# Patient Record
Sex: Female | Born: 1935 | Race: White | Hispanic: No | Smoking: Former smoker
Health system: Southern US, Community
[De-identification: ages and names within clinical notes are randomized; demographics above are authoritative.]

## PROBLEM LIST (undated history)

## (undated) DIAGNOSIS — M069 Rheumatoid arthritis, unspecified: Secondary | ICD-10-CM

## (undated) DIAGNOSIS — I639 Cerebral infarction, unspecified: Secondary | ICD-10-CM

## (undated) DIAGNOSIS — I4891 Unspecified atrial fibrillation: Secondary | ICD-10-CM

## (undated) DIAGNOSIS — IMO0002 Reserved for concepts with insufficient information to code with codable children: Secondary | ICD-10-CM

## (undated) DIAGNOSIS — J449 Chronic obstructive pulmonary disease, unspecified: Secondary | ICD-10-CM

## (undated) DIAGNOSIS — J40 Bronchitis, not specified as acute or chronic: Secondary | ICD-10-CM

## (undated) DIAGNOSIS — I509 Heart failure, unspecified: Secondary | ICD-10-CM

## (undated) DIAGNOSIS — I10 Essential (primary) hypertension: Secondary | ICD-10-CM

## (undated) HISTORY — PX: HERNIA REPAIR: SHX51

## (undated) HISTORY — DX: Essential (primary) hypertension: I10

## (undated) HISTORY — DX: Rheumatoid arthritis, unspecified: M06.9

## (undated) HISTORY — PX: CHOLECYSTECTOMY: SHX55

## (undated) HISTORY — PX: KNEE SURGERY: SHX244

## (undated) HISTORY — DX: Reserved for concepts with insufficient information to code with codable children: IMO0002

---

## 2002-02-28 ENCOUNTER — Encounter: Admission: RE | Admit: 2002-02-28 | Discharge: 2002-02-28 | Payer: Self-pay | Admitting: General Surgery

## 2002-02-28 ENCOUNTER — Encounter: Payer: Self-pay | Admitting: General Surgery

## 2002-09-12 ENCOUNTER — Encounter: Payer: Self-pay | Admitting: General Surgery

## 2002-09-12 ENCOUNTER — Encounter: Admission: RE | Admit: 2002-09-12 | Discharge: 2002-09-12 | Payer: Self-pay | Admitting: General Surgery

## 2003-02-19 ENCOUNTER — Encounter: Payer: Self-pay | Admitting: General Surgery

## 2003-02-19 ENCOUNTER — Encounter: Admission: RE | Admit: 2003-02-19 | Discharge: 2003-02-19 | Payer: Self-pay | Admitting: General Surgery

## 2004-01-09 ENCOUNTER — Ambulatory Visit (HOSPITAL_COMMUNITY): Admission: RE | Admit: 2004-01-09 | Discharge: 2004-01-09 | Payer: Self-pay | Admitting: Internal Medicine

## 2004-05-20 ENCOUNTER — Encounter: Admission: RE | Admit: 2004-05-20 | Discharge: 2004-05-20 | Payer: Self-pay | Admitting: Internal Medicine

## 2005-06-01 ENCOUNTER — Encounter: Admission: RE | Admit: 2005-06-01 | Discharge: 2005-06-01 | Payer: Self-pay | Admitting: Internal Medicine

## 2006-03-19 ENCOUNTER — Encounter: Admission: RE | Admit: 2006-03-19 | Discharge: 2006-03-19 | Payer: Self-pay | Admitting: Internal Medicine

## 2006-06-02 ENCOUNTER — Encounter: Admission: RE | Admit: 2006-06-02 | Discharge: 2006-06-02 | Payer: Self-pay | Admitting: Internal Medicine

## 2006-08-26 ENCOUNTER — Inpatient Hospital Stay (HOSPITAL_COMMUNITY): Admission: EM | Admit: 2006-08-26 | Discharge: 2006-08-31 | Payer: Self-pay | Admitting: Emergency Medicine

## 2006-08-26 ENCOUNTER — Encounter (INDEPENDENT_AMBULATORY_CARE_PROVIDER_SITE_OTHER): Payer: Self-pay | Admitting: Specialist

## 2006-08-30 ENCOUNTER — Ambulatory Visit: Payer: Self-pay | Admitting: Internal Medicine

## 2007-06-06 ENCOUNTER — Encounter: Admission: RE | Admit: 2007-06-06 | Discharge: 2007-06-06 | Payer: Self-pay | Admitting: Internal Medicine

## 2009-10-14 ENCOUNTER — Encounter: Admission: RE | Admit: 2009-10-14 | Discharge: 2009-11-04 | Payer: Self-pay | Admitting: Internal Medicine

## 2010-12-19 NOTE — Op Note (Signed)
Debbie Kramer, Debbie Kramer              ACCOUNT NO.:  [id number]   MEDICAL RECORD NO.:  [id number]          PATIENT TYPE:  INP   LOCATION:  5705                         FACILITY:  [facility]   PHYSICIAN:  Wilmon Arms. Corliss Skains, M.D. DATE OF BIRTH:  [birth year]   DATE OF PROCEDURE:  08/26/2006  DATE OF DISCHARGE:                               OPERATIVE REPORT   PREOPERATIVE DIAGNOSIS:  Acute cholecystitis.   POSTOPERATIVE DIAGNOSES:  1. Acute cholecystitis.  2. Choledocholithiasis.   PROCEDURE PERFORMED:  Laparoscopic cholecystectomy with intraoperative  cholangiogram.   SURGEON:  Wilmon Arms. Corliss Skains, M.D.   SUMMARYMarcial Pacas E. Earlene Plater, M.D.   ANESTHESIA:  General endotracheal.   INDICATIONS:  The patient is a 77 year old female who presents with  acute onset of right upper quadrant pain, nausea and vomiting beginning  at about by 3 p.m. yesterday after eating [employer] doughnuts.  She  was evaluated in the emergency department.  An ultrasound showed  cholelithiasis as well as some wall thickening concerning for  cholecystitis.  Her white count was elevated at 14.  Her liver function  tests were normal.  She was admitted to the hospital, placed on  intravenous antibiotics and consented for a laparoscopic  cholecystectomy.   DESCRIPTION OF PROCEDURE:  The patient was brought to the operating room  and placed in the supine position on the operating room table.  After an  adequate level of general anesthesia was obtained, the patient's abdomen  was prepped with Betadine and draped in a sterile fashion.  A time-out  was taken to assure the proper patient and proper procedure.  The area  just above her umbilicus was infiltrated with 0.25% Marcaine.  A  transverse incision was made here.  Dissection was carried down to the  fascia, which was opened vertically.  The peritoneal cavity was entered  bluntly.  There were omental adhesions below the wound from her previous  abdominal hysterectomy.   However, we were able to clear spot into the  right upper quadrant.  The Hasson cannula was inserted and secured with  a stay suture of 0 Vicryl placed in pursestring fashion around the  fascial opening.  Pneumoperitoneum was obtained by insufflating CO2 and  maintaining a maximum pressure of 15 mmHg.  The laparoscope was inserted  into the right upper quadrant.  An inflamed gallbladder was identified  with adherent omentum.  The liver appeared grossly normal.  A 10 mm port  was placed in a subxiphoid position and two 5-mm ports placed in the  right upper quadrant.  The patient was positioned in reverse  Trendelenburg position tilted and slightly to her left.  The gallbladder  was decompressed with the Nezhat aspiration device.  The gallbladder was  grasped with a clamp and elevated off of the edge of the liver.  We  bluntly dissected the adhesions away from the surface of the  gallbladder.  The duodenum was also bluntly dissected away from the  gallbladder.  There was a large stone impacted in the neck of the  gallbladder.  We were able to dissect around the cystic duct  just below  this stone.  Once we had circumferentially dissected around the cystic  duct, we ligated with a clip.  A small opening was created on the cystic  duct just below the clip.  A Cook cholangiogram catheter was inserted  through a stab incision and threaded into the cystic duct.  It was  secured with a clip.  The cholangiogram was obtained.  This showed good  flow into the common bile duct but there was a filling defect which was  identified, which lodged at the ampulla.  Contrast flowed proximally to  the biliary tree but it appeared that there was a filling defect at the  takeoff of the left branch of the hepatic duct.  Contrast flowed into  the right to the side.  Contrast was never seen entering the duodenum.  The catheter was then removed.  The cystic duct was ligated with clips  and divided.  The cystic  artery was also ligated with clips and divided.  Cautery was then used to remove the gallbladder from the liver bed.  This was very difficult due to the edema and inflamed gallbladder.  The  gallbladder was placed in an EndoCatch sac.  We irrigated the right  upper quadrant.  The gallbladder fossa was thoroughly cauterized for  hemostasis.  The irrigant was suctioned out.  The gallbladder was then  removed through the umbilical port site.  We had to enlarge the fascia  slightly to allow removal of the very large gallbladder filled with  stones.  The fascia was closed with a stay suture.  Monocryl 4-0 was  used to close all the skin incisions after releasing pneumoperitoneum  and removing the port sites.  Steri-Strips and clean dressings were  applied.  The patient was extubated and brought to recovery in stable  condition.  We will consult gastroenterology for a postoperative ERCP to  clear the common bile duct.      Wilmon Arms. Tsuei, M.D.  Electronically Signed     MKT/MEDQ  D:  08/26/2006  T:  08/26/2006  Job:  [id number]

## 2010-12-19 NOTE — Discharge Summary (Signed)
NAMETRICA, USERY NO.:  [id number]   MEDICAL RECORD NO.:  [id number]          PATIENT TYPE:  INP   LOCATION:  5705                         FACILITY:  [facility]   PHYSICIAN:  Leonie Man, M.D.   DATE OF BIRTH:  05/29/1926   DATE OF ADMISSION:  08/25/2006  DATE OF DISCHARGE:  08/31/2006                               DISCHARGE SUMMARY   DISCHARGING PHYSICIAN:  Dr. Lurene Shadow.   CHIEF COMPLAINT/REASON FOR ADMISSION:  Ms. Sandiford is a 77 year old  female patient, developed abdominal pain in the right upper abdomen, not  relieved with Tums.  She began having nausea and vomiting.  She  presented to the ER at 8:00 p.m. on the 22nd.  Evaluation there by the  ER physician was consistent with probable acute cholecystitis.  White  count was elevated at 14,000 with a left shift.  Her lipase was normal.  LFTs were pending at time of admission.  An ultrasound was done that  showed the gallbladder wall thickening and cholelithiasis and possible  cholecystitis, without any ductal dilatation.  On exam, she was  afebrile.  Her abdomen was soft without any herniation.  She had  tenderness and guarding in the right upper quadrant.  She was admitted  by Dr. Michaell Cowing with the following diagnoses.  1. Probable acute cholecystitis with cholelithiasis.  2. History of hypertension.  3. History of tobacco abuse.  4. Prior multiple abdominal surgeries.   HOSPITAL COURSE:  The patient was admitted by Dr. Michaell Cowing on the 23rd late  in the evening and was admitted to the surgical floor on August 26, 2006.  She was taken to the OR by Dr. Manus Rudd, with a preoperative  diagnosis of acute cholecystitis, postoperative diagnosis acute  cholecystitis and choledocholithiasis.  She underwent a laparoscopic  cholecystectomy with an intraoperative cholangiogram showing stones in  the common bile duct.  Because of this, gastroenterology consult was  obtained in the preoperative period, as well as the  postoperative  period.  The patient had been started on Unisom IV for cholecystitis.  She was also given Dilaudid and Toradol for pain.   On August 26, 2006 in the afternoon, GI evaluated the patient, but the  patient was appropriate for ERCP procedure, and on August 27, 2006, the  patient did undergo ERCP.  They were unsuccessful on the first attempt  on August 27, 2006, so patient remained in the hospital.  The limited  films from the initial ERCP showed that there was still possibly some  sort of density or object/mass within the common bile duct; therefore an  MRCP was ordered.  This showed a stone and a filling defect in the  common bile duct.  Therefore, the ERCP was repeated on August 30, 2006.  This time, they was successful with the biliary sphincterotomy and  sweeping of the bile duct stone.  The patient tolerated the procedure  well.  Diet was advanced, and by August 31, 2006 the patient was deemed  appropriate for discharge home by gastroenterology services.  Her BMET  was normal.  Her AST was mildly elevated at  84 with an ALT of 112.  White count was 7,700.  Follow up with gastroenterology as needed.   DISCHARGE DIAGNOSES:  1. Acute cholecystitis with cholelithiasis.  2. Status post laparoscopic cholecystectomy with abnormal      intraoperative cholangiogram.  3. Choledocholithiasis status post ERCP with sphincterotomy and stone      retrieval.  4. Hypertension, stable.  5. Leukocytosis, resolved.   DISCHARGE MEDICATIONS:  1. The patient will resume the home medications.  2. Vicodin as needed for pain.   DISCHARGE INSTRUCTIONS:  The patient has been given the [facility] home care instructions for laparoscopic cholecystectomy,  and she will be following these instructions as written.  She is to  follow up with Dr. Corliss Skains in 2-3 weeks in the office.  She needs to call  for appointment.      Allison L. Rennis Harding, N.P.      Leonie Man, M.D.   Electronically Signed    ALE/MEDQ  D:  10/08/2006  T:  10/08/2006  Job:  [id number]   cc:   Rachael Fee, MD  Gaspar Garbe, M.D.

## 2010-12-19 NOTE — H&P (Signed)
Debbie Kramer, Debbie Kramer NO.:  [id number]   MEDICAL RECORD NO.:  [id number]          PATIENT TYPE:  INP   LOCATION:  5705                         FACILITY:  [facility]   PHYSICIAN:  Ardeth Sportsman, MD     DATE OF BIRTH:  25-Sep-1925   DATE OF ADMISSION:  08/25/2006  DATE OF DISCHARGE:                              HISTORY & PHYSICAL   PRIMARY CARE PHYSICIAN:  Guerry Bruin, M.D.   REQUESTING PHYSICIAN:  Dione Booze, M.D.   SURGEON:  Karie Soda, M.D., for Wilmon Arms. Tsuei, M.D.   CHIEF COMPLAINT:  Abdominal pain, probable cholecystitis.   HISTORY OF PRESENT ILLNESS:  Debbie Kramer is a pleasant 77 year old  female, who is otherwise pretty healthy, who has never had really any  significant abdominal pain before in the past.  She noted yesterday  after having somewhat of a heavy meal in the afternoon around 3:00  (doughnut as well as a clam chowder) that she started to have some  abdominal pain.  She notes that is primarily in the upper abdomen, right  greater than left and somewhat focused epigastric as well.  The pain  worsened.  She took four Tums with no help.  She tried some Pepto-  Bismol, threw that up, felt nauseated, and did some dry heaving.  No  hematemesis, no hematochezia or melena.  No diarrhea.  Normally, she has  a bowel movement about every day.  No sick contacts or travel history.  No other significant change in her diet.  She has never had anything  like this before.  No history really of any significant reflux or  heartburn or hiatal hernia that she can recall.   Because the pain did not improve, she came to the ER around 8:00 last  night, was evaluated, and the concern was that she has cholecystitis.  Her abdominal pain, although improved with pain medication, has not  totally gone away.   PAST MEDICAL HISTORY:  1. Hypertension .  2. Osteoarthritis.  3. Osteoporosis.  4. Question of diverticulitis in the past.  She has never had a  colonoscopy.  5. Tobacco abuse.  6. It sounds like she had had worms when she was a child in the      gastroenterological system, but she has not had any problems like      that in over 70 years.   PAST SURGICAL HISTORY:  1. She had an abdominal hysterectomy about 35 years ago.  2. She had a right total knee replacement about 12 years ago.  3. A left total knee replacement about 10 or 11 years ago.   SOCIAL HISTORY:  She has probably about a 40-pack year history of  tobacco and currently smokes about half a pack per day.  No other  alcohol or drug use.  She is married.  I think she is the primary  caregiver for her husband, who apparently has some increased health  needs.  Her daughter is here today with her.   FAMILY HISTORY:  She cannot recall any significant gallbladder, foregut,  or gastrointestinal problems in her family.  Otherwise, no definite  history of any diabetes, but she thinks her parents had heart troubles.   ALLERGIES:  None.   MEDICATIONS:  She takes benazepril, calcium, Celebrex, Enbrel, Fosamax,  multivitamins, and Tylenol.  She denies taking any aspirin, Plavix,  Coumadin, or other blood thinners.  Denies taking any herbal  supplements.   REVIEW OF SYSTEMS:  CONSTITUTIONAL:  No fevers, chills or sweats.  Otherwise, negative.  OPHTHALMOLOGIC:  She wears glasses, but that has  not changed at all recently.  Her vision has been relatively stable.  Otherwise, negative.  HEENT is essentially negative.  CARDIAC:  She can  walk at least a couple of blocks.  No exertional chest pain or angina.  She does not try to use stairs very much.  She is usually pretty  physically active, out doing daily stuff and taking care of her husband.  Otherwise, cardiac is negative.  RESPIRATORY:  No colds, coughs or flus.  No hemoptysis or productive sputum.  Otherwise, negative.  GI:  As noted  above.  Otherwise, negative.  GU is negative for any significant urinary  symptoms.  GYN:   Status post hysterectomy.  No vaginal bleeding or  discharge.  HEME/LYMPH:  Negative.  PSYCH:  Negative.  NEUROLOGICAL:  Negative.  SKIN:  Negative.  HEPATIC/ RENAL/ENDOCRINE:  All negative.   VITAL SIGNS:  Her temperature is 98.6.  Blood pressure of initially  207/89, now 138/72.  Initial pain was around 7/10 and now it is down to  about 2-3/10.  It is not 0/10 as the ER notes.  She is 91% on room air.  Respirations around 18.   PHYSICAL EXAM:  GENERAL:  She is a well-developed, well-nourished  female.  She does not look toxic and does smile, but she is not totally  comfortable.  PSYCH:  She is pleasant and interactive with no evidence of any  dementia, psychosis or paranoia.  EYES:  Pupils equal round, reactive to light.  Extraocular movements are  intact.  Sclerae not particularly jaundiced or icteric or injected.  HEENT:  She is normocephalic.  Her mucous membranes are dry, but her  nasopharynx and oropharynx are clear.  She has no facial asymmetry.  NECK:  Supple without any masses.  Trachea is midline.  No obvious  thyroid masses.  HEART:  Regular rate and rhythm.  No murmurs, clicks, rubs.  She has no  JVD.  She has no pedal edema.  LUNGS:  Clear to auscultation bilaterally.  No wheezes, rales, rhonchi.  No significant pain on rib or sternal compression.  ABDOMEN:  Soft, not particularly distended.  She has no umbilical  hernia.  She does have pretty good tenderness and guarding in her right  upper quadrant, especially unilaterally.  She does not have the classic  Murphy sign, but she is pretty close to it.  GENITAL:  Normal external female genitalia.  No inguinal hernias.  Rectal is deferred per patient request.  MUSCULOSKELETAL:  She has incisions over both knees consistent with open  total knee replacements, but otherwise she has full range of motion of  her shoulders, elbows, wrists as well as hips, knees, and ankles.  No significant pain along her cervical, thoraco,  lumbosacral spine.  SKIN:  No obvious petechiae.  No purpura .  No major sores or lesions.  LYMPH:  No head, neck, axillary, or groin lymphadenopathy.   STUDIES:  Her white count is 14, which is elevated and she does have a  left-sided  shift.  Hemoglobin of 14.7.  Chemistries are within normal  range.  Potassium is 3.7, creatinine of 0.7.  I do not have a total  bilirubin on her, but her albumin is 3.9 and lipase is normal.   X-ray:  She has a 3-way of the abdomen, which shows her lungs are pretty  clear and unremarkable and her abdominal films are unremarkable as well.  She has an ultrasound, which preliminary report shows gallbladder wall  thickening and stones concerning for cholecystitis.  I do not see any  obvious pericholecystic fluid, and the common bile duct does not appear  to be dilated.   ASSESSMENT AND PLAN:  This is a 77 year old female with probable acute  cholecystitis.   I had discussions with the patient as well as the patient's daughter.  The anatomy and physiology of hepatobiliary and pancreatic function was  explained.  Path of physiology of cholecystolithiasis with its risks of  cholecystitis, gallstones, pancreatitis, etc., were explained.  Given  her history and exam and the fact that pain has persisted over 12 hours  since her attack, she does have elevated white, and her ultrasound is  concerning as well, I believe she has cholecystitis.  Options were  discussed and given that I think she would tolerate a surgery well and  just does not have any immediate contraindications, a recommendation is  made for laparoscopic cholecystectomy with possible intraoperative  cholangiogram.   Risks such as stroke, heart attack, deep venous thrombosis, pulmonary  embolism, and death are discussed.  Risks such as bleeding, need for  transfusion, hematoma, ecchymosis, wound infection, abscess, injury to  other organs, incisional hernia, bile duct injury resulting in need of   internal/external drainage and/or operative reconstruction, and other  risks were discussed.  Overall, I think the benefits of having her  gallbladder removed in a timely fashion outweigh the risks.  They were  hoping to may be delay this, but after talking with them and noting that  pain persists and she has multiple signs and exam is concerning for  cholecystitis, she and the daughter agreed to get it done.   PLAN:  We will, therefore:  1. Admit.  2. Intravenous Unasyn.  3. EKG for completion of workup  4. Laparoscopic cholecystectomy as noted above.      Ardeth Sportsman, MD  Electronically Signed     SCG/MEDQ  D:  08/26/2006  T:  08/26/2006  Job:  [id number]

## 2011-03-04 ENCOUNTER — Encounter: Payer: Self-pay | Admitting: Podiatry

## 2011-03-04 DIAGNOSIS — IMO0002 Reserved for concepts with insufficient information to code with codable children: Secondary | ICD-10-CM

## 2011-03-04 DIAGNOSIS — M069 Rheumatoid arthritis, unspecified: Secondary | ICD-10-CM | POA: Insufficient documentation

## 2011-03-04 DIAGNOSIS — M1990 Unspecified osteoarthritis, unspecified site: Secondary | ICD-10-CM | POA: Insufficient documentation

## 2011-09-17 DIAGNOSIS — M069 Rheumatoid arthritis, unspecified: Secondary | ICD-10-CM | POA: Diagnosis not present

## 2011-09-17 DIAGNOSIS — M159 Polyosteoarthritis, unspecified: Secondary | ICD-10-CM | POA: Diagnosis not present

## 2011-09-17 DIAGNOSIS — M810 Age-related osteoporosis without current pathological fracture: Secondary | ICD-10-CM | POA: Diagnosis not present

## 2011-09-17 DIAGNOSIS — Z79899 Other long term (current) drug therapy: Secondary | ICD-10-CM | POA: Diagnosis not present

## 2011-09-17 DIAGNOSIS — M545 Low back pain: Secondary | ICD-10-CM | POA: Diagnosis not present

## 2011-10-02 DIAGNOSIS — E785 Hyperlipidemia, unspecified: Secondary | ICD-10-CM | POA: Diagnosis not present

## 2011-10-02 DIAGNOSIS — J069 Acute upper respiratory infection, unspecified: Secondary | ICD-10-CM | POA: Diagnosis not present

## 2011-10-02 DIAGNOSIS — I10 Essential (primary) hypertension: Secondary | ICD-10-CM | POA: Diagnosis not present

## 2011-10-02 DIAGNOSIS — M810 Age-related osteoporosis without current pathological fracture: Secondary | ICD-10-CM | POA: Diagnosis not present

## 2011-12-14 DIAGNOSIS — L570 Actinic keratosis: Secondary | ICD-10-CM | POA: Diagnosis not present

## 2012-03-16 DIAGNOSIS — M25529 Pain in unspecified elbow: Secondary | ICD-10-CM | POA: Diagnosis not present

## 2012-03-16 DIAGNOSIS — M159 Polyosteoarthritis, unspecified: Secondary | ICD-10-CM | POA: Diagnosis not present

## 2012-03-16 DIAGNOSIS — M810 Age-related osteoporosis without current pathological fracture: Secondary | ICD-10-CM | POA: Diagnosis not present

## 2012-03-16 DIAGNOSIS — M545 Low back pain: Secondary | ICD-10-CM | POA: Diagnosis not present

## 2012-03-16 DIAGNOSIS — M069 Rheumatoid arthritis, unspecified: Secondary | ICD-10-CM | POA: Diagnosis not present

## 2012-04-06 DIAGNOSIS — E785 Hyperlipidemia, unspecified: Secondary | ICD-10-CM | POA: Diagnosis not present

## 2012-04-06 DIAGNOSIS — I10 Essential (primary) hypertension: Secondary | ICD-10-CM | POA: Diagnosis not present

## 2012-04-06 DIAGNOSIS — M810 Age-related osteoporosis without current pathological fracture: Secondary | ICD-10-CM | POA: Diagnosis not present

## 2012-04-13 DIAGNOSIS — Z0000 Encounter for general adult medical examination without abnormal findings: Secondary | ICD-10-CM | POA: Diagnosis not present

## 2012-04-13 DIAGNOSIS — I10 Essential (primary) hypertension: Secondary | ICD-10-CM | POA: Diagnosis not present

## 2012-04-13 DIAGNOSIS — E785 Hyperlipidemia, unspecified: Secondary | ICD-10-CM | POA: Diagnosis not present

## 2012-04-13 DIAGNOSIS — E875 Hyperkalemia: Secondary | ICD-10-CM | POA: Diagnosis not present

## 2012-04-13 DIAGNOSIS — Z23 Encounter for immunization: Secondary | ICD-10-CM | POA: Diagnosis not present

## 2012-04-14 DIAGNOSIS — Z1212 Encounter for screening for malignant neoplasm of rectum: Secondary | ICD-10-CM | POA: Diagnosis not present

## 2012-08-02 DIAGNOSIS — Z79899 Other long term (current) drug therapy: Secondary | ICD-10-CM | POA: Diagnosis not present

## 2012-09-20 DIAGNOSIS — M069 Rheumatoid arthritis, unspecified: Secondary | ICD-10-CM | POA: Diagnosis not present

## 2012-09-20 DIAGNOSIS — M810 Age-related osteoporosis without current pathological fracture: Secondary | ICD-10-CM | POA: Diagnosis not present

## 2012-09-20 DIAGNOSIS — M545 Low back pain: Secondary | ICD-10-CM | POA: Diagnosis not present

## 2012-09-20 DIAGNOSIS — M159 Polyosteoarthritis, unspecified: Secondary | ICD-10-CM | POA: Diagnosis not present

## 2012-09-30 DIAGNOSIS — M069 Rheumatoid arthritis, unspecified: Secondary | ICD-10-CM | POA: Diagnosis not present

## 2012-10-12 DIAGNOSIS — R809 Proteinuria, unspecified: Secondary | ICD-10-CM | POA: Diagnosis not present

## 2012-10-12 DIAGNOSIS — M810 Age-related osteoporosis without current pathological fracture: Secondary | ICD-10-CM | POA: Diagnosis not present

## 2012-10-12 DIAGNOSIS — Z79899 Other long term (current) drug therapy: Secondary | ICD-10-CM | POA: Diagnosis not present

## 2012-10-12 DIAGNOSIS — I452 Bifascicular block: Secondary | ICD-10-CM | POA: Diagnosis not present

## 2012-10-12 DIAGNOSIS — N182 Chronic kidney disease, stage 2 (mild): Secondary | ICD-10-CM | POA: Diagnosis not present

## 2012-10-12 DIAGNOSIS — E785 Hyperlipidemia, unspecified: Secondary | ICD-10-CM | POA: Diagnosis not present

## 2012-10-12 DIAGNOSIS — Z1331 Encounter for screening for depression: Secondary | ICD-10-CM | POA: Diagnosis not present

## 2012-10-12 DIAGNOSIS — M069 Rheumatoid arthritis, unspecified: Secondary | ICD-10-CM | POA: Diagnosis not present

## 2012-10-17 DIAGNOSIS — M069 Rheumatoid arthritis, unspecified: Secondary | ICD-10-CM | POA: Diagnosis not present

## 2012-11-15 DIAGNOSIS — M069 Rheumatoid arthritis, unspecified: Secondary | ICD-10-CM | POA: Diagnosis not present

## 2013-01-10 DIAGNOSIS — M069 Rheumatoid arthritis, unspecified: Secondary | ICD-10-CM | POA: Diagnosis not present

## 2013-03-06 DIAGNOSIS — M069 Rheumatoid arthritis, unspecified: Secondary | ICD-10-CM | POA: Diagnosis not present

## 2013-03-22 DIAGNOSIS — M545 Low back pain: Secondary | ICD-10-CM | POA: Diagnosis not present

## 2013-03-22 DIAGNOSIS — M810 Age-related osteoporosis without current pathological fracture: Secondary | ICD-10-CM | POA: Diagnosis not present

## 2013-03-22 DIAGNOSIS — M159 Polyosteoarthritis, unspecified: Secondary | ICD-10-CM | POA: Diagnosis not present

## 2013-03-22 DIAGNOSIS — M069 Rheumatoid arthritis, unspecified: Secondary | ICD-10-CM | POA: Diagnosis not present

## 2013-04-07 DIAGNOSIS — I10 Essential (primary) hypertension: Secondary | ICD-10-CM | POA: Diagnosis not present

## 2013-04-07 DIAGNOSIS — E785 Hyperlipidemia, unspecified: Secondary | ICD-10-CM | POA: Diagnosis not present

## 2013-04-14 DIAGNOSIS — I10 Essential (primary) hypertension: Secondary | ICD-10-CM | POA: Diagnosis not present

## 2013-04-14 DIAGNOSIS — M069 Rheumatoid arthritis, unspecified: Secondary | ICD-10-CM | POA: Diagnosis not present

## 2013-04-14 DIAGNOSIS — R7309 Other abnormal glucose: Secondary | ICD-10-CM | POA: Diagnosis not present

## 2013-04-14 DIAGNOSIS — E785 Hyperlipidemia, unspecified: Secondary | ICD-10-CM | POA: Diagnosis not present

## 2013-04-14 DIAGNOSIS — I452 Bifascicular block: Secondary | ICD-10-CM | POA: Diagnosis not present

## 2013-04-14 DIAGNOSIS — Z23 Encounter for immunization: Secondary | ICD-10-CM | POA: Diagnosis not present

## 2013-04-14 DIAGNOSIS — Z0000 Encounter for general adult medical examination without abnormal findings: Secondary | ICD-10-CM | POA: Diagnosis not present

## 2013-04-14 DIAGNOSIS — M810 Age-related osteoporosis without current pathological fracture: Secondary | ICD-10-CM | POA: Diagnosis not present

## 2013-04-14 DIAGNOSIS — R809 Proteinuria, unspecified: Secondary | ICD-10-CM | POA: Diagnosis not present

## 2013-04-17 DIAGNOSIS — Z1212 Encounter for screening for malignant neoplasm of rectum: Secondary | ICD-10-CM | POA: Diagnosis not present

## 2013-05-02 DIAGNOSIS — M069 Rheumatoid arthritis, unspecified: Secondary | ICD-10-CM | POA: Diagnosis not present

## 2013-06-27 DIAGNOSIS — M069 Rheumatoid arthritis, unspecified: Secondary | ICD-10-CM | POA: Diagnosis not present

## 2013-07-20 DIAGNOSIS — M810 Age-related osteoporosis without current pathological fracture: Secondary | ICD-10-CM | POA: Diagnosis not present

## 2013-07-20 DIAGNOSIS — M545 Low back pain: Secondary | ICD-10-CM | POA: Diagnosis not present

## 2013-07-20 DIAGNOSIS — M069 Rheumatoid arthritis, unspecified: Secondary | ICD-10-CM | POA: Diagnosis not present

## 2013-07-20 DIAGNOSIS — M159 Polyosteoarthritis, unspecified: Secondary | ICD-10-CM | POA: Diagnosis not present

## 2013-08-22 DIAGNOSIS — M069 Rheumatoid arthritis, unspecified: Secondary | ICD-10-CM | POA: Diagnosis not present

## 2013-10-16 DIAGNOSIS — Z79899 Other long term (current) drug therapy: Secondary | ICD-10-CM | POA: Diagnosis not present

## 2013-10-16 DIAGNOSIS — E785 Hyperlipidemia, unspecified: Secondary | ICD-10-CM | POA: Diagnosis not present

## 2013-10-16 DIAGNOSIS — M810 Age-related osteoporosis without current pathological fracture: Secondary | ICD-10-CM | POA: Diagnosis not present

## 2013-10-16 DIAGNOSIS — Z1331 Encounter for screening for depression: Secondary | ICD-10-CM | POA: Diagnosis not present

## 2013-10-16 DIAGNOSIS — N182 Chronic kidney disease, stage 2 (mild): Secondary | ICD-10-CM | POA: Diagnosis not present

## 2013-10-16 DIAGNOSIS — M069 Rheumatoid arthritis, unspecified: Secondary | ICD-10-CM | POA: Diagnosis not present

## 2013-10-16 DIAGNOSIS — R809 Proteinuria, unspecified: Secondary | ICD-10-CM | POA: Diagnosis not present

## 2013-10-16 DIAGNOSIS — I10 Essential (primary) hypertension: Secondary | ICD-10-CM | POA: Diagnosis not present

## 2013-10-17 DIAGNOSIS — M069 Rheumatoid arthritis, unspecified: Secondary | ICD-10-CM | POA: Diagnosis not present

## 2013-11-08 DIAGNOSIS — M545 Low back pain, unspecified: Secondary | ICD-10-CM | POA: Diagnosis not present

## 2013-11-08 DIAGNOSIS — M069 Rheumatoid arthritis, unspecified: Secondary | ICD-10-CM | POA: Diagnosis not present

## 2013-11-08 DIAGNOSIS — M159 Polyosteoarthritis, unspecified: Secondary | ICD-10-CM | POA: Diagnosis not present

## 2013-11-08 DIAGNOSIS — M810 Age-related osteoporosis without current pathological fracture: Secondary | ICD-10-CM | POA: Diagnosis not present

## 2013-12-12 DIAGNOSIS — M069 Rheumatoid arthritis, unspecified: Secondary | ICD-10-CM | POA: Diagnosis not present

## 2014-02-06 DIAGNOSIS — M069 Rheumatoid arthritis, unspecified: Secondary | ICD-10-CM | POA: Diagnosis not present

## 2014-02-07 DIAGNOSIS — M545 Low back pain, unspecified: Secondary | ICD-10-CM | POA: Diagnosis not present

## 2014-02-07 DIAGNOSIS — M810 Age-related osteoporosis without current pathological fracture: Secondary | ICD-10-CM | POA: Diagnosis not present

## 2014-02-07 DIAGNOSIS — M069 Rheumatoid arthritis, unspecified: Secondary | ICD-10-CM | POA: Diagnosis not present

## 2014-02-07 DIAGNOSIS — M159 Polyosteoarthritis, unspecified: Secondary | ICD-10-CM | POA: Diagnosis not present

## 2014-03-22 DIAGNOSIS — H35369 Drusen (degenerative) of macula, unspecified eye: Secondary | ICD-10-CM | POA: Diagnosis not present

## 2014-03-22 DIAGNOSIS — H2510 Age-related nuclear cataract, unspecified eye: Secondary | ICD-10-CM | POA: Diagnosis not present

## 2014-03-22 DIAGNOSIS — H43819 Vitreous degeneration, unspecified eye: Secondary | ICD-10-CM | POA: Diagnosis not present

## 2014-04-03 DIAGNOSIS — M069 Rheumatoid arthritis, unspecified: Secondary | ICD-10-CM | POA: Diagnosis not present

## 2014-04-16 DIAGNOSIS — E785 Hyperlipidemia, unspecified: Secondary | ICD-10-CM | POA: Diagnosis not present

## 2014-04-16 DIAGNOSIS — R809 Proteinuria, unspecified: Secondary | ICD-10-CM | POA: Diagnosis not present

## 2014-04-16 DIAGNOSIS — I10 Essential (primary) hypertension: Secondary | ICD-10-CM | POA: Diagnosis not present

## 2014-04-16 DIAGNOSIS — M810 Age-related osteoporosis without current pathological fracture: Secondary | ICD-10-CM | POA: Diagnosis not present

## 2014-04-23 DIAGNOSIS — E785 Hyperlipidemia, unspecified: Secondary | ICD-10-CM | POA: Diagnosis not present

## 2014-04-23 DIAGNOSIS — M810 Age-related osteoporosis without current pathological fracture: Secondary | ICD-10-CM | POA: Diagnosis not present

## 2014-04-23 DIAGNOSIS — R809 Proteinuria, unspecified: Secondary | ICD-10-CM | POA: Diagnosis not present

## 2014-04-23 DIAGNOSIS — Z1331 Encounter for screening for depression: Secondary | ICD-10-CM | POA: Diagnosis not present

## 2014-04-23 DIAGNOSIS — I452 Bifascicular block: Secondary | ICD-10-CM | POA: Diagnosis not present

## 2014-04-23 DIAGNOSIS — N182 Chronic kidney disease, stage 2 (mild): Secondary | ICD-10-CM | POA: Diagnosis not present

## 2014-04-23 DIAGNOSIS — R7301 Impaired fasting glucose: Secondary | ICD-10-CM | POA: Diagnosis not present

## 2014-04-23 DIAGNOSIS — Z23 Encounter for immunization: Secondary | ICD-10-CM | POA: Diagnosis not present

## 2014-04-23 DIAGNOSIS — Z0000 Encounter for general adult medical examination without abnormal findings: Secondary | ICD-10-CM | POA: Diagnosis not present

## 2014-04-23 DIAGNOSIS — Z79899 Other long term (current) drug therapy: Secondary | ICD-10-CM | POA: Diagnosis not present

## 2014-04-25 DIAGNOSIS — Z1212 Encounter for screening for malignant neoplasm of rectum: Secondary | ICD-10-CM | POA: Diagnosis not present

## 2014-05-28 DIAGNOSIS — M0589 Other rheumatoid arthritis with rheumatoid factor of multiple sites: Secondary | ICD-10-CM | POA: Diagnosis not present

## 2014-06-12 DIAGNOSIS — M150 Primary generalized (osteo)arthritis: Secondary | ICD-10-CM | POA: Diagnosis not present

## 2014-06-12 DIAGNOSIS — M810 Age-related osteoporosis without current pathological fracture: Secondary | ICD-10-CM | POA: Diagnosis not present

## 2014-06-12 DIAGNOSIS — M545 Low back pain: Secondary | ICD-10-CM | POA: Diagnosis not present

## 2014-06-12 DIAGNOSIS — M0589 Other rheumatoid arthritis with rheumatoid factor of multiple sites: Secondary | ICD-10-CM | POA: Diagnosis not present

## 2014-07-23 DIAGNOSIS — M0589 Other rheumatoid arthritis with rheumatoid factor of multiple sites: Secondary | ICD-10-CM | POA: Diagnosis not present

## 2014-09-20 DIAGNOSIS — M0589 Other rheumatoid arthritis with rheumatoid factor of multiple sites: Secondary | ICD-10-CM | POA: Diagnosis not present

## 2014-10-10 DIAGNOSIS — M810 Age-related osteoporosis without current pathological fracture: Secondary | ICD-10-CM | POA: Diagnosis not present

## 2014-10-10 DIAGNOSIS — M545 Low back pain: Secondary | ICD-10-CM | POA: Diagnosis not present

## 2014-10-10 DIAGNOSIS — M0589 Other rheumatoid arthritis with rheumatoid factor of multiple sites: Secondary | ICD-10-CM | POA: Diagnosis not present

## 2014-10-10 DIAGNOSIS — M150 Primary generalized (osteo)arthritis: Secondary | ICD-10-CM | POA: Diagnosis not present

## 2014-10-22 DIAGNOSIS — M069 Rheumatoid arthritis, unspecified: Secondary | ICD-10-CM | POA: Diagnosis not present

## 2014-10-22 DIAGNOSIS — E785 Hyperlipidemia, unspecified: Secondary | ICD-10-CM | POA: Diagnosis not present

## 2014-10-22 DIAGNOSIS — R7301 Impaired fasting glucose: Secondary | ICD-10-CM | POA: Diagnosis not present

## 2014-10-22 DIAGNOSIS — I444 Left anterior fascicular block: Secondary | ICD-10-CM | POA: Diagnosis not present

## 2014-10-22 DIAGNOSIS — N182 Chronic kidney disease, stage 2 (mild): Secondary | ICD-10-CM | POA: Diagnosis not present

## 2014-10-22 DIAGNOSIS — I10 Essential (primary) hypertension: Secondary | ICD-10-CM | POA: Diagnosis not present

## 2014-10-22 DIAGNOSIS — R809 Proteinuria, unspecified: Secondary | ICD-10-CM | POA: Diagnosis not present

## 2014-10-22 DIAGNOSIS — M810 Age-related osteoporosis without current pathological fracture: Secondary | ICD-10-CM | POA: Diagnosis not present

## 2014-10-23 ENCOUNTER — Emergency Department (HOSPITAL_COMMUNITY): Payer: Medicare Other

## 2014-10-23 ENCOUNTER — Encounter (HOSPITAL_COMMUNITY): Payer: Self-pay | Admitting: Neurology

## 2014-10-23 ENCOUNTER — Inpatient Hospital Stay (HOSPITAL_COMMUNITY): Payer: Medicare Other

## 2014-10-23 ENCOUNTER — Inpatient Hospital Stay (HOSPITAL_COMMUNITY)
Admission: EM | Admit: 2014-10-23 | Discharge: 2014-10-25 | DRG: 066 | Disposition: A | Discharge status: Home / Self Care | Payer: Medicare Other | Attending: Internal Medicine | Admitting: Internal Medicine

## 2014-10-23 DIAGNOSIS — Z9049 Acquired absence of other specified parts of digestive tract: Secondary | ICD-10-CM | POA: Diagnosis present

## 2014-10-23 DIAGNOSIS — Z720 Tobacco use: Secondary | ICD-10-CM | POA: Diagnosis not present

## 2014-10-23 DIAGNOSIS — R410 Disorientation, unspecified: Secondary | ICD-10-CM | POA: Diagnosis not present

## 2014-10-23 DIAGNOSIS — I639 Cerebral infarction, unspecified: Secondary | ICD-10-CM

## 2014-10-23 DIAGNOSIS — I10 Essential (primary) hypertension: Secondary | ICD-10-CM | POA: Diagnosis not present

## 2014-10-23 DIAGNOSIS — I63431 Cerebral infarction due to embolism of right posterior cerebral artery: Secondary | ICD-10-CM | POA: Diagnosis not present

## 2014-10-23 DIAGNOSIS — F17210 Nicotine dependence, cigarettes, uncomplicated: Secondary | ICD-10-CM | POA: Diagnosis present

## 2014-10-23 DIAGNOSIS — R7989 Other specified abnormal findings of blood chemistry: Secondary | ICD-10-CM | POA: Diagnosis present

## 2014-10-23 DIAGNOSIS — M069 Rheumatoid arthritis, unspecified: Secondary | ICD-10-CM | POA: Diagnosis not present

## 2014-10-23 DIAGNOSIS — R4182 Altered mental status, unspecified: Secondary | ICD-10-CM | POA: Diagnosis not present

## 2014-10-23 DIAGNOSIS — E785 Hyperlipidemia, unspecified: Secondary | ICD-10-CM | POA: Diagnosis present

## 2014-10-23 DIAGNOSIS — I638 Other cerebral infarction: Secondary | ICD-10-CM | POA: Diagnosis not present

## 2014-10-23 DIAGNOSIS — I6330 Cerebral infarction due to thrombosis of unspecified cerebral artery: Secondary | ICD-10-CM | POA: Diagnosis not present

## 2014-10-23 DIAGNOSIS — Z66 Do not resuscitate: Secondary | ICD-10-CM | POA: Diagnosis present

## 2014-10-23 DIAGNOSIS — R9431 Abnormal electrocardiogram [ECG] [EKG]: Secondary | ICD-10-CM | POA: Diagnosis not present

## 2014-10-23 LAB — URINALYSIS, ROUTINE W REFLEX MICROSCOPIC
BILIRUBIN URINE: NEGATIVE
Glucose, UA: NEGATIVE mg/dL
Hgb urine dipstick: NEGATIVE
Ketones, ur: 15 mg/dL — AB
Leukocytes, UA: NEGATIVE
Nitrite: NEGATIVE
PROTEIN: NEGATIVE mg/dL
Specific Gravity, Urine: 1.007 (ref 1.005–1.030)
UROBILINOGEN UA: 0.2 mg/dL (ref 0.0–1.0)
pH: 7 (ref 5.0–8.0)

## 2014-10-23 LAB — PROTIME-INR
INR: 1.01 (ref 0.00–1.49)
Prothrombin Time: 13.5 seconds (ref 11.6–15.2)

## 2014-10-23 LAB — CBC
HCT: 46.8 % — ABNORMAL HIGH (ref 36.0–46.0)
HEMATOCRIT: 45.4 % (ref 36.0–46.0)
HEMOGLOBIN: 15.7 g/dL — AB (ref 12.0–15.0)
Hemoglobin: 16.2 g/dL — ABNORMAL HIGH (ref 12.0–15.0)
MCH: 33.1 pg (ref 26.0–34.0)
MCH: 32.2 pg (ref 26.0–34.0)
MCHC: 34.6 g/dL (ref 30.0–36.0)
MCHC: 34.6 g/dL (ref 30.0–36.0)
MCV: 95.5 fL (ref 78.0–100.0)
MCV: 93 fL (ref 78.0–100.0)
Platelets: 234 10*3/uL (ref 150–400)
Platelets: 212 10*3/uL (ref 150–400)
RBC: 4.9 MIL/uL (ref 3.87–5.11)
RBC: 4.88 MIL/uL (ref 3.87–5.11)
RDW: 14.7 % (ref 11.5–15.5)
RDW: 14.5 % (ref 11.5–15.5)
WBC: 7.1 10*3/uL (ref 4.0–10.5)
WBC: 6.7 10*3/uL (ref 4.0–10.5)

## 2014-10-23 LAB — COMPREHENSIVE METABOLIC PANEL
ALT: 21 U/L (ref 0–35)
AST: 30 U/L (ref 0–37)
Albumin: 3.9 g/dL (ref 3.5–5.2)
Alkaline Phosphatase: 61 U/L (ref 39–117)
Anion gap: 10 (ref 5–15)
BUN: 11 mg/dL (ref 6–23)
CO2: 28 mmol/L (ref 19–32)
Calcium: 9.5 mg/dL (ref 8.4–10.5)
Chloride: 97 mmol/L (ref 96–112)
Creatinine, Ser: 0.75 mg/dL (ref 0.50–1.10)
GFR calc Af Amer: 86 mL/min — ABNORMAL LOW (ref >90)
GFR calc non Af Amer: 74 mL/min — ABNORMAL LOW (ref >90)
Glucose, Bld: 88 mg/dL (ref 70–99)
Potassium: 4.3 mmol/L (ref 3.5–5.1)
Sodium: 135 mmol/L (ref 135–145)
Total Bilirubin: 1 mg/dL (ref 0.3–1.2)
Total Protein: 7.1 g/dL (ref 6.0–8.3)

## 2014-10-23 LAB — APTT: aPTT: 33 seconds (ref 24–37)

## 2014-10-23 LAB — I-STAT TROPONIN, ED: Troponin i, poc: 0.13 ng/mL (ref 0.00–0.08)

## 2014-10-23 LAB — DIFFERENTIAL
Basophils Absolute: 0 10*3/uL (ref 0.0–0.1)
Basophils Relative: 0 % (ref 0–1)
Eosinophils Absolute: 0.2 10*3/uL (ref 0.0–0.7)
Eosinophils Relative: 2 % (ref 0–5)
Lymphocytes Relative: 31 % (ref 12–46)
Lymphs Abs: 2.2 10*3/uL (ref 0.7–4.0)
Monocytes Absolute: 0.7 10*3/uL (ref 0.1–1.0)
Monocytes Relative: 10 % (ref 3–12)
Neutro Abs: 4 10*3/uL (ref 1.7–7.7)
Neutrophils Relative %: 57 % (ref 43–77)

## 2014-10-23 LAB — GLUCOSE, CAPILLARY: GLUCOSE-CAPILLARY: 93 mg/dL (ref 70–99)

## 2014-10-23 LAB — CREATININE, SERUM
Creatinine, Ser: 0.65 mg/dL (ref 0.50–1.10)
GFR calc Af Amer: 90 mL/min — ABNORMAL LOW (ref >90)
GFR, EST NON AFRICAN AMERICAN: 78 mL/min — AB (ref >90)

## 2014-10-23 LAB — TROPONIN I: Troponin I: 0.18 ng/mL — ABNORMAL HIGH (ref <0.031)

## 2014-10-23 MED ORDER — HYDRALAZINE HCL 20 MG/ML IJ SOLN: 10.0000 mg | Freq: Four times a day (QID) | INTRAMUSCULAR | Status: DC | PRN

## 2014-10-23 MED ORDER — BENAZEPRIL HCL 20 MG PO TABS
20.0000 mg | ORAL_TABLET | Freq: Every day | ORAL | Status: DC
  Administered 2014-10-24 – 2014-10-25 (×2): 20 mg via ORAL
  Filled 2014-10-23 (×2): qty 1

## 2014-10-23 MED ORDER — ACETAMINOPHEN 650 MG RE SUPP
650.0000 mg | RECTAL | Status: DC | PRN
Start: 2014-10-23 — End: 2014-10-25
  Filled 2014-10-23: qty 1

## 2014-10-23 MED ORDER — STROKE: EARLY STAGES OF RECOVERY BOOK
Freq: Once | Status: AC
  Administered 2014-10-23: 20:00:00
  Filled 2014-10-23: qty 1

## 2014-10-23 MED ORDER — ASPIRIN 325 MG PO TABS
325.0000 mg | ORAL_TABLET | Freq: Every day | ORAL | Status: DC
  Administered 2014-10-24 – 2014-10-25 (×2): 325 mg via ORAL
  Filled 2014-10-23 (×2): qty 1

## 2014-10-23 MED ORDER — ENOXAPARIN SODIUM 40 MG/0.4ML ~~LOC~~ SOLN
40.0000 mg | SUBCUTANEOUS | Status: DC
  Administered 2014-10-24 (×2): 40 mg via SUBCUTANEOUS
  Filled 2014-10-23 (×3): qty 0.4

## 2014-10-23 MED ORDER — ASPIRIN 300 MG RE SUPP
300.0000 mg | Freq: Every day | RECTAL | Status: DC
  Administered 2014-10-24: 300 mg via RECTAL
  Filled 2014-10-23: qty 1

## 2014-10-23 MED ORDER — SENNOSIDES-DOCUSATE SODIUM 8.6-50 MG PO TABS
1.0000 | ORAL_TABLET | Freq: Every evening | ORAL | Status: DC | PRN
  Filled 2014-10-23: qty 1

## 2014-10-23 MED ORDER — SODIUM CHLORIDE 0.9 % IV SOLN
INTRAVENOUS | Status: AC
  Administered 2014-10-23: 22:00:00 via INTRAVENOUS

## 2014-10-23 MED ORDER — ACETAMINOPHEN 325 MG PO TABS: 650.0000 mg | ORAL_TABLET | ORAL | Status: DC | PRN

## 2014-10-23 NOTE — ED Provider Notes (Signed)
CSN: [id number]     Arrival date & time 10/23/14  1328 History   First MD Initiated Contact with Patient 10/23/14 1436     Chief Complaint  Patient presents with  . Altered Mental Status     (Consider location/radiation/quality/duration/timing/severity/associated sxs/prior Treatment) Patient is a 81 y.o. female presenting with altered mental status.  Altered Mental Status Presenting symptoms: confusion   Severity:  Moderate Most recent episode:  Yesterday Episode history:  Continuous Duration:  2 days Timing:  Constant Progression:  Unchanged Chronicity:  New Context comment:  Pt went to her PCP for a regular checkup.  Shortly after leaving, she became disoriented.   Associated symptoms: no abdominal pain, no fever, no nausea and no vomiting     Past Medical History  Diagnosis Date  . Rheumatoid arthritis(714.0)   . Hypertension   . Degenerative disc disease    Past Surgical History  Procedure Laterality Date  . Hernia repair    . Knee surgery    . Cholecystectomy     No family history on file. History  Substance Use Topics  . Smoking status: Current Every Day Smoker -- 0.50 packs/day for 50 years    Types: Cigarettes  . Smokeless tobacco: Not on file  . Alcohol Use: No   OB History    No data available     Review of Systems  Constitutional: Negative for fever.  Gastrointestinal: Negative for nausea, vomiting and abdominal pain.  Psychiatric/Behavioral: Positive for confusion.  All other systems reviewed and are negative.     Allergies  Review of patient's allergies indicates no known allergies.  Home Medications   Prior to Admission medications   Medication Sig Start Date End Date Taking? Authorizing Provider  alendronate (FOSAMAX) 70 MG tablet Take 70 mg by mouth once a week. Take with a full glass of water on an empty stomach.   Yes Historical Provider, MD  benazepril (LOTENSIN) 20 MG tablet Take 20 mg by mouth daily.     Yes Historical Provider, MD   Calcium Citrate-Vitamin D (CITRACAL PETITES/VITAMIN D PO) Take by mouth.     Yes Historical Provider, MD  folic acid (FOLVITE) 1 MG tablet Take 1 mg by mouth daily.   Yes Historical Provider, MD  InFLIXimab (REMICADE IV) Inject 200 mg into the vein every 8 (eight) weeks.    Yes Historical Provider, MD  meloxicam (MOBIC) 15 MG tablet Take 15 mg by mouth daily.   Yes Historical Provider, MD  methotrexate 2.5 MG tablet Take 2.5 mg by mouth once a week.   Yes Historical Provider, MD  Multiple Vitamin (MULTIVITAMIN PO) Take 1 capsule by mouth daily.    Yes Historical Provider, MD  ALENDRONATE SODIUM PO Take by mouth.      Historical Provider, MD   BP 190/81 mmHg  Pulse 88  Temp(Src) 98.1 F (36.7 C) (Oral)  Resp 28  SpO2 95% Physical Exam  Constitutional: She is oriented to person, place, and time. She appears well-developed and well-nourished. No distress.  HENT:  Head: Normocephalic and atraumatic.  Mouth/Throat: Oropharynx is clear and moist.  Eyes: Conjunctivae are normal. Pupils are equal, round, and reactive to light. No scleral icterus.  Neck: Neck supple.  Cardiovascular: Normal rate, regular rhythm, normal heart sounds and intact distal pulses.   No murmur heard. Pulmonary/Chest: Effort normal and breath sounds normal. No stridor. No respiratory distress. She has no rales.  Abdominal: Soft. Bowel sounds are normal. She exhibits no distension. There is no  tenderness.  Musculoskeletal: Normal range of motion.  Neurological: She is alert and oriented to person, place, and time. No cranial nerve deficit or sensory deficit. Coordination normal.  4+/5 strength in LUE, 5/5 strength in RUE 5/5 strength in BLE  Skin: Skin is warm and dry. No rash noted.  Psychiatric: She has a normal mood and affect. Her behavior is normal.  Nursing note and vitals reviewed.   ED Course  Procedures (including critical care time) Labs Review Labs Reviewed  CBC - Abnormal; Notable for the following:     Hemoglobin 16.2 (*)    HCT 46.8 (*)    All other components within normal limits  COMPREHENSIVE METABOLIC PANEL - Abnormal; Notable for the following:    GFR calc non Af Amer 74 (*)    GFR calc Af Amer 86 (*)    All other components within normal limits  URINALYSIS, ROUTINE W REFLEX MICROSCOPIC - Abnormal; Notable for the following:    Ketones, ur 15 (*)    All other components within normal limits  I-STAT TROPOININ, ED - Abnormal; Notable for the following:    Troponin i, poc 0.13 (*)    All other components within normal limits  PROTIME-INR  APTT  DIFFERENTIAL    Imaging Review Ct Head (brain) Wo Contrast  10/23/2014   CLINICAL DATA:  81 year old female with acute altered mental status, confusion. Initial encounter.  EXAM: CT HEAD WITHOUT CONTRAST  TECHNIQUE: Contiguous axial images were obtained from the base of the skull through the vertex without intravenous contrast.  COMPARISON:  None.  FINDINGS: Visualized paranasal sinuses and mastoids are clear. Visualized orbit soft tissues are within normal limits. Visualized scalp soft tissues are within normal limits. No acute osseous abnormality identified.  Confluent hypodensity involving gray and white-matter in the hip posterior right hemisphere, posterior right temporal lobe. No associated hemorrhage or mass effect. Calcified atherosclerosis at the skull base. No suspicious intracranial vascular hyperdensity. Possible small dural calcification on series 2, image 14 in this region, does not resemble a hyperdense vein.  Superimposed bilateral Patchy and confluent white matter hypodensity. Small linear hypodensity in the inferior cerebellum on the left (series 2, image 6). No midline shift, mass effect, or evidence of intracranial mass lesion. No acute intracranial hemorrhage identified.  IMPRESSION: 1. Acute to subacute posterior right MCA territory infarct involving the posterior right temporal lobe. No mass effect or hemorrhage. 2. Age  indeterminate bilateral small vessel disease including in the left cerebellum.   Electronically Signed   By: Genevie Ann M.D.   On: 10/23/2014 15:22  All radiology studies independently viewed by me.      EKG Interpretation   Date/Time:  Tuesday October 23 2014 14:17:53 EDT Ventricular Rate:  91 PR Interval:  164 QRS Duration: 102 QT Interval:  377 QTC Calculation: 464 R Axis:   -84 Text Interpretation:  Sinus rhythm Left anterior fascicular block Anterior  infarct, old No significant change was found Confirmed by [facility]  MD,  TREY ([id number]) on 10/23/2014 2:35:48 PM      MDM   Final diagnoses:  Cerebral infarction due to unspecified mechanism    81 yo female with mild confusion since yesterday.  Found to have CVA on head CT and elevated troponin.  Have discussed with Dr. Sloan Leiter who will admit and Dr. Nicole Kindred who will consult.  No code stroke called and no tPA given due to time since onset of symptoms.     Serita Grit, MD 10/23/14 [phone]

## 2014-10-23 NOTE — ED Notes (Signed)
Assisted patient to restroom.

## 2014-10-23 NOTE — ED Notes (Signed)
Pt reports yesterday at 12 started feeling disoriented while leaving the restaurant couldn't remember how  To get home. Today she felt she wasn't able to do her puzzle books. No weakness noted, speech is clear, equal grips. Pt is a x 4.

## 2014-10-23 NOTE — Consult Note (Signed)
Admission H&P    Chief Complaint: New onset confusion with abnormal CT scan.  HPI: Debbie Kramer is an 81 y.o. female with a history of hypertension, rheumatoid arthritis and degenerative disc disease brought to the emergency room because of confusion which was first noticed yesterday afternoon by family members. Exact time of onset is unclear. Patient has had difficulty with crossword puzzles as well as with performing simple tasks. She's also seemed more forgetful than usual. She had some difficulty with driving yesterday afternoon and last night. CT scan of the head was obtained which showed findings consistent with an acute to subacute right posterior MCA artery ischemic stroke. Patient has no previous history of stroke nor TIA. NIH stroke score was 3.   LSN: 10/22/2014, unclear time tPA Given: No: Unclear time of onset of deficits; unequivocal recent stroke on CT mRankin:  Past Medical History  Diagnosis Date  . Rheumatoid arthritis(714.0)   . Hypertension   . Degenerative disc disease     Past Surgical History  Procedure Laterality Date  . Hernia repair    . Knee surgery    . Cholecystectomy      Family history: Positive for stroke and elevated cholesterol.  Social History: Patient continues to smoke and has done so for about 50 years. She does not drink alcohol and has no history of illicit drug use area  Allergies: No Known Allergies  Medications: Patient's preadmission medications were reviewed by me.  ROS: History obtained from the patient and patient's daughter.  General ROS: negative for - chills, fatigue, fever, night sweats, weight gain or weight loss Psychological ROS: negative for - behavioral disorder, hallucinations, memory difficulties, mood swings or suicidal ideation Ophthalmic ROS: negative for - blurry vision, double vision, eye pain or loss of vision ENT ROS: negative for - epistaxis, nasal discharge, oral lesions, sore throat, tinnitus or  vertigo Allergy and Immunology ROS: negative for - hives or itchy/watery eyes Hematological and Lymphatic ROS: negative for - bleeding problems, bruising or swollen lymph nodes Endocrine ROS: negative for - galactorrhea, hair pattern changes, polydipsia/polyuria or temperature intolerance Respiratory ROS: negative for - cough, hemoptysis, shortness of breath or wheezing Cardiovascular ROS: negative for - chest pain, dyspnea on exertion, edema or irregular heartbeat Gastrointestinal ROS: negative for - abdominal pain, diarrhea, hematemesis, nausea/vomiting or stool incontinence Genito-Urinary ROS: negative for - dysuria, hematuria, incontinence or urinary frequency/urgency Musculoskeletal ROS: negative for - joint swelling or muscular weakness Neurological ROS: as noted in HPI Dermatological ROS: negative for rash and skin lesion changes  Physical Examination: Blood pressure 170/79, pulse 81, temperature 98.1 F (36.7 C), temperature source Oral, resp. rate 16, SpO2 95 %.  HEENT-  Normocephalic, no lesions, without obvious abnormality.  Normal external eye and conjunctiva.  Normal TM's bilaterally.  Normal auditory canals and external ears. Normal external nose, mucus membranes and septum.  Normal pharynx. Neck supple with no masses, nodes, nodules or enlargement. Cardiovascular - regular rate and rhythm, S1, S2 normal, no murmur, click, rub or gallop Lungs - chest clear, no wheezing, rales, normal symmetric air entry Abdomen - soft, non-tender; bowel sounds normal; no masses,  no organomegaly Extremities - no joint deformities, effusion, or inflammation, no edema and no skin discoloration  Neurologic Examination: Mental Status: Alert, oriented to correct age but disoriented to current month.  Speech fluent without evidence of aphasia. Able to follow commands without difficulty. Cranial Nerves: II-dense left homonymous hemianopsia. III/IV/VI-Pupils were equal and reacted. Extraocular  movements were full and conjugate.  V/VII-no facial numbness and no facial weakness. VIII-normal. X-normal speech and symmetrical palatal movement. XI: trapezius strength/neck flexion strength normal bilaterally XII-midline tongue extension with normal strength. Motor: 5/5 bilaterally with normal tone and bulk Sensory: Normal throughout. Deep Tendon Reflexes: 2+ and symmetric. Plantars: Flexor bilaterally Cerebellar: Normal finger-to-nose testing. Carotid auscultation: Normal  Results for orders placed or performed during the hospital encounter of 10/23/14 (from the past 48 hour(s))  Protime-INR     Status: None   Collection Time: 10/23/14  1:39 PM  Result Value Ref Range   Prothrombin Time 13.5 11.6 - 15.2 seconds   INR 1.01 0.00 - 1.49  APTT     Status: None   Collection Time: 10/23/14  1:39 PM  Result Value Ref Range   aPTT 33 24 - 37 seconds  CBC     Status: Abnormal   Collection Time: 10/23/14  1:39 PM  Result Value Ref Range   WBC 7.1 4.0 - 10.5 K/uL   RBC 4.90 3.87 - 5.11 MIL/uL   Hemoglobin 16.2 (H) 12.0 - 15.0 g/dL   HCT 46.8 (H) 36.0 - 46.0 %   MCV 95.5 78.0 - 100.0 fL   MCH 33.1 26.0 - 34.0 pg   MCHC 34.6 30.0 - 36.0 g/dL   RDW 14.7 11.5 - 15.5 %   Platelets 234 150 - 400 K/uL  Differential     Status: None   Collection Time: 10/23/14  1:39 PM  Result Value Ref Range   Neutrophils Relative % 57 43 - 77 %   Neutro Abs 4.0 1.7 - 7.7 K/uL   Lymphocytes Relative 31 12 - 46 %   Lymphs Abs 2.2 0.7 - 4.0 K/uL   Monocytes Relative 10 3 - 12 %   Monocytes Absolute 0.7 0.1 - 1.0 K/uL   Eosinophils Relative 2 0 - 5 %   Eosinophils Absolute 0.2 0.0 - 0.7 K/uL   Basophils Relative 0 0 - 1 %   Basophils Absolute 0.0 0.0 - 0.1 K/uL  Comprehensive metabolic panel     Status: Abnormal   Collection Time: 10/23/14  1:39 PM  Result Value Ref Range   Sodium 135 135 - 145 mmol/L   Potassium 4.3 3.5 - 5.1 mmol/L   Chloride 97 96 - 112 mmol/L   CO2 28 19 - 32 mmol/L    Glucose, Bld 88 70 - 99 mg/dL   BUN 11 6 - 23 mg/dL   Creatinine, Ser 0.75 0.50 - 1.10 mg/dL   Calcium 9.5 8.4 - 10.5 mg/dL   Total Protein 7.1 6.0 - 8.3 g/dL   Albumin 3.9 3.5 - 5.2 g/dL   AST 30 0 - 37 U/L   ALT 21 0 - 35 U/L   Alkaline Phosphatase 61 39 - 117 U/L   Total Bilirubin 1.0 0.3 - 1.2 mg/dL   GFR calc non Af Amer 74 (L) >90 mL/min   GFR calc Af Amer 86 (L) >90 mL/min    Comment: (NOTE) The eGFR has been calculated using the CKD EPI equation. This calculation has not been validated in all clinical situations. eGFR's persistently <90 mL/min signify possible Chronic Kidney Disease.    Anion gap 10 5 - 15  I-stat troponin, ED (not at [facility])     Status: Abnormal   Collection Time: 10/23/14  1:48 PM  Result Value Ref Range   Troponin i, poc 0.13 (HH) 0.00 - 0.08 ng/mL   Comment NOTIFIED PHYSICIAN    Comment 3  Comment: Due to the release kinetics of cTnI, a negative result within the first hours of the onset of symptoms does not rule out myocardial infarction with certainty. If myocardial infarction is still suspected, repeat the test at appropriate intervals.   Urinalysis, Routine w reflex microscopic     Status: Abnormal   Collection Time: 10/23/14  2:51 PM  Result Value Ref Range   Color, Urine YELLOW YELLOW   APPearance CLEAR CLEAR   Specific Gravity, Urine 1.007 1.005 - 1.030   pH 7.0 5.0 - 8.0   Glucose, UA NEGATIVE NEGATIVE mg/dL   Hgb urine dipstick NEGATIVE NEGATIVE   Bilirubin Urine NEGATIVE NEGATIVE   Ketones, ur 15 (A) NEGATIVE mg/dL   Protein, ur NEGATIVE NEGATIVE mg/dL   Urobilinogen, UA 0.2 0.0 - 1.0 mg/dL   Nitrite NEGATIVE NEGATIVE   Leukocytes, UA NEGATIVE NEGATIVE    Comment: MICROSCOPIC NOT DONE ON URINES WITH NEGATIVE PROTEIN, BLOOD, LEUKOCYTES, NITRITE, OR GLUCOSE <1000 mg/dL.   Ct Head (brain) Wo Contrast  10/23/2014   CLINICAL DATA:  81 year old female with acute altered mental status, confusion. Initial encounter.  EXAM: CT  HEAD WITHOUT CONTRAST  TECHNIQUE: Contiguous axial images were obtained from the base of the skull through the vertex without intravenous contrast.  COMPARISON:  None.  FINDINGS: Visualized paranasal sinuses and mastoids are clear. Visualized orbit soft tissues are within normal limits. Visualized scalp soft tissues are within normal limits. No acute osseous abnormality identified.  Confluent hypodensity involving gray and white-matter in the hip posterior right hemisphere, posterior right temporal lobe. No associated hemorrhage or mass effect. Calcified atherosclerosis at the skull base. No suspicious intracranial vascular hyperdensity. Possible small dural calcification on series 2, image 14 in this region, does not resemble a hyperdense vein.  Superimposed bilateral Patchy and confluent white matter hypodensity. Small linear hypodensity in the inferior cerebellum on the left (series 2, image 6). No midline shift, mass effect, or evidence of intracranial mass lesion. No acute intracranial hemorrhage identified.  IMPRESSION: 1. Acute to subacute posterior right MCA territory infarct involving the posterior right temporal lobe. No mass effect or hemorrhage. 2. Age indeterminate bilateral small vessel disease including in the left cerebellum.   Electronically Signed   By: Genevie Ann M.D.   On: 10/23/2014 15:22    Assessment: 81 y.o. female with multiple risk factors for stroke presenting with an acute right posterior MCA territory ischemic cerebral infarction.   Stroke Risk Factors - family history, hypertension and smoking  Plan: 1. HgbA1c, fasting lipid panel 2. MRI, MRA  of the brain without contrast 3. PT consult, OT consult, Speech consult 4. Echocardiogram 5. Carotid dopplers 6. Prophylactic therapy-Antiplatelet med: Aspirin  7. Risk factor modification 8. Telemetry monitoring  C.R. Nicole Kindred, MD [facility] [phone]  10/23/2014, 4:41 PM

## 2014-10-23 NOTE — ED Notes (Signed)
Report attempted, this nurse notified that pt was moved to a dirty room and it will take a while, bed placement notified.

## 2014-10-23 NOTE — ED Notes (Signed)
Nurse Lexine Baton was notified of the patients 0.13 I-stat troponin.

## 2014-10-23 NOTE — ED Notes (Signed)
Patient states that she "had a twinge in her right shoulder." Family states that she has been more confused since yesterday. She went to see her doctor yesterday and the doctor said for her to come to the ER today.

## 2014-10-23 NOTE — ED Notes (Signed)
Patient transported to MRI 

## 2014-10-23 NOTE — H&P (Signed)
PATIENT DETAILS Name: Debbie Kramer Age: 81 y.o. Sex: female Date of Birth: May 15, 1926 Admit Date: 10/23/2014 PIR:JJOACZY,SAYTKZS W, MD   CHIEF COMPLAINT:  Confusion  HPI: Debbie Kramer is a 81 y.o. female with a Past Medical History of hypertension, rheumatoid arthritis, ongoing tobacco abuse who presents today with the above noted complaint. Per patient, since yesterday she has had disorientation and confusion.she normally plays crossword puzzle, and yesterday she was having difficulty putting words in place in her mind-although she did not have any speech difficulty. She also noted by family to have difficulty driving and seemed more forgetful than usual. She subsequently was brought to the ED today, where a CT scan of the head showed findings consistent with a right posterior MCA ischemic infarct. The hospitalist service was asked to admit this patient for further evaluation and treatment No history of fever, chest pain, shortness of breath, nausea, vomiting, diarrhea or dysuria. There is no history of abdominal pain   ALLERGIES:  No Known Allergies  PAST MEDICAL HISTORY: Past Medical History  Diagnosis Date  . Rheumatoid arthritis(714.0)   . Hypertension   . Degenerative disc disease     PAST SURGICAL HISTORY: Past Surgical History  Procedure Laterality Date  . Hernia repair    . Knee surgery    . Cholecystectomy      MEDICATIONS AT HOME: Prior to Admission medications   Medication Sig Start Date End Date Taking? Authorizing Provider  alendronate (FOSAMAX) 70 MG tablet Take 70 mg by mouth once a week. Take with a full glass of water on an empty stomach.   Yes Historical Provider, MD  benazepril (LOTENSIN) 20 MG tablet Take 20 mg by mouth daily.     Yes Historical Provider, MD  Calcium Citrate-Vitamin D (CITRACAL PETITES/VITAMIN D PO) Take by mouth.     Yes Historical Provider, MD  folic acid (FOLVITE) 1 MG tablet Take 1 mg by mouth daily.   Yes Historical  Provider, MD  InFLIXimab (REMICADE IV) Inject 200 mg into the vein every 8 (eight) weeks.    Yes Historical Provider, MD  meloxicam (MOBIC) 15 MG tablet Take 15 mg by mouth daily.   Yes Historical Provider, MD  methotrexate 2.5 MG tablet Take 2.5 mg by mouth once a week.   Yes Historical Provider, MD  Multiple Vitamin (MULTIVITAMIN PO) Take 1 capsule by mouth daily.    Yes Historical Provider, MD  ALENDRONATE SODIUM PO Take by mouth.      Historical Provider, MD    FAMILY HISTORY: Father had emphysema and CVA  SOCIAL HISTORY:  reports that she has been smoking Cigarettes.  She has a 25 pack-year smoking history. She does not have any smokeless tobacco history on file. She reports that she does not drink alcohol. Her drug history is not on file.  REVIEW OF SYSTEMS:  Constitutional:   No  weight loss, night sweats,  Fevers, chills, fatigue.  HEENT:    No headaches, Difficulty swallowing,Tooth/dental problems,Sore throat  Cardio-vascular: No chest pain,  Orthopnea, PND, swelling in lower extremities, anasarca  GI:  No heartburn, indigestion, abdominal pain, nausea, vomiting, diarrhe  Resp: No shortness of breath with exertion or at rest.  No excess mucus, no productive cough, No non-productive cough,  No coughing up of blood.No change in color of mucus.No wheezing.No chest wall deformity  Skin:  no rash or lesions.  GU:  no dysuria, change in color of urine, no urgency or frequency.  No flank pain.  Musculoskeletal: No joint pain or swelling.  No decreased range of motion.  No back pain.  Psych: No change in mood or affect. No depression or anxiety.  No memory loss.   PHYSICAL EXAM: Blood pressure 170/79, pulse 81, temperature 99.6 F (37.6 C), temperature source Oral, resp. rate 16, SpO2 95 %.  General appearance :Awake, alert, not in any distress. Speech Clear. Not toxic Looking HEENT: Atraumatic and Normocephalic, pupils equally reactive to light and  accomodation Neck: supple, no JVD. No cervical lymphadenopathy.  Chest:Good air entry bilaterally, no added sounds  CVS: S1 S2 regular, no murmurs.  Abdomen: Bowel sounds present, Non tender and not distended with no gaurding, rigidity or rebound. Extremities: B/L Lower Ext shows no edema, both legs are warm to touch Neurology: Awake alert, and oriented X 3, CN II-XII intact, Non focal Skin:No Rash Wounds:N/A  LABS ON ADMISSION:   Recent Labs  10/23/14 1339  NA 135  K 4.3  CL 97  CO2 28  GLUCOSE 88  BUN 11  CREATININE 0.75  CALCIUM 9.5    Recent Labs  10/23/14 1339  AST 30  ALT 21  ALKPHOS 61  BILITOT 1.0  PROT 7.1  ALBUMIN 3.9   No results for input(s): LIPASE, AMYLASE in the last 72 hours.  Recent Labs  10/23/14 1339  WBC 7.1  NEUTROABS 4.0  HGB 16.2*  HCT 46.8*  MCV 95.5  PLT 234   No results for input(s): CKTOTAL, CKMB, CKMBINDEX, TROPONINI in the last 72 hours. No results for input(s): DDIMER in the last 72 hours. Invalid input(s): POCBNP   RADIOLOGIC STUDIES ON ADMISSION: Ct Head (brain) Wo Contrast  10/23/2014   CLINICAL DATA:  81 year old female with acute altered mental status, confusion. Initial encounter.  EXAM: CT HEAD WITHOUT CONTRAST  TECHNIQUE: Contiguous axial images were obtained from the base of the skull through the vertex without intravenous contrast.  COMPARISON:  None.  FINDINGS: Visualized paranasal sinuses and mastoids are clear. Visualized orbit soft tissues are within normal limits. Visualized scalp soft tissues are within normal limits. No acute osseous abnormality identified.  Confluent hypodensity involving gray and white-matter in the hip posterior right hemisphere, posterior right temporal lobe. No associated hemorrhage or mass effect. Calcified atherosclerosis at the skull base. No suspicious intracranial vascular hyperdensity. Possible small dural calcification on series 2, image 14 in this region, does not resemble a hyperdense  vein.  Superimposed bilateral Patchy and confluent white matter hypodensity. Small linear hypodensity in the inferior cerebellum on the left (series 2, image 6). No midline shift, mass effect, or evidence of intracranial mass lesion. No acute intracranial hemorrhage identified.  IMPRESSION: 1. Acute to subacute posterior right MCA territory infarct involving the posterior right temporal lobe. No mass effect or hemorrhage. 2. Age indeterminate bilateral small vessel disease including in the left cerebellum.   Electronically Signed   By: Genevie Ann M.D.   On: 10/23/2014 15:22     EKG: Independently reviewed:normal sinus rhythm   ASSESSMENT AND PLAN: Present on Admission:  . Acute CVA (cerebrovascular accident): Admit to telemetry, continue aspirin. Obtain MRI brain, echocardiogram and carotid Doppler. Follow further recommendations from neurology   . Essential hypertension: Allow permissive hypertension, resume antihypertensives from tomorrow.   . Rheumatoid arthritis: Hold methotrexate and internal while inpatient. Resume of discharge   . Minimal troponin elevation: Suspect false positive elevation, no chest pain or shortness of breath. EKG no significant changes. Will cycle troponins, continue aspirin and follow.  Echocardiogram has been ordered as part of stroke workup. If significant elevation in cardiac enzymes, will need to consult cardiology   . Tobacco abuse: Counseled extensively   Further plan will depend as patient's clinical course evolves and further radiologic and laboratory data become available. Patient will be monitored closely.  Above noted plan was discussed with patient/daughters, they were in agreement.   DVT Prophylaxis: Prophylactic Lovenox   Code Status: Full Code  Disposition Plan: suspect could be discharged home in 1 day   Total time spent for admission equals 45 minutes.  [facility] Pager [phone]  If 7PM-7AM, please contact  night-coverage [email or web address] [email or web address] 10/23/2014, 5:19 PM

## 2014-10-24 LAB — LIPID PANEL
CHOLESTEROL: 153 mg/dL (ref 0–200)
HDL: 68 mg/dL (ref >39)
LDL CALC: 77 mg/dL (ref 0–99)
TRIGLYCERIDES: 41 mg/dL (ref <150)
Total CHOL/HDL Ratio: 2.3 RATIO
VLDL: 8 mg/dL (ref 0–40)

## 2014-10-24 LAB — BASIC METABOLIC PANEL
Anion gap: 9 (ref 5–15)
BUN: 12 mg/dL (ref 6–23)
CALCIUM: 8.9 mg/dL (ref 8.4–10.5)
CO2: 25 mmol/L (ref 19–32)
CREATININE: 0.78 mg/dL (ref 0.50–1.10)
Chloride: 102 mmol/L (ref 96–112)
GFR calc Af Amer: 85 mL/min — ABNORMAL LOW (ref >90)
GFR calc non Af Amer: 73 mL/min — ABNORMAL LOW (ref >90)
Glucose, Bld: 73 mg/dL (ref 70–99)
Potassium: 4.2 mmol/L (ref 3.5–5.1)
SODIUM: 136 mmol/L (ref 135–145)

## 2014-10-24 LAB — GLUCOSE, CAPILLARY
GLUCOSE-CAPILLARY: 76 mg/dL (ref 70–99)
GLUCOSE-CAPILLARY: 91 mg/dL (ref 70–99)
Glucose-Capillary: 123 mg/dL — ABNORMAL HIGH (ref 70–99)
Glucose-Capillary: 91 mg/dL (ref 70–99)

## 2014-10-24 LAB — CBC
HEMATOCRIT: 46.9 % — AB (ref 36.0–46.0)
Hemoglobin: 15.8 g/dL — ABNORMAL HIGH (ref 12.0–15.0)
MCH: 32 pg (ref 26.0–34.0)
MCHC: 33.7 g/dL (ref 30.0–36.0)
MCV: 95.1 fL (ref 78.0–100.0)
Platelets: 226 10*3/uL (ref 150–400)
RBC: 4.93 MIL/uL (ref 3.87–5.11)
RDW: 14.6 % (ref 11.5–15.5)
WBC: 6.7 10*3/uL (ref 4.0–10.5)

## 2014-10-24 LAB — TROPONIN I
Troponin I: 0.14 ng/mL — ABNORMAL HIGH (ref <0.031)
Troponin I: 0.16 ng/mL — ABNORMAL HIGH (ref <0.031)

## 2014-10-24 LAB — MAGNESIUM: Magnesium: 2 mg/dL (ref 1.5–2.5)

## 2014-10-24 MED ORDER — SIMVASTATIN 5 MG PO TABS
10.0000 mg | ORAL_TABLET | Freq: Every day | ORAL | Status: DC
  Administered 2014-10-24 – 2014-10-25 (×2): 10 mg via ORAL
  Filled 2014-10-24 (×2): qty 2

## 2014-10-24 MED ORDER — NICOTINE 21 MG/24HR TD PT24
21.0000 mg | MEDICATED_PATCH | Freq: Every day | TRANSDERMAL | Status: DC
  Administered 2014-10-24 – 2014-10-25 (×2): 21 mg via TRANSDERMAL
  Filled 2014-10-24 (×2): qty 1

## 2014-10-24 NOTE — Progress Notes (Signed)
Pt. Arrived from ED with RN with VSS and reporting no pain. Will continue to monitor. Ruben Gottron, [place]  10/23/2014 2145
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
## 2014-10-24 NOTE — Evaluation (Signed)
Clinical/Bedside Swallow Evaluation Patient Details  Name: Debbie Kramer MRN: [id number] Date of Birth: [birth year]  Today's Date: 10/24/2014 Time: SLP Start Time (ACUTE ONLY): 6144 SLP Stop Time (ACUTE ONLY): 0950 SLP Time Calculation (min) (ACUTE ONLY): 23 min  Past Medical History:  Past Medical History  Diagnosis Date  . Rheumatoid arthritis(714.0)   . Hypertension   . Degenerative disc disease    Past Surgical History:  Past Surgical History  Procedure Laterality Date  . Hernia repair    . Knee surgery    . Cholecystectomy     HPI:  81 y.o. female with a Past Medical History of hypertension, rheumatoid arthritis, ongoing tobacco abuse who presented yesterday disorientation, confusion, problems putting words in place in her mind per MD note.  A CT scan of the head showed findings consistent with a right posterior MCA ischemic infarct.  Swallow evaluation ordered as pt presented with lung sound changes with stroke swallow screen.     Assessment / Plan / Recommendation Clinical Impression  Pt presents with functional oropharyngeal swallow without overt indications of aspiration/laryngeal penetration.  She did present with delayed coughing x3 during snack after belching and reports this to be normal - ? backflow into pharynx with poor awareness.  Baseline congested cough noted and pt/daughter state this is baseline as well.  Suspect component of low grade chronic dysphagia given pt/daughter report.    Educated pt/daughter to inability to rule out silent aspiration at bedside and options for MBS or po diet.  Both agree to start po diet with general aspiration precautions and monitor for indication for instrumental evaluation.    Reviewed aspiration precautions/indications of airway compromise with intake that and need to monitor vitals to determine if necessitate further testing.  Recommend pt consume regular/thin diet     Aspiration Risk  Mild    Diet Recommendation Regular;Thin  liquid   Liquid Administration via: Cup;Straw Medication Administration: Whole meds with liquid Supervision: Patient able to self feed Compensations: Slow rate;Small sips/bites Postural Changes and/or Swallow Maneuvers: Seated upright 90 degrees;Upright 30-60 min after meal    Other  Recommendations Oral Care Recommendations: Oral care BID   Follow Up Recommendations  None    Frequency and Duration min 1 x/week  1 week   Pertinent Vitals/Pain Low grade temperature, decreased     Swallow Study Prior Functional Status  Type of Home: House Available Help at Discharge: Available PRN/intermittently;Family    General Date of Onset: 10/24/14 HPI: 81 y.o. female with a Past Medical History of hypertension, rheumatoid arthritis, ongoing tobacco abuse who presented yesterday disorientation, confusion, problems putting words in place in her mind per MD note.  A CT scan of the head showed findings consistent with a right posterior MCA ischemic infarct.  Swallow evaluation ordered as pt presented with lung sound changes with stroke swallow screen.   Type of Study: Bedside swallow evaluation Diet Prior to this Study: NPO Temperature Spikes Noted: No Respiratory Status: Room air History of Recent Intubation: No Behavior/Cognition: Alert;Cooperative;Pleasant mood Oral Cavity - Dentition: Adequate natural dentition Self-Feeding Abilities: Able to feed self Patient Positioning: Upright in bed Baseline Vocal Quality: Clear Volitional Cough: Strong Volitional Swallow: Able to elicit    Oral/Motor/Sensory Function Overall Oral Motor/Sensory Function:  (? mild right upper labial asymmetry, family *dtr Mortimer Fries reports is baseline)   [employer] chips: Not tested   Thin Liquid Thin Liquid: Within functional limits Presentation: Cup;Straw;Self Fed    Nectar Thick Nectar Thick Liquid: Not tested  Honey Thick Honey Thick Liquid: Not tested   Puree Puree: Within functional limits Presentation:  Self Fed;Spoon   Solid   GO    Solid: Within functional limits Presentation: [place], [place] SLP [phone]

## 2014-10-24 NOTE — Evaluation (Signed)
Physical Therapy Evaluation Patient Details Name: Debbie Kramer MRN: [id number] DOB: Jan 26, 1926 Today's Date: 10/24/2014   History of Present Illness  TECORA Kramer is a 81 y.o. female with a Past Medical History of hypertension, rheumatoid arthritis, ongoing tobacco abuse who presents today withconfusion. Per patient, since yesterday she has had disorientation and confusion. She also noted by family to have difficulty driving and seemed more forgetful than usual. She subsequently was brought to the ED today, where a CT scan of the head showed findings consistent with a right posterior MCA ischemic infarct.   Clinical Impression  Pt admitted with above diagnosis. Pt currently with functional limitations due to the deficits listed below (see PT Problem List). Pt with left visual field cut that is affecting safety with mobility. Will follow acutely to work with her on this, do not anticipate her needing PT after d/c. Pt will benefit from skilled PT to increase their independence and safety with mobility to allow discharge to the venue listed below.       Follow Up Recommendations No PT follow up    Equipment Recommendations  None recommended by PT    Recommendations for Other Services       Precautions / Restrictions Precautions Precautions: Fall Precaution Comments: no falls at home recently Restrictions Weight Bearing Restrictions: No      Mobility  Bed Mobility Overal bed mobility: Modified Independent                Transfers Overall transfer level: Modified independent Equipment used: Straight cane                Ambulation/Gait Ambulation/Gait assistance: Min guard Ambulation Distance (Feet): 100 Feet Assistive device: Straight cane Gait Pattern/deviations: Step-through pattern;Wide base of support Gait velocity: WFL Gait velocity interpretation: at or above normal speed for age/gender General Gait Details: pt ambulates with bilateral feet toed out,  reports she has likely ambulated this way since bilateral TKA. Repeated vc's for pt to look to left to scan left environment  Stairs            Wheelchair Mobility    Modified Rankin (Stroke Patients Only) Modified Rankin (Stroke Patients Only) Pre-Morbid Rankin Score: No symptoms Modified Rankin: Moderate disability     Balance Overall balance assessment: Needs assistance Sitting-balance support: Feet supported;No upper extremity supported Sitting balance-Leahy Scale: Good     Standing balance support: No upper extremity supported;During functional activity Standing balance-Leahy Scale: Fair Standing balance comment: decreased stability with challenge to left                             Pertinent Vitals/Pain Pain Assessment: No/denies pain    Home Living Family/patient expects to be discharged to:: Private residence Living Arrangements: Children Available Help at Discharge: Available PRN/intermittently;Family Type of Home: House Home Access: Stairs to enter Entrance Stairs-Rails: Right Entrance Stairs-Number of Steps: 2 Home Layout: One level Home Equipment: Cane - single point Additional Comments: pt still drives but pulled out in front of a car coming from the left 2 days ago. knows that she cannot drive right now. Daughter lives with her but is in and out and sometimes gone on weekends.    Prior Function Level of Independence: Independent with assistive device(s)         Comments: uses cane, walks property regularly     Hand Dominance        Extremity/Trunk Assessment   Upper  Extremity Assessment: Defer to OT evaluation           Lower Extremity Assessment: Overall WFL for tasks assessed      Cervical / Trunk Assessment: Kyphotic  Communication   Communication: No difficulties  Cognition Arousal/Alertness: Awake/alert Behavior During Therapy: WFL for tasks assessed/performed Overall Cognitive Status: Impaired/Different from  baseline Area of Impairment: Safety/judgement         Safety/Judgement: Decreased awareness of deficits;Decreased awareness of safety     General Comments: pt with decreased awareness of left visual field cut and needing frequent reminders to attend to left environment    General Comments General comments (skin integrity, edema, etc.): O2 sats 95% on RA. Pt's daughter reports that pt coughs terribly at night (smokers cough) but that she did not last night when she was on O2 here at the hospital. Daughter wondering if this can be investigated further while pt hospitalized. RN made aware.    Exercises        Assessment/Plan    PT Assessment Patient needs continued PT services  PT Diagnosis Abnormality of gait;Difficulty walking;Altered mental status   PT Problem List Decreased balance;Decreased mobility;Decreased cognition;Decreased safety awareness;Decreased knowledge of precautions  PT Treatment Interventions Gait training;Stair training;Balance training;Therapeutic exercise;Therapeutic activities;Functional mobility training   PT Goals (Current goals can be found in the Care Plan section) Acute Rehab PT Goals Patient Stated Goal: return home PT Goal Formulation: With patient Time For Goal Achievement: 10/31/14 Potential to Achieve Goals: Good Additional Goals Additional Goal #1: pt will scan left visual field while ambulating, >5 times in 50'.    Frequency Min 4X/week   Barriers to discharge        Co-evaluation               End of Session Equipment Utilized During Treatment: Gait belt Activity Tolerance: Patient tolerated treatment well Patient left: in chair;with chair alarm set;with call bell/phone within reach;with family/visitor present Nurse Communication: Mobility status         Time: 0902-0923 PT Time Calculation (min) (ACUTE ONLY): 21 min   Charges:   PT Evaluation $Initial PT Evaluation Tier I: 1 Procedure     PT G Codes:       Leighton Roach, PT  Acute Rehab Services  [phone]  Leighton Roach 10/24/2014, 9:52 AM

## 2014-10-24 NOTE — Progress Notes (Signed)
STROKE TEAM PROGRESS NOTE   HISTORY Debbie Kramer is an 81 y.o. female with a history of hypertension, rheumatoid arthritis and degenerative disc disease brought to the emergency room because of confusion which was first noticed yesterday afternoon by family members. Exact time of onset is unclear (LKW 10/21/2014). Patient has had difficulty with crossword puzzles as well as with performing simple tasks. She's also seemed more forgetful than usual. She had some difficulty with driving yesterday afternoon and last night. CT scan of the head was obtained which showed findings consistent with an acute to subacute right posterior MCA artery ischemic stroke. Patient has no previous history of stroke nor TIA. NIH stroke score was 3. Patient was not administered TPA secondary to Unclear time of onset of deficits; unequivocal recent stroke on CT. She was admitted for further evaluation and treatment.   SUBJECTIVE (INTERVAL HISTORY) Her family is at the bedside.     OBJECTIVE Temp:  [97.8 F (36.6 C)-99.6 F (37.6 C)] 97.8 F (36.6 C) (03/23 1441) Pulse Rate:  [73-94] 77 (03/23 1441) Cardiac Rhythm:  [-] Normal sinus rhythm (03/23 1000) Resp:  [14-20] 18 (03/23 1441) BP: (115-170)/(44-107) 121/44 mmHg (03/23 1441) SpO2:  [93 %-98 %] 98 % (03/23 1441) Weight:  [63.6 kg (140 lb 3.4 oz)] 63.6 kg (140 lb 3.4 oz) (03/22 2147)   Recent Labs Lab 10/23/14 2238 10/24/14 0626 10/24/14 1117  GLUCAP 93 76 123*    Recent Labs Lab 10/23/14 1339 10/23/14 1941 10/24/14 0920  NA 135  --  136  K 4.3  --  4.2  CL 97  --  102  CO2 28  --  25  GLUCOSE 88  --  73  BUN 11  --  12  CREATININE 0.75 0.65 0.78  CALCIUM 9.5  --  8.9  MG  --   --  2.0    Recent Labs Lab 10/23/14 1339  AST 30  ALT 21  ALKPHOS 61  BILITOT 1.0  PROT 7.1  ALBUMIN 3.9    Recent Labs Lab 10/23/14 1339 10/23/14 1941 10/24/14 0920  WBC 7.1 6.7 6.7  NEUTROABS 4.0  --   --   HGB 16.2* 15.7* 15.8*  HCT 46.8* 45.4  46.9*  MCV 95.5 93.0 95.1  PLT 234 212 226    Recent Labs Lab 10/23/14 1941 10/24/14 0123 10/24/14 0920  TROPONINI 0.18* 0.14* 0.16*    Recent Labs  10/23/14 1339  LABPROT 13.5  INR 1.01    Recent Labs  10/23/14 1451  COLORURINE YELLOW  LABSPEC 1.007  PHURINE 7.0  GLUCOSEU NEGATIVE  HGBUR NEGATIVE  BILIRUBINUR NEGATIVE  KETONESUR 15*  PROTEINUR NEGATIVE  UROBILINOGEN 0.2  NITRITE NEGATIVE  LEUKOCYTESUR NEGATIVE       Component Value Date/Time   CHOL 153 10/24/2014 0123   TRIG 41 10/24/2014 0123   HDL 68 10/24/2014 0123   CHOLHDL 2.3 10/24/2014 0123   VLDL 8 10/24/2014 0123   LDLCALC 77 10/24/2014 0123   No results found for: HGBA1C No results found for: LABOPIA, COCAINSCRNUR, LABBENZ, AMPHETMU, THCU, LABBARB  No results for input(s): ETH in the last 168 hours.  Ct Head (brain) Wo Contrast  10/23/2014   CLINICAL DATA:  81 year old female with acute altered mental status, confusion. Initial encounter.  EXAM: CT HEAD WITHOUT CONTRAST  TECHNIQUE: Contiguous axial images were obtained from the base of the skull through the vertex without intravenous contrast.  COMPARISON:  None.  FINDINGS: Visualized paranasal sinuses and mastoids are clear. Visualized  orbit soft tissues are within normal limits. Visualized scalp soft tissues are within normal limits. No acute osseous abnormality identified.  Confluent hypodensity involving gray and white-matter in the hip posterior right hemisphere, posterior right temporal lobe. No associated hemorrhage or mass effect. Calcified atherosclerosis at the skull base. No suspicious intracranial vascular hyperdensity. Possible small dural calcification on series 2, image 14 in this region, does not resemble a hyperdense vein.  Superimposed bilateral Patchy and confluent white matter hypodensity. Small linear hypodensity in the inferior cerebellum on the left (series 2, image 6). No midline shift, mass effect, or evidence of intracranial mass  lesion. No acute intracranial hemorrhage identified.  IMPRESSION: 1. Acute to subacute posterior right MCA territory infarct involving the posterior right temporal lobe. No mass effect or hemorrhage. 2. Age indeterminate bilateral small vessel disease including in the left cerebellum.   Electronically Signed   By: Genevie Ann M.D.   On: 10/23/2014 15:22   Mr Brain Wo Contrast  10/23/2014   CLINICAL DATA:  Stroke.  EXAM: MRI HEAD WITHOUT CONTRAST  MRA HEAD WITHOUT CONTRAST  TECHNIQUE: Multiplanar, multiecho pulse sequences of the brain and surrounding structures were obtained without intravenous contrast. Angiographic images of the head were obtained using MRA technique without contrast.  COMPARISON:  Head CT from earlier the same day  FINDINGS: MRI HEAD FINDINGS  Calvarium and upper cervical spine: No marrow signal abnormality.  Orbits: No significant findings.  Sinuses: Clear. Mastoid and middle ears are clear.  Brain: There is a moderate area of restricted diffusion in the posterior right temporal lobe and parietal lobe consistent with acute infarct. This is in the posterior right MCA territory and is near but less than 1/3 of the vascular territory. There are multiple additional foci of diffusion hyperintensity throughout the bilateral cerebral cortex, bilateral centrum semiovale, left corona radiata, and mid right cerebellum. One of the cortical infarcts is notably on the precentral gyrus. While the small areas are difficult to correlate on ADC, especially given motion, they are convincing for acute infarct. No hemorrhagic conversion is suspected. Mildly prominent serpiginous signal on SWI imaging is likely intravascular enhancement/ engorgement related to the neighboring infarct. The slowly flowing or thrombosed right M4 vessel near the infarct is visible on FLAIR imaging. There is a background of chronic small-vessel disease with ischemic gliosis present throughout the bilateral cerebral hemispheres. Small  remote bilateral cerebellar infarcts, the largest 1 cm in the left tonsil.  MRA HEAD FINDINGS  Mild right vertebral artery dominance. There is narrowing of the left vertebral artery at the dural penetration which is mild. The left PICA originates near the dural penetration. The PICAs are symmetric and normal in appearance. Symmetric superior cerebellar and anterior inferior cerebellar arteries. Bilateral posterior communicating arteries, larger on the left. The PCAs are symmetric and unremarkable.  Balanced carotids. There are likely infundibula of the posterior communicating artery origins. No definite aneurysm is identified. No major vessel/ treatable stenosis. No notable distal vascular irregularity. When accounting for fading signal at the M4 vessel level, a definite branch occlusion is not identified to correlate with the acute infarct and FLAIR abnormality.  IMPRESSION: 1. Acute infarcts in multiple vascular territories suggesting a cardioembolic process. A moderate sized infarct is present in the posterior right MCA territory; the remaining acute infarcts are punctate in size. 2. Chronic small vessel disease and remote small vessel infarcts. 3. No treatable intracranial stenosis or significant atherosclerotic change.   Electronically Signed   By: Neva Seat.D.  On: 10/23/2014 21:34   Mr Jodene Nam Head/brain Wo Cm  10/23/2014   CLINICAL DATA:  Stroke.  EXAM: MRI HEAD WITHOUT CONTRAST  MRA HEAD WITHOUT CONTRAST  TECHNIQUE: Multiplanar, multiecho pulse sequences of the brain and surrounding structures were obtained without intravenous contrast. Angiographic images of the head were obtained using MRA technique without contrast.  COMPARISON:  Head CT from earlier the same day  FINDINGS: MRI HEAD FINDINGS  Calvarium and upper cervical spine: No marrow signal abnormality.  Orbits: No significant findings.  Sinuses: Clear. Mastoid and middle ears are clear.  Brain: There is a moderate area of restricted  diffusion in the posterior right temporal lobe and parietal lobe consistent with acute infarct. This is in the posterior right MCA territory and is near but less than 1/3 of the vascular territory. There are multiple additional foci of diffusion hyperintensity throughout the bilateral cerebral cortex, bilateral centrum semiovale, left corona radiata, and mid right cerebellum. One of the cortical infarcts is notably on the precentral gyrus. While the small areas are difficult to correlate on ADC, especially given motion, they are convincing for acute infarct. No hemorrhagic conversion is suspected. Mildly prominent serpiginous signal on SWI imaging is likely intravascular enhancement/ engorgement related to the neighboring infarct. The slowly flowing or thrombosed right M4 vessel near the infarct is visible on FLAIR imaging. There is a background of chronic small-vessel disease with ischemic gliosis present throughout the bilateral cerebral hemispheres. Small remote bilateral cerebellar infarcts, the largest 1 cm in the left tonsil.  MRA HEAD FINDINGS  Mild right vertebral artery dominance. There is narrowing of the left vertebral artery at the dural penetration which is mild. The left PICA originates near the dural penetration. The PICAs are symmetric and normal in appearance. Symmetric superior cerebellar and anterior inferior cerebellar arteries. Bilateral posterior communicating arteries, larger on the left. The PCAs are symmetric and unremarkable.  Balanced carotids. There are likely infundibula of the posterior communicating artery origins. No definite aneurysm is identified. No major vessel/ treatable stenosis. No notable distal vascular irregularity. When accounting for fading signal at the M4 vessel level, a definite branch occlusion is not identified to correlate with the acute infarct and FLAIR abnormality.  IMPRESSION: 1. Acute infarcts in multiple vascular territories suggesting a cardioembolic process.  A moderate sized infarct is present in the posterior right MCA territory; the remaining acute infarcts are punctate in size. 2. Chronic small vessel disease and remote small vessel infarcts. 3. No treatable intracranial stenosis or significant atherosclerotic change.   Electronically Signed   By: Monte Fantasia M.D.   On: 10/23/2014 21:34     PHYSICAL EXAM Pleasant elderly caucasian lady not in distress. . Afebrile. Head is nontraumatic. Neck is supple without bruit.    Cardiac exam no murmur or gallop. Lungs are clear to auscultation. Distal pulses are well felt. Neurological Exam ;  Awake  Alert oriented x 3. Normal speech and language.eye movements full without nystagmus.fundi were not visualized. Vision acuity and fields appear normal. Hearing is normal. Palatal movements are normal. Face symmetric. Tongue midline. Normal strength, tone, reflexes and coordination. Normal sensation but subjective paresthesias left hand medial aspect only.. Gait deferred. ASSESSMENT/PLAN Ms. ALEINA BURGIO is a 81 y.o. female with history of hypertension, rheumatoid arthritis and degenerative disc disease presenting with new onset confusion. She did not receive IV t-PA due to unclear last known well.   Stroke:  bilateral anterior and posterior circulation punctate infarcts, largest in the R PCA, felt to  be embolic secondary to unknown source, suspicious for paroxysmal atrial fibrillation   Resultant  Poor short-term memory  MRI  Bilateral anterior and posterior punctate infarcts, R PCA infarct. small vessel disease   MRA  No significant stenosis   Carotid Doppler  pending   2D Echo  pending   TEE to look for embolic source. Arranged with Ada for tomorrow.  If positive for PFO (patent foramen ovale), check bilateral lower extremity venous dopplers to rule out DVT as possible source of stroke. (I have made patient NPO after midnight tonight).  If TEE negative, a [place] electrophysiologist will consult and consider placement of an implantable loop recorder to evaluate for atrial fibrillation as etiology of stroke. This has been explained to patient/family by Dr. Leonie Man and they are agreeable.   HgbA1c pending  Lovenox 40 mg sq daily for VTE prophylaxis  Diet regular thin liquids  no antithrombotic prior to admission, now on aspirin 325 mg orally every day  Ongoing aggressive stroke risk factor management  Therapy recommendations:  OP OT, no PT needs  Disposition:  Home with OP therapies  Hypertension  Permissive hypertension (OK if < 220/120) but gradually normalize in 5-7 days  BP 115-190/44-81 past 24h (10/24/2014 @ 2:56 PM)  Hyperlipidemia  Home meds:  No statin  LDL 77, goal < 70  Add low dose statin (zocoro 10)  Continue statin at discharge  Other Stroke Risk Factors  Advanced age  Cigarette smoker, advised to stop smoking  Family hx stroke (father)  Other Active Problems  RA  Minimal troponin elevation  Hospital day # [place] for Pager information 10/24/2014 2:55 PM  I have personally examined this patient, reviewed notes, independently viewed imaging studies, participated in medical decision making and plan of care. I have made any additions or clarifications directly to the above note. Agree with note above. She presented with transient right hand weakness and paresthesias due to right MCA branch infarct likely embolic with unidentified source. She remains at risk for neurological worsening, recurrent stroke, TIA and needs ongoing stroke evaluation and aggressive risk factor modification. Plan to check TEE and loop recorder if carotid ultrasound and transthoracic echo did not reveal the source of the clot. Long discussion at the bedside with the patient's daughter and answered questions. Antony Contras, MD Medical Director [facility] Pager:  [phone] 10/24/2014 3:51 PM    To contact Stroke Continuity provider, please refer to [email or web address] After hours, contact General Neurology

## 2014-10-24 NOTE — Evaluation (Signed)
SLP Cancellation Note  Patient Details Name: Debbie Kramer MRN: [id number] DOB: Apr 09, 1926   Cancelled treatment:       Reason Eval/Treat Not Completed:  (2nd attempt to see pt, now with PT, first attempt with RN doing assessment, will reattempt for swallow evaluation)   Claudie Fisherman, [facility] SLP [phone]

## 2014-10-24 NOTE — Progress Notes (Signed)
Preliminary Results: Carotid duplex completed. Bilateral ICA 1-39% stenosis with a moderate amount of heterogenous plaque visualized. [place]

## 2014-10-24 NOTE — Progress Notes (Signed)
[facility] PROGRESS NOTE  KEYARI KLEEMAN UVO:[id number] DOB: [birth year] DOA: 10/23/2014 PCP: Haywood Pao, MD  Assessment/Plan: #1 acute CVA: Bilateral anterior and posterior circulation punctuated infarcts largest in the right PCA, felt to be embolic source unknown suspicion for paroxysmal atrial fibrillation Patient with clinical improvement with some short-term memory loss. Per daughter patient's mentation at baseline. MRI showing bilateral anterior and posterior punctuated infarcts right PCA infarct and small vessel disease. MRA with no significant stenosis. 2-D echo is pending. Carotid Dopplers are pending. Patient for TEE tomorrow to look out for embolic source. If TEE is positive for PFO will need lower extremity Dopplers to rule out DVT. Continue aspirin for secondary stroke prevention. PT/OT. Neurology following and appreciate input and recommendations.  #2 hypertension Permissive hypertension, but gradually normalizing 5-7 days per neurology recommendations. Continue Lotensin.  #3 LDL > goal LDL was 77. Goal less than 70. Patient has been started on low-dose statin per neurology.  #4 tobacco abuse Tobacco cessation.  #5 minimally elevated troponin Patient currently asymptomatic. No chest pain. No shortness of breath. 2-D echo pending. Patient for TEE tomorrow. Cardiology following. Follow.  Code Status: DO NOT RESUSCITATE Family Communication: Updated patient and daughter at bedside. Disposition Plan: Remain inpatient.   Consultants:  Cardiology:  Neurology: Dr. Nicole Kindred 10/23/2014  Procedures:  CT head 10/23/2014  MRI MRA head 10/23/2014  Antibiotics:  None  HPI/Subjective: Patient feels she is better than on admission. Patient states her mental status is at her baseline and her daughter agrees with her. Patient coughing states is her smoker's cough which has been chronic in nature.  Objective: Filed Vitals:   10/24/14 1000  BP: 140/62  Pulse:  78  Temp: 97.8 F (36.6 C)  Resp: 14    Intake/Output Summary (Last 24 hours) at 10/24/14 1114 Last data filed at 10/23/14 1802  Gross per 24 hour  Intake      0 ml  Output      0 ml  Net      0 ml   Filed Weights   10/23/14 2147  Weight: 63.6 kg (140 lb 3.4 oz)    Exam:   General:  NAD  Cardiovascular: RRR  Respiratory: CTAB  Abdomen: Soft, nontender, nondistended, positive bowel sounds.  Musculoskeletal: No clubbing cyanosis or edema.  Data Reviewed: Basic Metabolic Panel:  Recent Labs Lab 10/23/14 1339 10/23/14 1941 10/24/14 0920  NA 135  --  136  K 4.3  --  4.2  CL 97  --  102  CO2 28  --  25  GLUCOSE 88  --  73  BUN 11  --  12  CREATININE 0.75 0.65 0.78  CALCIUM 9.5  --  8.9  MG  --   --  2.0   Liver Function Tests:  Recent Labs Lab 10/23/14 1339  AST 30  ALT 21  ALKPHOS 61  BILITOT 1.0  PROT 7.1  ALBUMIN 3.9   No results for input(s): LIPASE, AMYLASE in the last 168 hours. No results for input(s): AMMONIA in the last 168 hours. CBC:  Recent Labs Lab 10/23/14 1339 10/23/14 1941 10/24/14 0920  WBC 7.1 6.7 6.7  NEUTROABS 4.0  --   --   HGB 16.2* 15.7* 15.8*  HCT 46.8* 45.4 46.9*  MCV 95.5 93.0 95.1  PLT 234 212 226   Cardiac Enzymes:  Recent Labs Lab 10/23/14 1941 10/24/14 0123 10/24/14 0920  TROPONINI 0.18* 0.14* 0.16*   BNP (last 3 results) No results for input(s): BNP  in the last 8760 hours.  ProBNP (last 3 results) No results for input(s): PROBNP in the last 8760 hours.  CBG:  Recent Labs Lab 10/23/14 2238 10/24/14 0626  GLUCAP 93 76    No results found for this or any previous visit (from the past 240 hour(s)).   Studies: Ct Head (brain) Wo Contrast  10/23/2014   CLINICAL DATA:  81 year old female with acute altered mental status, confusion. Initial encounter.  EXAM: CT HEAD WITHOUT CONTRAST  TECHNIQUE: Contiguous axial images were obtained from the base of the skull through the vertex without intravenous  contrast.  COMPARISON:  None.  FINDINGS: Visualized paranasal sinuses and mastoids are clear. Visualized orbit soft tissues are within normal limits. Visualized scalp soft tissues are within normal limits. No acute osseous abnormality identified.  Confluent hypodensity involving gray and white-matter in the hip posterior right hemisphere, posterior right temporal lobe. No associated hemorrhage or mass effect. Calcified atherosclerosis at the skull base. No suspicious intracranial vascular hyperdensity. Possible small dural calcification on series 2, image 14 in this region, does not resemble a hyperdense vein.  Superimposed bilateral Patchy and confluent white matter hypodensity. Small linear hypodensity in the inferior cerebellum on the left (series 2, image 6). No midline shift, mass effect, or evidence of intracranial mass lesion. No acute intracranial hemorrhage identified.  IMPRESSION: 1. Acute to subacute posterior right MCA territory infarct involving the posterior right temporal lobe. No mass effect or hemorrhage. 2. Age indeterminate bilateral small vessel disease including in the left cerebellum.   Electronically Signed   By: Genevie Ann M.D.   On: 10/23/2014 15:22   Mr Brain Wo Contrast  10/23/2014   CLINICAL DATA:  Stroke.  EXAM: MRI HEAD WITHOUT CONTRAST  MRA HEAD WITHOUT CONTRAST  TECHNIQUE: Multiplanar, multiecho pulse sequences of the brain and surrounding structures were obtained without intravenous contrast. Angiographic images of the head were obtained using MRA technique without contrast.  COMPARISON:  Head CT from earlier the same day  FINDINGS: MRI HEAD FINDINGS  Calvarium and upper cervical spine: No marrow signal abnormality.  Orbits: No significant findings.  Sinuses: Clear. Mastoid and middle ears are clear.  Brain: There is a moderate area of restricted diffusion in the posterior right temporal lobe and parietal lobe consistent with acute infarct. This is in the posterior right MCA  territory and is near but less than 1/3 of the vascular territory. There are multiple additional foci of diffusion hyperintensity throughout the bilateral cerebral cortex, bilateral centrum semiovale, left corona radiata, and mid right cerebellum. One of the cortical infarcts is notably on the precentral gyrus. While the small areas are difficult to correlate on ADC, especially given motion, they are convincing for acute infarct. No hemorrhagic conversion is suspected. Mildly prominent serpiginous signal on SWI imaging is likely intravascular enhancement/ engorgement related to the neighboring infarct. The slowly flowing or thrombosed right M4 vessel near the infarct is visible on FLAIR imaging. There is a background of chronic small-vessel disease with ischemic gliosis present throughout the bilateral cerebral hemispheres. Small remote bilateral cerebellar infarcts, the largest 1 cm in the left tonsil.  MRA HEAD FINDINGS  Mild right vertebral artery dominance. There is narrowing of the left vertebral artery at the dural penetration which is mild. The left PICA originates near the dural penetration. The PICAs are symmetric and normal in appearance. Symmetric superior cerebellar and anterior inferior cerebellar arteries. Bilateral posterior communicating arteries, larger on the left. The PCAs are symmetric and unremarkable.  Balanced carotids.  There are likely infundibula of the posterior communicating artery origins. No definite aneurysm is identified. No major vessel/ treatable stenosis. No notable distal vascular irregularity. When accounting for fading signal at the M4 vessel level, a definite branch occlusion is not identified to correlate with the acute infarct and FLAIR abnormality.  IMPRESSION: 1. Acute infarcts in multiple vascular territories suggesting a cardioembolic process. A moderate sized infarct is present in the posterior right MCA territory; the remaining acute infarcts are punctate in size. 2.  Chronic small vessel disease and remote small vessel infarcts. 3. No treatable intracranial stenosis or significant atherosclerotic change.   Electronically Signed   By: Monte Fantasia M.D.   On: 10/23/2014 21:34   Mr Jodene Nam Head/brain Wo Cm  10/23/2014   CLINICAL DATA:  Stroke.  EXAM: MRI HEAD WITHOUT CONTRAST  MRA HEAD WITHOUT CONTRAST  TECHNIQUE: Multiplanar, multiecho pulse sequences of the brain and surrounding structures were obtained without intravenous contrast. Angiographic images of the head were obtained using MRA technique without contrast.  COMPARISON:  Head CT from earlier the same day  FINDINGS: MRI HEAD FINDINGS  Calvarium and upper cervical spine: No marrow signal abnormality.  Orbits: No significant findings.  Sinuses: Clear. Mastoid and middle ears are clear.  Brain: There is a moderate area of restricted diffusion in the posterior right temporal lobe and parietal lobe consistent with acute infarct. This is in the posterior right MCA territory and is near but less than 1/3 of the vascular territory. There are multiple additional foci of diffusion hyperintensity throughout the bilateral cerebral cortex, bilateral centrum semiovale, left corona radiata, and mid right cerebellum. One of the cortical infarcts is notably on the precentral gyrus. While the small areas are difficult to correlate on ADC, especially given motion, they are convincing for acute infarct. No hemorrhagic conversion is suspected. Mildly prominent serpiginous signal on SWI imaging is likely intravascular enhancement/ engorgement related to the neighboring infarct. The slowly flowing or thrombosed right M4 vessel near the infarct is visible on FLAIR imaging. There is a background of chronic small-vessel disease with ischemic gliosis present throughout the bilateral cerebral hemispheres. Small remote bilateral cerebellar infarcts, the largest 1 cm in the left tonsil.  MRA HEAD FINDINGS  Mild right vertebral artery dominance. There  is narrowing of the left vertebral artery at the dural penetration which is mild. The left PICA originates near the dural penetration. The PICAs are symmetric and normal in appearance. Symmetric superior cerebellar and anterior inferior cerebellar arteries. Bilateral posterior communicating arteries, larger on the left. The PCAs are symmetric and unremarkable.  Balanced carotids. There are likely infundibula of the posterior communicating artery origins. No definite aneurysm is identified. No major vessel/ treatable stenosis. No notable distal vascular irregularity. When accounting for fading signal at the M4 vessel level, a definite branch occlusion is not identified to correlate with the acute infarct and FLAIR abnormality.  IMPRESSION: 1. Acute infarcts in multiple vascular territories suggesting a cardioembolic process. A moderate sized infarct is present in the posterior right MCA territory; the remaining acute infarcts are punctate in size. 2. Chronic small vessel disease and remote small vessel infarcts. 3. No treatable intracranial stenosis or significant atherosclerotic change.   Electronically Signed   By: Monte Fantasia M.D.   On: 10/23/2014 21:34    Scheduled Meds: . aspirin  300 mg Rectal Daily   Or  . aspirin  325 mg Oral Daily  . benazepril  20 mg Oral Daily  . enoxaparin (LOVENOX) injection  40 mg Subcutaneous Q24H   Continuous Infusions:   Principal Problem:   Acute CVA (cerebrovascular accident) Active Problems:   Rheumatoid arthritis   Essential hypertension   Tobacco abuse    Time spent: 35 minutes    THOMPSON,DANIEL M.D. [facility] Pager [phone]. If 7PM-7AM, please contact night-coverage at [email or web address], password [facility] 10/24/2014, 11:14 AM  LOS: 1 day

## 2014-10-24 NOTE — Evaluation (Signed)
Speech Language Pathology Evaluation Patient Details Name: Debbie Kramer MRN: [id number] DOB: Oct 03, 1925 Today's Date: 10/24/2014 Time: 5462-7035 SLP Time Calculation (min) (ACUTE ONLY): 20 min  Problem List:  Patient Active Problem List   Diagnosis Date Noted  . Acute CVA (cerebrovascular accident) 10/23/2014  . Essential hypertension 10/23/2014  . Tobacco abuse 10/23/2014  . Rheumatoid arthritis 03/04/2011  . Osteoarthritis 03/04/2011  . Degenerative disc disease 03/04/2011   Past Medical History:  Past Medical History  Diagnosis Date  . Rheumatoid arthritis(714.0)   . Hypertension   . Degenerative disc disease    Past Surgical History:  Past Surgical History  Procedure Laterality Date  . Hernia repair    . Knee surgery    . Cholecystectomy     HPI:  81 y.o. female with a Past Medical History of hypertension, rheumatoid arthritis, ongoing tobacco abuse who presented yesterday disorientation, confusion, problems putting words in place in her mind per MD note.  A CT scan of the head showed findings consistent with a right posterior MCA ischemic infarct.  Swallow evaluation ordered as pt presented with lung sound changes with stroke swallow screen.     Assessment / Plan / Recommendation Clinical Impression  Pt presents with functional speech/language skills for current environment.  Cognition appears adequate for current environment.  She was oriented to person, place independently, date (with calendar/clock) and situation with mod I.    PT noted left side visual field cut during session - not observed by SLP during our session.  Daughter Mortimer Fries present and reports pt at baseline level of function from her observation.   Advised daughter/pt to have someone assure she is taking her medications adequately and left field cup per PT notes.   She may benefit from 24/7 supervision due to decreased left side field cut.    Pt reports using microwave some at home for cooking and  managing her own medications but daughter with whom she resides manages shopping, bill paying, etc.  SLP to follow up briefly for pt's swallow function only unless OT notes difficulties that warrant further SLP.  Spoke to OT prior to her session with pt this am.   Daughter and pt report cognitive linguistic skills to be at baseline level.  Thanks for this consult.       SLP Assessment  Patient does not need any further Speech Lanaguage Pathology Services    Follow Up Recommendations  None    Frequency and Duration min 1 x/week      Pertinent Vitals/Pain Pain Assessment: No/denies pain   SLP Goals  Progression toward goals: Progressing toward goals Patient/Family Stated Goal: to eat  SLP Evaluation Prior Functioning  Cognitive/Linguistic Baseline: Within functional limits Type of Home: House  Lives With: Daughter Available Help at Discharge: Available PRN/intermittently;Family Education: 10th grade, worked as Chartered certified accountant, Psychiatric nurse, and Theatre stage manager  Overall Cognitive Status: Impaired/Different from baseline Arousal/Alertness: Awake/alert Orientation Level: Oriented to person;Oriented to place;Oriented to time;Oriented to situation (time and situation with cues) Attention: Sustained (did not observe left inattention during session today) Memory: Appears intact Awareness: Appears intact    Comprehension  Auditory Comprehension Overall Auditory Comprehension: Appears within functional limits for tasks assessed Commands: Within Functional Limits Conversation: Simple Visual Recognition/Discrimination Discrimination: Not tested Reading Comprehension Reading Status: Within funtional limits (for reading calendar, pt reports she does word puzzles at home but does not read other items)    Expression Expression Primary Mode of Expression: Verbal Verbal Expression Overall  Verbal Expression: Appears within functional limits for tasks assessed Initiation: No  impairment Level of Generative/Spontaneous Verbalization: Conversation Naming: Not tested Pragmatics: No impairment Written Expression Dominant Hand: Right Written Expression: Not tested   Oral / Motor Oral Motor/Sensory Function Overall Oral Motor/Sensory Function:  (? mild right upper labial asymmetry, family *dtr Mortimer Fries reports is baseline) Retail buyer Overall Motor Speech: Appears within functional limits for tasks assessed   [place], Velma [facility] SLP [phone]

## 2014-10-24 NOTE — Progress Notes (Signed)
    [facility] has been requested to perform a transesophageal echocardiogram on Debbie Kramer for stroke work up .  After careful review of history and examination, the risks and benefits of transesophageal echocardiogram have been explained including risks of esophageal damage, perforation (1:10,000 risk), bleeding, pharyngeal hematoma as well as other potential complications associated with conscious sedation including aspiration, arrhythmia, respiratory failure and death. Alternatives to treatment were discussed, questions were answered. Patient is willing to proceed.   Tarri Fuller, PA-C 10/24/2014 2:09 PM

## 2014-10-25 ENCOUNTER — Encounter (HOSPITAL_COMMUNITY)
Admission: EM | Disposition: A | Discharge status: Home / Self Care | Payer: Self-pay | Source: Home / Self Care | Attending: Internal Medicine

## 2014-10-25 ENCOUNTER — Encounter (HOSPITAL_COMMUNITY): Payer: Self-pay | Admitting: Nurse Practitioner

## 2014-10-25 DIAGNOSIS — I639 Cerebral infarction, unspecified: Secondary | ICD-10-CM

## 2014-10-25 HISTORY — PX: LOOP RECORDER IMPLANT: SHX5477

## 2014-10-25 LAB — HEMOGLOBIN A1C
Hgb A1c MFr Bld: 5.6 % (ref 4.8–5.6)
Mean Plasma Glucose: 114 mg/dL

## 2014-10-25 LAB — CBC
HCT: 43 % (ref 36.0–46.0)
Hemoglobin: 14.3 g/dL (ref 12.0–15.0)
MCH: 31.8 pg (ref 26.0–34.0)
MCHC: 33.3 g/dL (ref 30.0–36.0)
MCV: 95.6 fL (ref 78.0–100.0)
PLATELETS: 210 10*3/uL (ref 150–400)
RBC: 4.5 MIL/uL (ref 3.87–5.11)
RDW: 14.5 % (ref 11.5–15.5)
WBC: 6 10*3/uL (ref 4.0–10.5)

## 2014-10-25 LAB — BASIC METABOLIC PANEL
ANION GAP: 6 (ref 5–15)
BUN: 15 mg/dL (ref 6–23)
CHLORIDE: 101 mmol/L (ref 96–112)
CO2: 28 mmol/L (ref 19–32)
Calcium: 8.6 mg/dL (ref 8.4–10.5)
Creatinine, Ser: 0.74 mg/dL (ref 0.50–1.10)
GFR, EST AFRICAN AMERICAN: 86 mL/min — AB (ref >90)
GFR, EST NON AFRICAN AMERICAN: 74 mL/min — AB (ref >90)
Glucose, Bld: 94 mg/dL (ref 70–99)
Potassium: 4.1 mmol/L (ref 3.5–5.1)
Sodium: 135 mmol/L (ref 135–145)

## 2014-10-25 LAB — GLUCOSE, CAPILLARY
Glucose-Capillary: 89 mg/dL (ref 70–99)
Glucose-Capillary: 88 mg/dL (ref 70–99)
Glucose-Capillary: 153 mg/dL — ABNORMAL HIGH (ref 70–99)

## 2014-10-25 SURGERY — LOOP RECORDER IMPLANT
Anesthesia: LOCAL

## 2014-10-25 SURGERY — ECHOCARDIOGRAM, TRANSESOPHAGEAL
Anesthesia: Moderate Sedation

## 2014-10-25 MED ORDER — SIMVASTATIN 10 MG PO TABS: 10.0000 mg | ORAL_TABLET | Freq: Every day | ORAL | Status: DC

## 2014-10-25 MED ORDER — LIDOCAINE-EPINEPHRINE 1 %-1:100000 IJ SOLN
INTRAMUSCULAR | Status: AC
  Filled 2014-10-25: qty 1

## 2014-10-25 MED ORDER — ASPIRIN 325 MG PO TABS: 325.0000 mg | ORAL_TABLET | Freq: Every day | ORAL | Status: DC

## 2014-10-25 NOTE — Discharge Summary (Signed)
Physician Discharge Summary  Debbie Kramer PXT:[id number] DOB: 1925/11/12 DOA: 10/23/2014  PCP: Haywood Pao, MD  Admit date: 10/23/2014 Discharge date: 10/25/2014  Time spent: 65 minutes  Recommendations for Outpatient Follow-up:  1. Follow-up with Dr. Leonie Man of neurology in 2 months. 2. Follow-up with Haywood Pao, MD in 1 week for hospital follow-up. Patient will need risk factor modification. 3. Follow-up with cardiology on 11/05/2014 for wound check for implantable loop recorder.  Discharge Diagnoses:  Principal Problem:   Acute CVA (cerebrovascular accident) Active Problems:   Rheumatoid arthritis   Essential hypertension   Tobacco abuse   Discharge Condition: Stable and improved  Diet recommendation: Heart healthy  Filed Weights   10/23/14 2147  Weight: 63.6 kg (140 lb 3.4 oz)    History of present illness:   per Dr. [name] is a 81 y.o. female with a Past Medical History of hypertension, rheumatoid arthritis, ongoing tobacco abuse who presented to ED with confusion. Per patient, since one day prior to admission, she has had disorientation and confusion.she normally plays crossword puzzle, and the day prior to admission, she was having difficulty putting words in place in her mind-although she did not have any speech difficulty. She also noted by family to have difficulty driving and seemed more forgetful than usual. She subsequently was brought to the ED, where a CT scan of the head showed findings consistent with a right posterior MCA ischemic infarct. The hospitalist service was asked to admit this patient for further evaluation and treatment No history of fever, chest pain, shortness of breath, nausea, vomiting, diarrhea or dysuria. There is no history of abdominal pain  Hospital Course:  #1 acute CVA: Bilateral anterior and posterior circulation punctuated infarcts largest in the right PCA, felt to be embolic source unknown suspicion for  paroxysmal atrial fibrillation Patient had presented with confusion and speech difficulties and a such patient was admitted for stroke workup. MRI of the head which was done showed bilateral anterior posterior punctuated infarcts and right PCA infarct with small vessel disease. Patient improved clinically during the hospitalization was back to her baseline by day of discharge. Carotid Dopplers were obtained with no significant ICA stenosis. 2-D echo was done and results were pending at time of discharge. Hemoglobin A1c which was done was 5.6. TEE was ordered due to concern for embolic strokes however patient refused the TEE. Patient was agreeable to implantable loop recorder which was done chest prior to discharge by cardiology area patient was placed on a full dose aspirin for secondary stroke prevention. Patient was also started on low dose statin. Patient will be discharged in stable condition. PT OT recommended outpatient therapies however patient refused. Patient will follow-up with neurology as outpatient in 2 months. Patient will be discharged in stable and improved condition.  #2 hypertension Permissive hypertension, but gradually normalizing 5-7 days per neurology recommendations. Continued on Lotensin.  #3 LDL > goal LDL was 77. Goal less than 70. Patient was started on low-dose statin per neurology.  #4 tobacco abuse Tobacco cessation.  #5 minimally elevated troponin Patient remained asymptomatic. No chest pain. No shortness of breath. 2-D echo was done and results were pending at time of discharge. TEE was ordered however patient refused. Patient was seen in consultation by cardiology and patient will follow-up with PCP as outpatient. pending.    Procedures:  CT head 10/23/2014  MRI MRA head 10/23/2014  2-D echo 10/25/2014  Carotid Dopplers 10/24/2014  Implantable loop recorder 10/25/2014 per Dr. Caryl Comes  Consultations:  Cardiology: Dr Caryl Comes 10/25/14  Neurology: Dr. Nicole Kindred  10/23/2014  Discharge Exam: Filed Vitals:   10/25/14 1414  BP: 166/59  Pulse: 68  Temp: 97.5 F (36.4 C)  Resp: 18    General: NAD Cardiovascular: RRR Respiratory: CTAB  Discharge Instructions   Discharge Instructions    Ambulatory referral to Neurology    Complete by:  As directed   Please schedule post stroke follow up in 2 months.     Diet - low sodium heart healthy    Complete by:  As directed      Discharge instructions    Complete by:  As directed   Follow up with Haywood Pao, MD in 1-2 weeks. Follow up with Dr Leonie Man in 2 months     Increase activity slowly    Complete by:  As directed           Current Discharge Medication List    START taking these medications   Details  aspirin 325 MG tablet Take 1 tablet (325 mg total) by mouth daily.    simvastatin (ZOCOR) 10 MG tablet Take 1 tablet (10 mg total) by mouth daily at 6 PM. Qty: 30 tablet, Refills: 0      CONTINUE these medications which have NOT CHANGED   Details  !! alendronate (FOSAMAX) 70 MG tablet Take 70 mg by mouth once a week. Take with a full glass of water on an empty stomach.    benazepril (LOTENSIN) 20 MG tablet Take 20 mg by mouth daily.      Calcium Citrate-Vitamin D (CITRACAL PETITES/VITAMIN D PO) Take by mouth.      folic acid (FOLVITE) 1 MG tablet Take 1 mg by mouth daily.    InFLIXimab (REMICADE IV) Inject 200 mg into the vein every 8 (eight) weeks.     meloxicam (MOBIC) 15 MG tablet Take 15 mg by mouth daily.    methotrexate 2.5 MG tablet Take 2.5 mg by mouth once a week.    Multiple Vitamin (MULTIVITAMIN PO) Take 1 capsule by mouth daily.     !! ALENDRONATE SODIUM PO Take by mouth.       !! - Potential duplicate medications found. Please discuss with provider.     No Known Allergies Follow-up Information    Follow up with [facility] On 11/05/2014.   Why:  at 11AM for wound check   Contact information:   [place]  [id number]       Follow up with Antony Contras, MD In 2 months.   Specialties:  Neurology, Radiology   Why:  Stroke Clinic, Office will call you with appointment date & time   Contact information:   [place] [address] [phone]       Follow up with Haywood Pao, MD. Schedule an appointment as soon as possible for a visit in 1 week.   Specialty:  Internal Medicine   Contact information:   [place] [id number] [phone]        The results of significant diagnostics from this hospitalization (including imaging, microbiology, ancillary and laboratory) are listed below for reference.    Significant Diagnostic Studies: Ct Head (brain) Wo Contrast  10/23/2014   CLINICAL DATA:  81 year old female with acute altered mental status, confusion. Initial encounter.  EXAM: CT HEAD WITHOUT CONTRAST  TECHNIQUE: Contiguous axial images were obtained from the base of the skull through the vertex without intravenous contrast.  COMPARISON:  None.  FINDINGS: Visualized paranasal sinuses and mastoids are clear. Visualized orbit soft tissues are within normal limits. Visualized scalp soft tissues are within normal limits. No acute osseous abnormality identified.  Confluent hypodensity involving gray and white-matter in the hip posterior right hemisphere, posterior right temporal lobe. No associated hemorrhage or mass effect. Calcified atherosclerosis at the skull base. No suspicious intracranial vascular hyperdensity. Possible small dural calcification on series 2, image 14 in this region, does not resemble a hyperdense vein.  Superimposed bilateral Patchy and confluent white matter hypodensity. Small linear hypodensity in the inferior cerebellum on the left (series 2, image 6). No midline shift, mass effect, or evidence of intracranial mass lesion. No acute intracranial hemorrhage identified.  IMPRESSION: 1. Acute to subacute posterior right MCA territory  infarct involving the posterior right temporal lobe. No mass effect or hemorrhage. 2. Age indeterminate bilateral small vessel disease including in the left cerebellum.   Electronically Signed   By: Genevie Ann M.D.   On: 10/23/2014 15:22   Mr Brain Wo Contrast  10/23/2014   CLINICAL DATA:  Stroke.  EXAM: MRI HEAD WITHOUT CONTRAST  MRA HEAD WITHOUT CONTRAST  TECHNIQUE: Multiplanar, multiecho pulse sequences of the brain and surrounding structures were obtained without intravenous contrast. Angiographic images of the head were obtained using MRA technique without contrast.  COMPARISON:  Head CT from earlier the same day  FINDINGS: MRI HEAD FINDINGS  Calvarium and upper cervical spine: No marrow signal abnormality.  Orbits: No significant findings.  Sinuses: Clear. Mastoid and middle ears are clear.  Brain: There is a moderate area of restricted diffusion in the posterior right temporal lobe and parietal lobe consistent with acute infarct. This is in the posterior right MCA territory and is near but less than 1/3 of the vascular territory. There are multiple additional foci of diffusion hyperintensity throughout the bilateral cerebral cortex, bilateral centrum semiovale, left corona radiata, and mid right cerebellum. One of the cortical infarcts is notably on the precentral gyrus. While the small areas are difficult to correlate on ADC, especially given motion, they are convincing for acute infarct. No hemorrhagic conversion is suspected. Mildly prominent serpiginous signal on SWI imaging is likely intravascular enhancement/ engorgement related to the neighboring infarct. The slowly flowing or thrombosed right M4 vessel near the infarct is visible on FLAIR imaging. There is a background of chronic small-vessel disease with ischemic gliosis present throughout the bilateral cerebral hemispheres. Small remote bilateral cerebellar infarcts, the largest 1 cm in the left tonsil.  MRA HEAD FINDINGS  Mild right vertebral  artery dominance. There is narrowing of the left vertebral artery at the dural penetration which is mild. The left PICA originates near the dural penetration. The PICAs are symmetric and normal in appearance. Symmetric superior cerebellar and anterior inferior cerebellar arteries. Bilateral posterior communicating arteries, larger on the left. The PCAs are symmetric and unremarkable.  Balanced carotids. There are likely infundibula of the posterior communicating artery origins. No definite aneurysm is identified. No major vessel/ treatable stenosis. No notable distal vascular irregularity. When accounting for fading signal at the M4 vessel level, a definite branch occlusion is not identified to correlate with the acute infarct and FLAIR abnormality.  IMPRESSION: 1. Acute infarcts in multiple vascular territories suggesting a cardioembolic process. A moderate sized infarct is present in the posterior right MCA territory; the remaining acute infarcts are punctate in size. 2. Chronic small vessel disease and remote small vessel infarcts. 3. No treatable intracranial stenosis or significant atherosclerotic change.  Electronically Signed   By: Monte Fantasia M.D.   On: 10/23/2014 21:34   Mr Jodene Nam Head/brain Wo Cm  10/23/2014   CLINICAL DATA:  Stroke.  EXAM: MRI HEAD WITHOUT CONTRAST  MRA HEAD WITHOUT CONTRAST  TECHNIQUE: Multiplanar, multiecho pulse sequences of the brain and surrounding structures were obtained without intravenous contrast. Angiographic images of the head were obtained using MRA technique without contrast.  COMPARISON:  Head CT from earlier the same day  FINDINGS: MRI HEAD FINDINGS  Calvarium and upper cervical spine: No marrow signal abnormality.  Orbits: No significant findings.  Sinuses: Clear. Mastoid and middle ears are clear.  Brain: There is a moderate area of restricted diffusion in the posterior right temporal lobe and parietal lobe consistent with acute infarct. This is in the posterior  right MCA territory and is near but less than 1/3 of the vascular territory. There are multiple additional foci of diffusion hyperintensity throughout the bilateral cerebral cortex, bilateral centrum semiovale, left corona radiata, and mid right cerebellum. One of the cortical infarcts is notably on the precentral gyrus. While the small areas are difficult to correlate on ADC, especially given motion, they are convincing for acute infarct. No hemorrhagic conversion is suspected. Mildly prominent serpiginous signal on SWI imaging is likely intravascular enhancement/ engorgement related to the neighboring infarct. The slowly flowing or thrombosed right M4 vessel near the infarct is visible on FLAIR imaging. There is a background of chronic small-vessel disease with ischemic gliosis present throughout the bilateral cerebral hemispheres. Small remote bilateral cerebellar infarcts, the largest 1 cm in the left tonsil.  MRA HEAD FINDINGS  Mild right vertebral artery dominance. There is narrowing of the left vertebral artery at the dural penetration which is mild. The left PICA originates near the dural penetration. The PICAs are symmetric and normal in appearance. Symmetric superior cerebellar and anterior inferior cerebellar arteries. Bilateral posterior communicating arteries, larger on the left. The PCAs are symmetric and unremarkable.  Balanced carotids. There are likely infundibula of the posterior communicating artery origins. No definite aneurysm is identified. No major vessel/ treatable stenosis. No notable distal vascular irregularity. When accounting for fading signal at the M4 vessel level, a definite branch occlusion is not identified to correlate with the acute infarct and FLAIR abnormality.  IMPRESSION: 1. Acute infarcts in multiple vascular territories suggesting a cardioembolic process. A moderate sized infarct is present in the posterior right MCA territory; the remaining acute infarcts are punctate in  size. 2. Chronic small vessel disease and remote small vessel infarcts. 3. No treatable intracranial stenosis or significant atherosclerotic change.   Electronically Signed   By: Monte Fantasia M.D.   On: 10/23/2014 21:34    Microbiology: No results found for this or any previous visit (from the past 240 hour(s)).   Labs: Basic Metabolic Panel:  Recent Labs Lab 10/23/14 1339 10/23/14 1941 10/24/14 0920 10/25/14 0700  NA 135  --  136 135  K 4.3  --  4.2 4.1  CL 97  --  102 101  CO2 28  --  25 28  GLUCOSE 88  --  73 94  BUN 11  --  12 15  CREATININE 0.75 0.65 0.78 0.74  CALCIUM 9.5  --  8.9 8.6  MG  --   --  2.0  --    Liver Function Tests:  Recent Labs Lab 10/23/14 1339  AST 30  ALT 21  ALKPHOS 61  BILITOT 1.0  PROT 7.1  ALBUMIN 3.9   No  results for input(s): LIPASE, AMYLASE in the last 168 hours. No results for input(s): AMMONIA in the last 168 hours. CBC:  Recent Labs Lab 10/23/14 1339 10/23/14 1941 10/24/14 0920 10/25/14 0700  WBC 7.1 6.7 6.7 6.0  NEUTROABS 4.0  --   --   --   HGB 16.2* 15.7* 15.8* 14.3  HCT 46.8* 45.4 46.9* 43.0  MCV 95.5 93.0 95.1 95.6  PLT 234 212 226 210   Cardiac Enzymes:  Recent Labs Lab 10/23/14 1941 10/24/14 0123 10/24/14 0920  TROPONINI 0.18* 0.14* 0.16*   BNP: BNP (last 3 results) No results for input(s): BNP in the last 8760 hours.  ProBNP (last 3 results) No results for input(s): PROBNP in the last 8760 hours.  CBG:  Recent Labs Lab 10/24/14 1117 10/24/14 1617 10/24/14 2116 10/25/14 0704 10/25/14 1126  GLUCAP 123* 91 91 89 88       Signed:  Saralee Bolick MD [facility] 10/25/2014, 4:36 PM

## 2014-10-25 NOTE — Progress Notes (Signed)
Speech Language Pathology Treatment: Dysphagia  Patient Details Name: Debbie Kramer MRN: [id number] DOB: November 08, 1925 Today's Date: 10/25/2014 Time: 3235-5732 SLP Time Calculation (min) (ACUTE ONLY): 30 min  Assessment / Plan / Recommendation Clinical Impression  Pt seen to assure tolerance of po diet and for education given pt extensive smoking hx.  Observed pt taking her medicine with water given by RN - no s/s of aspiration and pt took several pills at one time.  RN listened to pt's lungs reporting that she was wheezing but did not have rales/crackles.    Pt reports swallow function to be at baseline.   SLP reviewed aspiration precautions with pt having her read handout to mitigate risk.  Using teach back information was reinforced.    Will sign off at this time as pt appears to be tolerating po diet.  Thanks for allowing me to help with this pt's care plan.     HPI HPI: 81 y.o. female with a Past Medical History of hypertension, rheumatoid arthritis, ongoing tobacco abuse who presented yesterday disorientation, confusion, problems putting words in place in her mind per MD note.  A CT scan of the head showed findings consistent with a right posterior MCA ischemic infarct.  Swallow evaluation ordered as pt presented with lung sound changes with stroke swallow screen.     Pertinent Vitals Pain Assessment: No/denies pain  SLP Plan  All goals met    Recommendations Diet recommendations: Regular;Thin liquid Liquids provided via: Cup;Straw Medication Administration: Whole meds with liquid Supervision: Patient able to self feed Compensations: Slow rate;Small sips/bites Postural Changes and/or Swallow Maneuvers: Seated upright 90 degrees;Upright 30-60 min after meal              Oral Care Recommendations: Oral care BID Follow up Recommendations: None Plan: All goals met    [place], [place] [facility] [place]

## 2014-10-25 NOTE — Progress Notes (Signed)
Patient and family refused TEE this AM. MD aware. Amber (PA) at bedside at this this time. Patient is aware to have loop recorder implanted and will wait for daughter  Jolayne Haines) to come and sign consent.   Ave Filter, RN

## 2014-10-25 NOTE — CV Procedure (Signed)
Pre op Dx Cryptogenic Stroke  Post op Dx Cryptogenic Stroke   Procedure  Loop Recorder implantation  After routine prep and drape of the left parasternal area, a small incision was created. A [employer] LINQ Reveal Loop Recorder  Serial Number  [id number] was inserted.    SteriStrip dressing was  applied.  The patient tolerated the procedure without apparent complication.

## 2014-10-25 NOTE — Progress Notes (Signed)
Echocardiogram 2D Echocardiogram has been performed.  Debbie Kramer 10/25/2014, 10:39 AM

## 2014-10-25 NOTE — Consult Note (Signed)
ELECTROPHYSIOLOGY CONSULT NOTE  Patient ID: ROLANDE MOE MRN: [id number], DOB/AGE: 04-Apr-1926   Admit date: 10/23/2014 Date of Consult: 10/25/2014  Primary Physician: Haywood Pao, MD Primary Cardiologist: new to [facility] Reason for Consultation: Cryptogenic stroke; recommendations regarding Implantable Loop Recorder  History of Present Illness SHANAY WOOLMAN was admitted on 10/23/2014 with confusion.  Imaging demonstrated ..>> Acute infarcts in multiple vascular territories suggesting a cardioembolic process. A moderate sized infarct is present in the posterior right MCA territory; the remaining acute infarcts are punctate in size.    She has undergone workup for stroke including carotid dopplers.  The patient has been monitored on telemetry which has demonstrated sinus rhythm with no arrhythmias.  Inpatient stroke work-up was recommended to be completed with TEE but the patient and family do not want to have this procedure done.   Echocardiogram pending this admission.   Lab work is reviewed.  Prior to admission, the patient denies chest pain, shortness of breath, dizziness, palpitations, or syncope.  They are recovering from their stroke with plans to return home at discharge.  EP has been asked to evaluate for placement of an implantable loop recorder to monitor for atrial fibrillation.  ROS is negative except as outlined above.    Past Medical History  Diagnosis Date  . Rheumatoid arthritis(714.0)   . Hypertension   . Degenerative disc disease      Surgical History:  Past Surgical History  Procedure Laterality Date  . Hernia repair    . Knee surgery    . Cholecystectomy       Prescriptions prior to admission  Medication Sig Dispense Refill Last Dose  . alendronate (FOSAMAX) 70 MG tablet Take 70 mg by mouth once a week. Take with a full glass of water on an empty stomach.   Past Week at Unknown time  . benazepril (LOTENSIN) 20 MG tablet Take 20 mg by  mouth daily.     10/23/2014 at Unknown time  . Calcium Citrate-Vitamin D (CITRACAL PETITES/VITAMIN D PO) Take by mouth.     10/23/2014 at Unknown time  . folic acid (FOLVITE) 1 MG tablet Take 1 mg by mouth daily.   10/23/2014 at Unknown time  . InFLIXimab (REMICADE IV) Inject 200 mg into the vein every 8 (eight) weeks.    Past Month at Unknown time  . meloxicam (MOBIC) 15 MG tablet Take 15 mg by mouth daily.   10/23/2014 at Unknown time  . methotrexate 2.5 MG tablet Take 2.5 mg by mouth once a week.   Past Week at Unknown time  . Multiple Vitamin (MULTIVITAMIN PO) Take 1 capsule by mouth daily.    10/23/2014 at Unknown time  . ALENDRONATE SODIUM PO Take by mouth.         Inpatient Medications:  . aspirin  300 mg Rectal Daily   Or  . aspirin  325 mg Oral Daily  . benazepril  20 mg Oral Daily  . enoxaparin (LOVENOX) injection  40 mg Subcutaneous Q24H  . nicotine  21 mg Transdermal Daily  . simvastatin  10 mg Oral q1800    Allergies: No Known Allergies  History   Social History  . Marital Status: Married    Spouse Name: N/A  . Number of Children: N/A  . Years of Education: N/A   Occupational History  . Not on file.   Social History Main Topics  . Smoking status: Current Every Day Smoker -- 0.50 packs/day for 50 years  Types: Cigarettes  . Smokeless tobacco: Not on file  . Alcohol Use: No  . Drug Use: No  . Sexual Activity: Not on file   Other Topics Concern  . Not on file   Social History Narrative     Family History  Problem Relation Age of Onset  . Stroke Father      Physical Exam: Filed Vitals:   10/24/14 1753 10/24/14 2106 10/25/14 0137 10/25/14 0619  BP: 139/52 153/52 141/59 153/61  Pulse: 75 71 67 61  Temp: 98 F (36.7 C) 97.7 F (36.5 C) 98.1 F (36.7 C) 98 F (36.7 C)  TempSrc: Oral Oral Oral Oral  Resp: 17 18 16 15   Height:      Weight:      SpO2: 99% 97% 100% 99%    GEN- The patient is well appearing, alert and oriented x 3 today.   Head-  normocephalic, atraumatic Eyes-  Sclera clear, conjunctiva pink Ears- hearing intact Oropharynx- clear Neck- supple, Lungs- Clear to ausculation bilaterally, normal work of breathing Heart- Regular rate and rhythm, no murmurs, rubs or gallops GI- soft, NT, ND, + BS Extremities- no clubbing, cyanosis, or edema MS- no significant deformity or atrophy Skin- no rash or lesion Psych- euthymic mood, full affect   Labs:   Lab Results  Component Value Date   WBC 6.7 10/24/2014   HGB 15.8* 10/24/2014   HCT 46.9* 10/24/2014   MCV 95.1 10/24/2014   PLT 226 10/24/2014    Recent Labs Lab 10/23/14 1339  10/24/14 0920  NA 135  --  136  K 4.3  --  4.2  CL 97  --  102  CO2 28  --  25  BUN 11  --  12  CREATININE 0.75  < > 0.78  CALCIUM 9.5  --  8.9  PROT 7.1  --   --   BILITOT 1.0  --   --   ALKPHOS 61  --   --   ALT 21  --   --   AST 30  --   --   GLUCOSE 88  --  73  < > = values in this interval not displayed.   Radiology/Studies: Ct Head (brain) Wo Contrast 10/23/2014   CLINICAL DATA:  81 year old female with acute altered mental status, confusion. Initial encounter.  EXAM: CT HEAD WITHOUT CONTRAST  TECHNIQUE: Contiguous axial images were obtained from the base of the skull through the vertex without intravenous contrast.  COMPARISON:  None.  FINDINGS: Visualized paranasal sinuses and mastoids are clear. Visualized orbit soft tissues are within normal limits. Visualized scalp soft tissues are within normal limits. No acute osseous abnormality identified.  Confluent hypodensity involving gray and white-matter in the hip posterior right hemisphere, posterior right temporal lobe. No associated hemorrhage or mass effect. Calcified atherosclerosis at the skull base. No suspicious intracranial vascular hyperdensity. Possible small dural calcification on series 2, image 14 in this region, does not resemble a hyperdense vein.  Superimposed bilateral Patchy and confluent white matter hypodensity.  Small linear hypodensity in the inferior cerebellum on the left (series 2, image 6). No midline shift, mass effect, or evidence of intracranial mass lesion. No acute intracranial hemorrhage identified.  IMPRESSION: 1. Acute to subacute posterior right MCA territory infarct involving the posterior right temporal lobe. No mass effect or hemorrhage. 2. Age indeterminate bilateral small vessel disease including in the left cerebellum.   Electronically Signed   By: Genevie Ann M.D.   On: 10/23/2014 15:22  Mr Brain Wo Contrast 10/23/2014   CLINICAL DATA:  Stroke.  EXAM: MRI HEAD WITHOUT CONTRAST  MRA HEAD WITHOUT CONTRAST  TECHNIQUE: Multiplanar, multiecho pulse sequences of the brain and surrounding structures were obtained without intravenous contrast. Angiographic images of the head were obtained using MRA technique without contrast.  COMPARISON:  Head CT from earlier the same day  FINDINGS: MRI HEAD FINDINGS  Calvarium and upper cervical spine: No marrow signal abnormality.  Orbits: No significant findings.  Sinuses: Clear. Mastoid and middle ears are clear.  Brain: There is a moderate area of restricted diffusion in the posterior right temporal lobe and parietal lobe consistent with acute infarct. This is in the posterior right MCA territory and is near but less than 1/3 of the vascular territory. There are multiple additional foci of diffusion hyperintensity throughout the bilateral cerebral cortex, bilateral centrum semiovale, left corona radiata, and mid right cerebellum. One of the cortical infarcts is notably on the precentral gyrus. While the small areas are difficult to correlate on ADC, especially given motion, they are convincing for acute infarct. No hemorrhagic conversion is suspected. Mildly prominent serpiginous signal on SWI imaging is likely intravascular enhancement/ engorgement related to the neighboring infarct. The slowly flowing or thrombosed right M4 vessel near the infarct is visible on FLAIR  imaging. There is a background of chronic small-vessel disease with ischemic gliosis present throughout the bilateral cerebral hemispheres. Small remote bilateral cerebellar infarcts, the largest 1 cm in the left tonsil.  MRA HEAD FINDINGS  Mild right vertebral artery dominance. There is narrowing of the left vertebral artery at the dural penetration which is mild. The left PICA originates near the dural penetration. The PICAs are symmetric and normal in appearance. Symmetric superior cerebellar and anterior inferior cerebellar arteries. Bilateral posterior communicating arteries, larger on the left. The PCAs are symmetric and unremarkable.  Balanced carotids. There are likely infundibula of the posterior communicating artery origins. No definite aneurysm is identified. No major vessel/ treatable stenosis. No notable distal vascular irregularity. When accounting for fading signal at the M4 vessel level, a definite branch occlusion is not identified to correlate with the acute infarct and FLAIR abnormality.  IMPRESSION: 1. Acute infarcts in multiple vascular territories suggesting a cardioembolic process. A moderate sized infarct is present in the posterior right MCA territory; the remaining acute infarcts are punctate in size. 2. Chronic small vessel disease and remote small vessel infarcts. 3. No treatable intracranial stenosis or significant atherosclerotic change.   Electronically Signed   By: Monte Fantasia M.D.   On: 10/23/2014 21:34   12-lead ECG sinus rhythm, rate 91, poor R wave progression All prior EKG's in EPIC reviewed with no documented atrial fibrillation  Telemetry sinus rhythm with PVC's  Assessment and Plan:  1. Cryptogenic stroke The patient presents with cryptogenic stroke. Echocardiogram is pending, the patient is currently refusing TEE.  I spoke at length with the patient about monitoring for afib with an implantable loop recorder.  Risks, benefits, and alteratives to implantable loop  recorder were discussed with the patient today.   At this time, the patient is very clear in their decision to proceed with implantable loop recorder.   Wound care was reviewed with the patient (keep incision clean and dry for 3 days).  Wound check scheduled for April 4th at 11AM.   Please call with questions.  Discussed above with neurology, they are ok with proceeding with LINQ after transthoracic echocardiogram.    Chanetta Marshall, NP 10/25/2014 7:44 AM  Have discussed with pt and notwithsanding no TEE i think reasonable to undertake LINQ recorder for cryptongenic stroke

## 2014-10-25 NOTE — Progress Notes (Signed)
STROKE TEAM PROGRESS NOTE   HISTORY Debbie Kramer is an 81 y.o. female with a history of hypertension, rheumatoid arthritis and degenerative disc disease brought to the emergency room because of confusion which was first noticed yesterday afternoon by family members. Exact time of onset is unclear (LKW 10/21/2014). Patient has had difficulty with crossword puzzles as well as with performing simple tasks. She's also seemed more forgetful than usual. She had some difficulty with driving yesterday afternoon and last night. CT scan of the head was obtained which showed findings consistent with an acute to subacute right posterior MCA artery ischemic stroke. Patient has no previous history of stroke nor TIA. NIH stroke score was 3. Patient was not administered TPA secondary to Unclear time of onset of deficits; unequivocal recent stroke on CT. She was admitted for further evaluation and treatment.   SUBJECTIVE (INTERVAL HISTORY) Her daughter is at the bedside. Dr. Leonie Man discussed diagnosis, prognosis,  treatment options and plan of care with daughter and pt - including plans for loop later today, awaiting 2D results.     OBJECTIVE Temp:  [97.5 F (36.4 C)-98.1 F (36.7 C)] 97.5 F (36.4 C) (03/24 1414) Pulse Rate:  [61-75] 68 (03/24 1414) Cardiac Rhythm:  [-] Normal sinus rhythm (03/23 2000) Resp:  [15-18] 18 (03/24 1414) BP: (139-166)/(52-61) 166/59 mmHg (03/24 1414) SpO2:  [96 %-100 %] 96 % (03/24 1414)   Recent Labs Lab 10/24/14 1117 10/24/14 1617 10/24/14 2116 10/25/14 0704 10/25/14 1126  GLUCAP 123* 91 91 89 88    Recent Labs Lab 10/23/14 1339 10/23/14 1941 10/24/14 0920 10/25/14 0700  NA 135  --  136 135  K 4.3  --  4.2 4.1  CL 97  --  102 101  CO2 28  --  25 28  GLUCOSE 88  --  73 94  BUN 11  --  12 15  CREATININE 0.75 0.65 0.78 0.74  CALCIUM 9.5  --  8.9 8.6  MG  --   --  2.0  --     Recent Labs Lab 10/23/14 1339  AST 30  ALT 21  ALKPHOS 61  BILITOT 1.0   PROT 7.1  ALBUMIN 3.9    Recent Labs Lab 10/23/14 1339 10/23/14 1941 10/24/14 0920 10/25/14 0700  WBC 7.1 6.7 6.7 6.0  NEUTROABS 4.0  --   --   --   HGB 16.2* 15.7* 15.8* 14.3  HCT 46.8* 45.4 46.9* 43.0  MCV 95.5 93.0 95.1 95.6  PLT 234 212 226 210    Recent Labs Lab 10/23/14 1941 10/24/14 0123 10/24/14 0920  TROPONINI 0.18* 0.14* 0.16*    Recent Labs  10/23/14 1339  LABPROT 13.5  INR 1.01    Recent Labs  10/23/14 1451  COLORURINE YELLOW  LABSPEC 1.007  PHURINE 7.0  GLUCOSEU NEGATIVE  HGBUR NEGATIVE  BILIRUBINUR NEGATIVE  KETONESUR 15*  PROTEINUR NEGATIVE  UROBILINOGEN 0.2  NITRITE NEGATIVE  LEUKOCYTESUR NEGATIVE       Component Value Date/Time   CHOL 153 10/24/2014 0123   TRIG 41 10/24/2014 0123   HDL 68 10/24/2014 0123   CHOLHDL 2.3 10/24/2014 0123   VLDL 8 10/24/2014 0123   LDLCALC 77 10/24/2014 0123   Lab Results  Component Value Date   HGBA1C 5.6 10/24/2014   No results found for: LABOPIA, COCAINSCRNUR, LABBENZ, AMPHETMU, THCU, LABBARB  No results for input(s): ETH in the last 168 hours.  Mri & Mra Brain Wo Contrast 10/23/2014    1. Acute infarcts in  multiple vascular territories suggesting a cardioembolic process. A moderate sized infarct is present in the posterior right MCA territory; the remaining acute infarcts are punctate in size. 2. Chronic small vessel disease and remote small vessel infarcts. 3. No treatable intracranial stenosis or significant atherosclerotic change.     Carotid Doppler  Carotid duplex completed. Bilateral ICA 1-39% stenosis with a moderate amount of heterogenous plaque visualized.   PHYSICAL EXAM Pleasant elderly caucasian lady not in distress. . Afebrile. Head is nontraumatic. Neck is supple without bruit.    Cardiac exam no murmur or gallop. Lungs are clear to auscultation. Distal pulses are well felt. Neurological Exam ;  Awake  Alert oriented x 3. Normal speech and language.eye movements full without  nystagmus.fundi were not visualized. Vision acuity and fields appear normal. Hearing is normal. Palatal movements are normal. Face symmetric. Tongue midline. Normal strength, tone, reflexes and coordination. Normal sensation but subjective paresthesias left hand medial aspect only. Gait deferred.   ASSESSMENT/PLAN [name] is a 81 y.o. female with history of hypertension, rheumatoid arthritis and degenerative disc disease presenting with new onset confusion. She did not receive IV t-PA due to unclear last known well.   Stroke:  bilateral anterior and posterior circulation punctate infarcts, largest in the R PCA, felt to be embolic secondary to unknown source, suspicious for paroxysmal atrial fibrillation   Resultant  Poor short-term memory  MRI  Bilateral anterior and posterior punctate infarcts, R PCA infarct. small vessel disease   MRA  No significant stenosis   Carotid Doppler  No significant stenosis   2D Echo  Done, results pending   TEE - patient refused Patient agreeable to an implantable loop recorder to evaluate for atrial fibrillation as etiology of stroke. EP Cardiology awaiting 2D results prior to placement. Results not currently available. This has been explained to patient/family.  HgbA1c 5.6, at goal  Lovenox 40 mg sq daily for VTE prophylaxis  Diet regular thin liquids  no antithrombotic prior to admission, now on aspirin 325 mg orally every day  Ongoing aggressive stroke risk factor management  Therapy recommendations:  OP OT, no PT needs  Disposition:  Home with OP therapies (pt refused OT)  Ok for discharge from stroke standpoint once 2D resulted and loop placed.  Follow up Dr. Leonie Man in 2 months. Order placed.  Hypertension  stablizing  Hyperlipidemia  Home meds:  No statin  LDL 77, goal < 70  Added low dose statin (zocor 10)  Continue statin at discharge  Other Stroke Risk Factors  Advanced age  Cigarette smoker, advised to stop  smoking  Family hx stroke (father)  Other Active Problems  RA  Minimal troponin elevation  Hospital day # 2  BIBY,SHARON  Sebastian for Pager information 10/25/2014 3:49 PM  I have personally examined this patient, reviewed notes, independently viewed imaging studies, participated in medical decision making and plan of care. I have made any additions or clarifications directly to the above note. Agree with note above.    Antony Contras, MD Medical Director [facility] Pager: [phone] 10/25/2014 6:02 PM    To contact Stroke Continuity provider, please refer to [email or web address] After hours, contact General Neurology

## 2014-10-25 NOTE — Progress Notes (Signed)
Physical Therapy Treatment Patient Details Name: Debbie Kramer MRN: [id number] DOB: 19-Mar-1926 Today's Date: 10/25/2014    History of Present Illness Debbie Kramer is a 81 y.o. female with a Past Medical History of hypertension, rheumatoid arthritis, ongoing tobacco abuse who presents today withconfusion. Per patient, since yesterday she has had disorientation and confusion. She also noted by family to have difficulty driving and seemed more forgetful than usual. She subsequently was brought to the ED today, where a CT scan of the head showed findings consistent with a right posterior MCA ischemic infarct.     PT Comments    Patient progressing very well with mobility and safety. Patient stated that she woke up this morning "and could see normally, I even read the papar!" Patient with no deficits on the L side while ambulating the hallway and practicing steps. Patient safe to D/C from a mobility standpoint based on progression towards goals set on PT eval.    Follow Up Recommendations  No PT follow up     Equipment Recommendations  None recommended by PT    Recommendations for Other Services       Precautions / Restrictions Precautions Precaution Comments: no falls at home recently    Mobility  Bed Mobility Overal bed mobility: Modified Independent                Transfers Overall transfer level: Modified independent                  Ambulation/Gait Ambulation/Gait assistance: Supervision Ambulation Distance (Feet): 300 Feet Assistive device: Straight cane   Gait velocity: WFL   General Gait Details: pt ambulates with bilateral feet toed out, reports she has likely ambulated this way since bilateral TKA. Patient with no incidences of bumping into objects of L side. Patient stated vision was much better today   Stairs   Stairs assistance: Supervision Stair Management: Step to pattern;With cane;One rail Left Number of Stairs: 5 General stair  comments: patient with safe demo of steps  Wheelchair Mobility    Modified Rankin (Stroke Patients Only) Modified Rankin (Stroke Patients Only) Pre-Morbid Rankin Score: No symptoms Modified Rankin: Moderately severe disability     Balance                                    Cognition Arousal/Alertness: Awake/alert Behavior During Therapy: WFL for tasks assessed/performed Overall Cognitive Status: Within Functional Limits for tasks assessed                      Exercises      General Comments        Pertinent Vitals/Pain Pain Assessment: No/denies pain    Home Living                      Prior Function            PT Goals (current goals can now be found in the care plan section) Progress towards PT goals: Progressing toward goals    Frequency  Min 4X/week    PT Plan Current plan remains appropriate    Co-evaluation             End of Session Equipment Utilized During Treatment: Gait belt Activity Tolerance: Patient tolerated treatment well Patient left: in chair;with chair alarm set;with call bell/phone within reach     Time: 0350-0938 PT Time Calculation (  min) (ACUTE ONLY): 12 min  Charges:  $Gait Training: 8-22 mins                    G Codes:      Jacqualyn Posey 10/25/2014, 11:01 AM  10/25/2014 Jacqualyn Posey PTA [phone] pager [phone] office

## 2014-10-25 NOTE — Progress Notes (Signed)
Patient arrived to room back from loop recorder insertion. OK to be discharge by Dr. Jens Som. Discharge instructions reviewed with patient/family. All questions answered at this time. RX given. Transport by family.   Ave Filter, RN

## 2014-10-25 NOTE — Progress Notes (Signed)
Loop recorder information explained to patient/family. Daughter Jolayne Haines) at bedside want to sign consent with MD present. She has many questions prior to procedure. Consent left in chart.   Ave Filter, RN

## 2014-10-25 NOTE — Progress Notes (Signed)
CM talked to patient with daughter present about Occupational Therapist recommendation for Outpatient OT; patient refused; CM informed patient that if she changed her mind after discharge and wanted Outpatient therapy her PCP can make the arrangements; Aneta Mins [phone]

## 2014-10-26 ENCOUNTER — Encounter (HOSPITAL_COMMUNITY): Payer: Self-pay | Admitting: Internal Medicine

## 2014-11-05 ENCOUNTER — Ambulatory Visit: Payer: Medicare Other

## 2014-11-09 ENCOUNTER — Ambulatory Visit (INDEPENDENT_AMBULATORY_CARE_PROVIDER_SITE_OTHER): Payer: Medicare Other | Admitting: *Deleted

## 2014-11-09 DIAGNOSIS — I639 Cerebral infarction, unspecified: Secondary | ICD-10-CM

## 2014-11-09 LAB — MDC_IDC_ENUM_SESS_TYPE_INCLINIC
Date Time Interrogation Session: 20160408152048
Zone Setting Detection Interval: 2000 ms
Zone Setting Detection Interval: 3000 ms
Zone Setting Detection Interval: 420 ms

## 2014-11-09 NOTE — Progress Notes (Signed)
Wound check in clinic s/p ILR implant. Wound well healed without redness or edema. Pt with 0 tachy episodes; 0 brady episodes; 0 asystole; 0 AF episodes; 0 symptom episodes recorded. Plan to follow up via [facility] and with SK in 12 months.

## 2014-11-13 DIAGNOSIS — E785 Hyperlipidemia, unspecified: Secondary | ICD-10-CM | POA: Diagnosis not present

## 2014-11-13 DIAGNOSIS — M069 Rheumatoid arthritis, unspecified: Secondary | ICD-10-CM | POA: Diagnosis not present

## 2014-11-13 DIAGNOSIS — Z6820 Body mass index (BMI) 20.0-20.9, adult: Secondary | ICD-10-CM | POA: Diagnosis not present

## 2014-11-13 DIAGNOSIS — M0589 Other rheumatoid arthritis with rheumatoid factor of multiple sites: Secondary | ICD-10-CM | POA: Diagnosis not present

## 2014-11-13 DIAGNOSIS — I10 Essential (primary) hypertension: Secondary | ICD-10-CM | POA: Diagnosis not present

## 2014-11-13 DIAGNOSIS — F17200 Nicotine dependence, unspecified, uncomplicated: Secondary | ICD-10-CM | POA: Diagnosis not present

## 2014-11-13 DIAGNOSIS — I63411 Cerebral infarction due to embolism of right middle cerebral artery: Secondary | ICD-10-CM | POA: Diagnosis not present

## 2014-11-23 ENCOUNTER — Encounter: Payer: Medicare Other | Admitting: *Deleted

## 2014-11-23 ENCOUNTER — Ambulatory Visit (INDEPENDENT_AMBULATORY_CARE_PROVIDER_SITE_OTHER): Payer: Medicare Other | Admitting: *Deleted

## 2014-11-23 DIAGNOSIS — I639 Cerebral infarction, unspecified: Secondary | ICD-10-CM

## 2014-11-28 NOTE — Progress Notes (Signed)
Loop recorder 

## 2014-12-03 ENCOUNTER — Encounter: Payer: Self-pay | Admitting: Internal Medicine

## 2014-12-24 ENCOUNTER — Ambulatory Visit (INDEPENDENT_AMBULATORY_CARE_PROVIDER_SITE_OTHER): Payer: Medicare Other | Admitting: *Deleted

## 2014-12-24 DIAGNOSIS — I639 Cerebral infarction, unspecified: Secondary | ICD-10-CM | POA: Diagnosis not present

## 2014-12-27 LAB — CUP PACEART REMOTE DEVICE CHECK
Date Time Interrogation Session: 20160526101100
MDC IDC SET ZONE DETECTION INTERVAL: 3000 ms
Zone Setting Detection Interval: 2000 ms
Zone Setting Detection Interval: 420 ms

## 2014-12-28 NOTE — Progress Notes (Signed)
Loop recorder 

## 2015-01-04 LAB — CUP PACEART REMOTE DEVICE CHECK: MDC IDC SESS DTM: 20160603131540

## 2015-01-09 DIAGNOSIS — M0589 Other rheumatoid arthritis with rheumatoid factor of multiple sites: Secondary | ICD-10-CM | POA: Diagnosis not present

## 2015-01-10 ENCOUNTER — Ambulatory Visit (INDEPENDENT_AMBULATORY_CARE_PROVIDER_SITE_OTHER): Payer: Medicare Other | Admitting: Neurology

## 2015-01-10 ENCOUNTER — Encounter: Payer: Self-pay | Admitting: Neurology

## 2015-01-10 VITALS — Ht 61.0 in | Wt 144.8 lb

## 2015-01-10 DIAGNOSIS — I639 Cerebral infarction, unspecified: Secondary | ICD-10-CM | POA: Diagnosis not present

## 2015-01-10 DIAGNOSIS — I6340 Cerebral infarction due to embolism of unspecified cerebral artery: Secondary | ICD-10-CM | POA: Diagnosis not present

## 2015-01-10 NOTE — Progress Notes (Signed)
[facility] [address] [place]. [place] [address] [phone]       OFFICE FOLLOW-UP NOTE  [name] Date of Birth:  1925/08/10 Medical Record Number:  [id number]   HPI: 83 year pleasant elderly Caucasian lady seen for first office follow-up visit following hospital admission for stroke on 10/21/14. She presented with new onset of confusion, difficulty with crossword puzzles, performing simple task and being more forgetful. She got lost while driving but was able to eventually reach home. CT scan of the head showed a subacute right posterior MCA territory ischemic stroke but MRI scan showed multiple acute infarcts in the anterior and posterior circulation with the largest one being a moderate size infarct in the right posterior MCA territory. Carotid ultrasound showed no extracranial stenosis. Telemetry monitoring did not reveal cardiac arrhythmia. Hemogram 11 she was 5.6. Total cholesterol 153, triglycerides 41, HDL 68 and LDL 77 mg percent. She had loop recorder inserted. Patient refused TEE and transthoracic echo was unremarkable. She was started on aspirin and states is tolerating it well. She's had minor bruising but no bleeding. She is still has peripheral vision loss on the left but her confusion seems to have improved. She is independent in activities of daily living and lives at home with her daughter. She has been using a cane for ambulation for several years due to arthritis.  ROS:   14 system review of systems is positive for  runny nose, blurred vision, cough, excessive thirst, constipation, allergies, frequency of urination, joint pain, back pain, walking difficulty, daytime sleepiness, skin moles, agitation and all other systems negative  PMH:  Past Medical History  Diagnosis Date  . Rheumatoid arthritis(714.0)   . Hypertension   . Degenerative disc disease   . Environmental allergies     Social History:  History   Social History  . Marital  Status: Widowed    Spouse Name: N/A  . Number of Children: 5  . Years of Education: 10   Occupational History  . florist     retired   Social History Main Topics  . Smoking status: Current Every Day Smoker -- 0.50 packs/day for 50 years    Types: Cigarettes  . Smokeless tobacco: Not on file     Comment: 01/10/15 weaning off, smokes 5 cigs daily  . Alcohol Use: No  . Drug Use: No  . Sexual Activity: Not on file   Other Topics Concern  . Not on file   Social History Narrative   Lives with daughter, Debbie Kramer; widowed   Right handed   Caffeine use - none    Medications:   Current Outpatient Prescriptions on File Prior to Visit  Medication Sig Dispense Refill  . alendronate (FOSAMAX) 70 MG tablet Take 70 mg by mouth once a week. Take with a full glass of water on an empty stomach.    Marland Kitchen aspirin 325 MG tablet Take 1 tablet (325 mg total) by mouth daily.    . benazepril (LOTENSIN) 20 MG tablet Take 20 mg by mouth daily.      . Calcium Citrate-Vitamin D (CITRACAL PETITES/VITAMIN D PO) Take by mouth.      . folic acid (FOLVITE) 1 MG tablet Take 1 mg by mouth daily.    . InFLIXimab (REMICADE IV) Inject 200 mg into the vein every 8 (eight) weeks.     . meloxicam (MOBIC) 15 MG tablet Take 15 mg by mouth daily.    . methotrexate 2.5 MG tablet Take 2.5  mg by mouth once a week.    . Multiple Vitamin (MULTIVITAMIN PO) Take 1 capsule by mouth daily.     . simvastatin (ZOCOR) 10 MG tablet Take 1 tablet (10 mg total) by mouth daily at 6 PM. 30 tablet 0   No current facility-administered medications on file prior to visit.    Allergies:  No Known Allergies  Physical Exam General: Frail elderly Caucasian lady seated, in no evident distress Head: head normocephalic and atraumatic.  Neck: supple with no carotid or supraclavicular bruits Cardiovascular: regular rate and rhythm, no murmurs Musculoskeletal: no deformity Skin:  no rash/petichiae Vascular:  Normal pulses all extremities There were  no vitals filed for this visit. Neurologic Exam Mental Status: Awake and fully alert. Oriented to place and time. Recent and remote memory intact. Attention span, concentration and fund of knowledge appropriate. Mood and affect appropriate. Recall 3/3. Animal naming test 10. Cranial Nerves: Fundoscopic exam reveals sharp disc margins. Pupils equal, briskly reactive to light. Extraocular movements full without nystagmus. Visual fields show left inferior quadrantanopsia to confrontation testing. Hearing intact. Facial sensation intact. Face, tongue, palate moves normally and symmetrically.  Motor: Normal bulk and tone. Normal strength in all tested extremity muscles. Sensory.: intact to touch ,pinprick .position and vibratory sensation.  Coordination: Rapid alternating movements normal in all extremities. Finger-to-nose and heel-to-shin performed accurately bilaterally. Gait and Station: Arises from chair without difficulty. Stance is normal. Gait demonstrates normal stride length and balance . Able to heel, toe and tandem walk with mild difficulty.  Reflexes: 1+ and symmetric. Toes downgoing.   NIHSS 1 Modified Rankin  1   ASSESSMENT: 67 year with acute embolic infarcts in multiple vascular territories in March 4818 likely of embolic etiology without definite identified source.     PLAN: I had a long d/w patient and daughter about her recent stroke, risk for recurrent stroke/TIAs, personally independently reviewed imaging studies and stroke evaluation results and answered questions.Continue aspirin 325 mg orally every day  for secondary stroke prevention and maintain strict control of hypertension with blood pressure goal below 130/90, diabetes with hemoglobin A1c goal below 6.5% and lipids with LDL cholesterol goal below 100 mg/dL. I also advised the patient to eat a healthy diet with plenty of whole grains, cereals, fruits and vegetables, exercise regularly and maintain ideal body weight. She  was advised not to drive due to her visual field deficit. She was also given information to review at home about the RESPECT ESUS stroke trial and instructed to call back if interested .Followup in the future with me in  6 months or call earlier if necessary Antony Contras, MD   Note: This document was prepared with digital dictation and possible smart phrase technology. Any transcriptional errors that result from this process are unintentional

## 2015-01-10 NOTE — Patient Instructions (Signed)
I had a long d/w patient and daughter about her recent stroke, risk for recurrent stroke/TIAs, personally independently reviewed imaging studies and stroke evaluation results and answered questions.Continue aspirin 325 mg orally every day  for secondary stroke prevention and maintain strict control of hypertension with blood pressure goal below 130/90, diabetes with hemoglobin A1c goal below 6.5% and lipids with LDL cholesterol goal below 100 mg/dL. I also advised the patient to eat a healthy diet with plenty of whole grains, cereals, fruits and vegetables, exercise regularly and maintain ideal body weight. She was also given information to review at home about the RESPECT ESUS stroke trial and instructed to call back if interested .Followup in the future with me in  6 months or call earlier if necessary  Stroke Prevention Some medical conditions and behaviors are associated with an increased chance of having a stroke. You may prevent a stroke by making healthy choices and managing medical conditions. HOW CAN I REDUCE MY RISK OF HAVING A STROKE?   Stay physically active. Get at least 30 minutes of activity on most or all days.  Do not smoke. It may also be helpful to avoid exposure to secondhand smoke.  Limit alcohol use. Moderate alcohol use is considered to be:  No more than 2 drinks per day for men.  No more than 1 drink per day for nonpregnant women.  Eat healthy foods. This involves:  Eating 5 or more servings of fruits and vegetables a day.  Making dietary changes that address high blood pressure (hypertension), high cholesterol, diabetes, or obesity.  Manage your cholesterol levels.  Making food choices that are high in fiber and low in saturated fat, trans fat, and cholesterol may control cholesterol levels.  Take any prescribed medicines to control cholesterol as directed by your health care provider.  Manage your diabetes.  Controlling your carbohydrate and sugar intake is  recommended to manage diabetes.  Take any prescribed medicines to control diabetes as directed by your health care provider.  Control your hypertension.  Making food choices that are low in salt (sodium), saturated fat, trans fat, and cholesterol is recommended to manage hypertension.  Take any prescribed medicines to control hypertension as directed by your health care provider.  Maintain a healthy weight.  Reducing calorie intake and making food choices that are low in sodium, saturated fat, trans fat, and cholesterol are recommended to manage weight.  Stop drug abuse.  Avoid taking birth control pills.  Talk to your health care provider about the risks of taking birth control pills if you are over 60 years old, smoke, get migraines, or have ever had a blood clot.  Get evaluated for sleep disorders (sleep apnea).  Talk to your health care provider about getting a sleep evaluation if you snore a lot or have excessive sleepiness.  Take medicines only as directed by your health care provider.  For some people, aspirin or blood thinners (anticoagulants) are helpful in reducing the risk of forming abnormal blood clots that can lead to stroke. If you have the irregular heart rhythm of atrial fibrillation, you should be on a blood thinner unless there is a good reason you cannot take them.  Understand all your medicine instructions.  Make sure that other conditions (such as anemia or atherosclerosis) are addressed. SEEK IMMEDIATE MEDICAL CARE IF:   You have sudden weakness or numbness of the face, arm, or leg, especially on one side of the body.  Your face or eyelid droops to one side.  You have sudden confusion.  You have trouble speaking (aphasia) or understanding.  You have sudden trouble seeing in one or both eyes.  You have sudden trouble walking.  You have dizziness.  You have a loss of balance or coordination.  You have a sudden, severe headache with no known  cause.  You have new chest pain or an irregular heartbeat. Any of these symptoms may represent a serious problem that is an emergency. Do not wait to see if the symptoms will go away. Get medical help at once. Call your local emergency services (911 in [place] Do not drive yourself to the hospital. Document Released: 08/27/2004 Document Revised: 12/04/2013 Document Reviewed: 01/20/2013 [facility] Patient Information 2015 [place], [place]. This information is not intended to replace advice given to you by your health care provider. Make sure you discuss any questions you have with your health care provider.

## 2015-01-23 ENCOUNTER — Ambulatory Visit (INDEPENDENT_AMBULATORY_CARE_PROVIDER_SITE_OTHER): Payer: Medicare Other | Admitting: *Deleted

## 2015-01-23 ENCOUNTER — Encounter: Payer: Self-pay | Admitting: Internal Medicine

## 2015-01-23 DIAGNOSIS — I639 Cerebral infarction, unspecified: Secondary | ICD-10-CM

## 2015-01-24 NOTE — Progress Notes (Signed)
Loop recorder 

## 2015-01-29 LAB — CUP PACEART REMOTE DEVICE CHECK: MDC IDC SESS DTM: 20160628110433

## 2015-02-07 DIAGNOSIS — M545 Low back pain: Secondary | ICD-10-CM | POA: Diagnosis not present

## 2015-02-07 DIAGNOSIS — M810 Age-related osteoporosis without current pathological fracture: Secondary | ICD-10-CM | POA: Diagnosis not present

## 2015-02-07 DIAGNOSIS — M0589 Other rheumatoid arthritis with rheumatoid factor of multiple sites: Secondary | ICD-10-CM | POA: Diagnosis not present

## 2015-02-07 DIAGNOSIS — M150 Primary generalized (osteo)arthritis: Secondary | ICD-10-CM | POA: Diagnosis not present

## 2015-02-19 ENCOUNTER — Encounter: Payer: Self-pay | Admitting: Internal Medicine

## 2015-02-22 ENCOUNTER — Ambulatory Visit (INDEPENDENT_AMBULATORY_CARE_PROVIDER_SITE_OTHER): Payer: Medicare Other | Admitting: *Deleted

## 2015-02-22 DIAGNOSIS — I639 Cerebral infarction, unspecified: Secondary | ICD-10-CM

## 2015-02-25 ENCOUNTER — Encounter: Payer: Self-pay | Admitting: Internal Medicine

## 2015-03-04 DIAGNOSIS — M0589 Other rheumatoid arthritis with rheumatoid factor of multiple sites: Secondary | ICD-10-CM | POA: Diagnosis not present

## 2015-03-12 NOTE — Progress Notes (Signed)
Loop recorder 

## 2015-03-18 LAB — CUP PACEART REMOTE DEVICE CHECK: MDC IDC SESS DTM: 20160815075131

## 2015-03-25 ENCOUNTER — Ambulatory Visit (INDEPENDENT_AMBULATORY_CARE_PROVIDER_SITE_OTHER): Payer: Medicare Other | Admitting: *Deleted

## 2015-03-25 DIAGNOSIS — I639 Cerebral infarction, unspecified: Secondary | ICD-10-CM

## 2015-03-27 NOTE — Progress Notes (Signed)
Loop recorder 

## 2015-04-01 LAB — CUP PACEART REMOTE DEVICE CHECK: Date Time Interrogation Session: 20160829085430

## 2015-04-23 ENCOUNTER — Ambulatory Visit (INDEPENDENT_AMBULATORY_CARE_PROVIDER_SITE_OTHER): Payer: Medicare Other | Admitting: *Deleted

## 2015-04-23 DIAGNOSIS — I639 Cerebral infarction, unspecified: Secondary | ICD-10-CM | POA: Diagnosis not present

## 2015-04-24 ENCOUNTER — Encounter: Payer: Self-pay | Admitting: Internal Medicine

## 2015-04-24 NOTE — Progress Notes (Signed)
Loop recorder 

## 2015-04-25 DIAGNOSIS — M810 Age-related osteoporosis without current pathological fracture: Secondary | ICD-10-CM | POA: Diagnosis not present

## 2015-04-25 DIAGNOSIS — R7301 Impaired fasting glucose: Secondary | ICD-10-CM | POA: Diagnosis not present

## 2015-04-25 DIAGNOSIS — E785 Hyperlipidemia, unspecified: Secondary | ICD-10-CM | POA: Diagnosis not present

## 2015-04-25 DIAGNOSIS — I10 Essential (primary) hypertension: Secondary | ICD-10-CM | POA: Diagnosis not present

## 2015-04-27 LAB — CUP PACEART REMOTE DEVICE CHECK: MDC IDC SESS DTM: 20160920210628

## 2015-04-27 NOTE — Progress Notes (Signed)
Carelink summary report received. Battery status OK. Normal device function. No new symptom episodes, tachy episodes, brady, or pause episodes. No new AF episodes. Monthly summary reports and ROV with SK in 11/2015.

## 2015-04-29 DIAGNOSIS — M0589 Other rheumatoid arthritis with rheumatoid factor of multiple sites: Secondary | ICD-10-CM | POA: Diagnosis not present

## 2015-04-29 DIAGNOSIS — R5383 Other fatigue: Secondary | ICD-10-CM | POA: Diagnosis not present

## 2015-04-29 DIAGNOSIS — Z79899 Other long term (current) drug therapy: Secondary | ICD-10-CM | POA: Diagnosis not present

## 2015-05-02 DIAGNOSIS — M150 Primary generalized (osteo)arthritis: Secondary | ICD-10-CM | POA: Diagnosis not present

## 2015-05-02 DIAGNOSIS — K5909 Other constipation: Secondary | ICD-10-CM | POA: Diagnosis not present

## 2015-05-02 DIAGNOSIS — Z23 Encounter for immunization: Secondary | ICD-10-CM | POA: Diagnosis not present

## 2015-05-02 DIAGNOSIS — M810 Age-related osteoporosis without current pathological fracture: Secondary | ICD-10-CM | POA: Diagnosis not present

## 2015-05-02 DIAGNOSIS — Z1389 Encounter for screening for other disorder: Secondary | ICD-10-CM | POA: Diagnosis not present

## 2015-05-02 DIAGNOSIS — G458 Other transient cerebral ischemic attacks and related syndromes: Secondary | ICD-10-CM | POA: Diagnosis not present

## 2015-05-02 DIAGNOSIS — M5136 Other intervertebral disc degeneration, lumbar region: Secondary | ICD-10-CM | POA: Diagnosis not present

## 2015-05-02 DIAGNOSIS — Z0000 Encounter for general adult medical examination without abnormal findings: Secondary | ICD-10-CM | POA: Diagnosis not present

## 2015-05-02 DIAGNOSIS — R7301 Impaired fasting glucose: Secondary | ICD-10-CM | POA: Diagnosis not present

## 2015-05-02 DIAGNOSIS — F17200 Nicotine dependence, unspecified, uncomplicated: Secondary | ICD-10-CM | POA: Diagnosis not present

## 2015-05-02 DIAGNOSIS — N182 Chronic kidney disease, stage 2 (mild): Secondary | ICD-10-CM | POA: Diagnosis not present

## 2015-05-02 DIAGNOSIS — G8929 Other chronic pain: Secondary | ICD-10-CM | POA: Diagnosis not present

## 2015-05-02 DIAGNOSIS — I63411 Cerebral infarction due to embolism of right middle cerebral artery: Secondary | ICD-10-CM | POA: Diagnosis not present

## 2015-05-02 DIAGNOSIS — I129 Hypertensive chronic kidney disease with stage 1 through stage 4 chronic kidney disease, or unspecified chronic kidney disease: Secondary | ICD-10-CM | POA: Diagnosis not present

## 2015-05-02 DIAGNOSIS — M0589 Other rheumatoid arthritis with rheumatoid factor of multiple sites: Secondary | ICD-10-CM | POA: Diagnosis not present

## 2015-05-13 ENCOUNTER — Encounter: Payer: Self-pay | Admitting: Internal Medicine

## 2015-05-14 ENCOUNTER — Encounter: Payer: Self-pay | Admitting: Internal Medicine

## 2015-05-23 ENCOUNTER — Ambulatory Visit (INDEPENDENT_AMBULATORY_CARE_PROVIDER_SITE_OTHER): Payer: Medicare Other | Admitting: *Deleted

## 2015-05-23 DIAGNOSIS — I638 Other cerebral infarction: Secondary | ICD-10-CM

## 2015-05-23 DIAGNOSIS — I6389 Other cerebral infarction: Secondary | ICD-10-CM

## 2015-05-27 NOTE — Progress Notes (Signed)
Loop recorder 

## 2015-06-11 LAB — CUP PACEART REMOTE DEVICE CHECK: Date Time Interrogation Session: 20161020213520

## 2015-06-11 NOTE — Progress Notes (Signed)
Carelink summary report received. Battery status OK. Normal device function. No new symptom episodes, tachy episodes, brady, or pause episodes. No new AF episodes. Monthly summary reports and ROV with SK in 11/2015.

## 2015-06-12 DIAGNOSIS — Z6828 Body mass index (BMI) 28.0-28.9, adult: Secondary | ICD-10-CM | POA: Diagnosis not present

## 2015-06-12 DIAGNOSIS — R05 Cough: Secondary | ICD-10-CM | POA: Diagnosis not present

## 2015-06-12 DIAGNOSIS — J209 Acute bronchitis, unspecified: Secondary | ICD-10-CM | POA: Diagnosis not present

## 2015-06-12 DIAGNOSIS — F17200 Nicotine dependence, unspecified, uncomplicated: Secondary | ICD-10-CM | POA: Diagnosis not present

## 2015-06-24 ENCOUNTER — Ambulatory Visit (INDEPENDENT_AMBULATORY_CARE_PROVIDER_SITE_OTHER): Payer: Medicare Other | Admitting: *Deleted

## 2015-06-24 DIAGNOSIS — I639 Cerebral infarction, unspecified: Secondary | ICD-10-CM

## 2015-06-25 NOTE — Progress Notes (Signed)
LOOP RECORDER  

## 2015-06-26 DIAGNOSIS — M0589 Other rheumatoid arthritis with rheumatoid factor of multiple sites: Secondary | ICD-10-CM | POA: Diagnosis not present

## 2015-07-01 DIAGNOSIS — J209 Acute bronchitis, unspecified: Secondary | ICD-10-CM | POA: Diagnosis not present

## 2015-07-01 DIAGNOSIS — R0602 Shortness of breath: Secondary | ICD-10-CM | POA: Diagnosis not present

## 2015-07-01 DIAGNOSIS — F17200 Nicotine dependence, unspecified, uncomplicated: Secondary | ICD-10-CM | POA: Diagnosis not present

## 2015-07-01 DIAGNOSIS — I129 Hypertensive chronic kidney disease with stage 1 through stage 4 chronic kidney disease, or unspecified chronic kidney disease: Secondary | ICD-10-CM | POA: Diagnosis not present

## 2015-07-01 DIAGNOSIS — R05 Cough: Secondary | ICD-10-CM | POA: Diagnosis not present

## 2015-07-01 DIAGNOSIS — M069 Rheumatoid arthritis, unspecified: Secondary | ICD-10-CM | POA: Diagnosis not present

## 2015-07-01 DIAGNOSIS — Z6824 Body mass index (BMI) 24.0-24.9, adult: Secondary | ICD-10-CM | POA: Diagnosis not present

## 2015-07-07 ENCOUNTER — Encounter (HOSPITAL_COMMUNITY): Payer: Self-pay | Admitting: Emergency Medicine

## 2015-07-07 ENCOUNTER — Emergency Department (HOSPITAL_COMMUNITY)
Admission: EM | Admit: 2015-07-07 | Discharge: 2015-07-07 | Disposition: A | Discharge status: Home / Self Care | Payer: Medicare Other | Attending: Emergency Medicine | Admitting: Emergency Medicine

## 2015-07-07 ENCOUNTER — Emergency Department (HOSPITAL_COMMUNITY): Payer: Medicare Other

## 2015-07-07 DIAGNOSIS — J40 Bronchitis, not specified as acute or chronic: Secondary | ICD-10-CM

## 2015-07-07 DIAGNOSIS — J441 Chronic obstructive pulmonary disease with (acute) exacerbation: Secondary | ICD-10-CM

## 2015-07-07 DIAGNOSIS — M069 Rheumatoid arthritis, unspecified: Secondary | ICD-10-CM | POA: Diagnosis not present

## 2015-07-07 DIAGNOSIS — Z7982 Long term (current) use of aspirin: Secondary | ICD-10-CM | POA: Diagnosis not present

## 2015-07-07 DIAGNOSIS — J440 Chronic obstructive pulmonary disease with acute lower respiratory infection: Secondary | ICD-10-CM | POA: Insufficient documentation

## 2015-07-07 DIAGNOSIS — R0602 Shortness of breath: Secondary | ICD-10-CM | POA: Diagnosis present

## 2015-07-07 DIAGNOSIS — I10 Essential (primary) hypertension: Secondary | ICD-10-CM | POA: Diagnosis not present

## 2015-07-07 DIAGNOSIS — F17210 Nicotine dependence, cigarettes, uncomplicated: Secondary | ICD-10-CM | POA: Insufficient documentation

## 2015-07-07 DIAGNOSIS — Z79899 Other long term (current) drug therapy: Secondary | ICD-10-CM | POA: Insufficient documentation

## 2015-07-07 DIAGNOSIS — Z791 Long term (current) use of non-steroidal anti-inflammatories (NSAID): Secondary | ICD-10-CM | POA: Diagnosis not present

## 2015-07-07 DIAGNOSIS — R0600 Dyspnea, unspecified: Secondary | ICD-10-CM | POA: Diagnosis not present

## 2015-07-07 LAB — BASIC METABOLIC PANEL
Anion gap: 10 (ref 5–15)
BUN: 17 mg/dL (ref 6–20)
CHLORIDE: 100 mmol/L — AB (ref 101–111)
CO2: 24 mmol/L (ref 22–32)
Calcium: 8.9 mg/dL (ref 8.9–10.3)
Creatinine, Ser: 0.83 mg/dL (ref 0.44–1.00)
GFR calc Af Amer: >60 mL/min (ref >60)
GFR calc non Af Amer: >60 mL/min (ref >60)
GLUCOSE: 139 mg/dL — AB (ref 65–99)
POTASSIUM: 3.6 mmol/L (ref 3.5–5.1)
Sodium: 134 mmol/L — ABNORMAL LOW (ref 135–145)

## 2015-07-07 LAB — CBC WITH DIFFERENTIAL/PLATELET
Basophils Absolute: 0 10*3/uL (ref 0.0–0.1)
Basophils Relative: 0 %
EOS PCT: 0 %
Eosinophils Absolute: 0 10*3/uL (ref 0.0–0.7)
HEMATOCRIT: 47.2 % — AB (ref 36.0–46.0)
HEMOGLOBIN: 16.1 g/dL — AB (ref 12.0–15.0)
LYMPHS ABS: 1.8 10*3/uL (ref 0.7–4.0)
LYMPHS PCT: 9 %
MCH: 31.7 pg (ref 26.0–34.0)
MCHC: 34.1 g/dL (ref 30.0–36.0)
MCV: 92.9 fL (ref 78.0–100.0)
Monocytes Absolute: 2 10*3/uL — ABNORMAL HIGH (ref 0.1–1.0)
Monocytes Relative: 10 %
NEUTROS ABS: 16.2 10*3/uL — AB (ref 1.7–7.7)
NEUTROS PCT: 81 %
Platelets: 273 10*3/uL (ref 150–400)
RBC: 5.08 MIL/uL (ref 3.87–5.11)
RDW: 15.3 % (ref 11.5–15.5)
WBC: 20.1 10*3/uL — AB (ref 4.0–10.5)

## 2015-07-07 MED ORDER — LEVOFLOXACIN 500 MG PO TABS: 500.0000 mg | ORAL_TABLET | Freq: Every day | ORAL | Status: DC

## 2015-07-07 MED ORDER — ALBUTEROL SULFATE (2.5 MG/3ML) 0.083% IN NEBU
5.0000 mg | INHALATION_SOLUTION | Freq: Once | RESPIRATORY_TRACT | Status: AC
  Administered 2015-07-07: 5 mg via RESPIRATORY_TRACT
  Filled 2015-07-07: qty 6

## 2015-07-07 MED ORDER — ALBUTEROL SULFATE HFA 108 (90 BASE) MCG/ACT IN AERS: 2.0000 | INHALATION_SPRAY | RESPIRATORY_TRACT | Status: DC | PRN

## 2015-07-07 MED ORDER — LEVOFLOXACIN 500 MG PO TABS
500.0000 mg | ORAL_TABLET | Freq: Once | ORAL | Status: AC
  Administered 2015-07-07: 500 mg via ORAL
  Filled 2015-07-07: qty 1

## 2015-07-07 NOTE — ED Notes (Signed)
Pt. reports SOB/chest congestion  for 5 weeks seen by her PCP prescribed with oral antibiotic , tapering steroid and inhaler with no relief ,  Fever this evening took Tylenol prior to arrival .

## 2015-07-07 NOTE — ED Provider Notes (Signed)
CSN: [id number]     Arrival date & time 07/07/15  0051 History  By signing my name below, I, Jolayne Panther, attest that this documentation has been prepared under the direction and in the presence of Jola Schmidt, MD. Electronically Signed: Jolayne Panther, Scribe. 07/07/2015. 3:15 AM.    Chief Complaint  Patient presents with  . Shortness of Breath  . Fever    The history is provided by the patient. No language interpreter was used.    HPI Comments: SKYLAN GIFT is a 81 y.o. female who presents to the Emergency Department complaining of hoarseness and mucous for the past three weeks with onset of a fever of 101.7, chills, diaphoresis, and SOB seven hours ago. Pt's daughter reports the pt was prescribed antibiotics (5 days of prednisone) three weeks ago to treat the hoarseness with no relief. Pt was then prescribed prednisone, which she is currently taking, and symbicort by another physician with no relief. Pt notes she has used her inhaler today as well with no relief. She has also taken Tylenol today. Pt denies cough. Pt has an extensive h/o smoking. She reports mild relief with breathing treament administered in the ER.   Past Medical History  Diagnosis Date  . Rheumatoid arthritis(714.0)   . Hypertension   . Degenerative disc disease   . Environmental allergies    Past Surgical History  Procedure Laterality Date  . Hernia repair      umbilical  . Knee surgery Bilateral 1996, 1997    replacements  . Cholecystectomy      lap choley  . Loop recorder implant N/A 10/25/2014    Procedure: LOOP RECORDER IMPLANT;  Surgeon: Deboraha Sprang, MD;  [facility] CATH LAB;  Service: Cardiovascular;  Laterality: N/A;   Family History  Problem Relation Age of Onset  . Stroke Father   . Cancer Father     lung  . Cancer Mother   . Diabetes Brother   . Stroke Maternal Grandmother    Social History  Substance Use Topics  . Smoking status: Current Every Day Smoker -- 0.00  packs/day for 50 years    Types: Cigarettes  . Smokeless tobacco: None     Comment: 01/10/15 weaning off, smokes 5 cigs daily  . Alcohol Use: No   OB History    No data available     Review of Systems A complete 10 system review of systems was obtained and all systems are negative except as noted in the HPI and PMH.   Allergies  Review of patient's allergies indicates no known allergies.  Home Medications   Prior to Admission medications   Medication Sig Start Date End Date Taking? Authorizing Provider  alendronate (FOSAMAX) 70 MG tablet Take 70 mg by mouth once a week. Take with a full glass of water on an empty stomach.    Historical Provider, MD  aspirin 325 MG tablet Take 1 tablet (325 mg total) by mouth daily. 10/25/14   Eugenie Filler, MD  benazepril (LOTENSIN) 20 MG tablet Take 20 mg by mouth daily.      Historical Provider, MD  Calcium Citrate-Vitamin D (CITRACAL PETITES/VITAMIN D PO) Take by mouth.      Historical Provider, MD  folic acid (FOLVITE) 1 MG tablet Take 1 mg by mouth daily.    Historical Provider, MD  HYDROcodone-acetaminophen (NORCO/VICODIN) 5-325 MG per tablet  11/24/14   Historical Provider, MD  InFLIXimab (REMICADE IV) Inject 200 mg into the vein every  8 (eight) weeks.     Historical Provider, MD  meloxicam (MOBIC) 15 MG tablet Take 15 mg by mouth daily.    Historical Provider, MD  methotrexate 2.5 MG tablet Take 2.5 mg by mouth once a week.    Historical Provider, MD  Multiple Vitamin (MULTIVITAMIN PO) Take 1 capsule by mouth daily.     Historical Provider, MD  simvastatin (ZOCOR) 10 MG tablet Take 1 tablet (10 mg total) by mouth daily at 6 PM. 10/25/14   Eugenie Filler, MD   BP 114/75 mmHg  Pulse 109  Temp(Src) 98.1 F (36.7 C) (Oral)  Resp 20  Ht 5' (1.524 m)  Wt 138 lb 4 oz (62.71 kg)  BMI 27.00 kg/m2  SpO2 96%  LMP  (LMP Unknown) Physical Exam  Constitutional: She is oriented to person, place, and time. She appears well-developed and  well-nourished. No distress.  Cervical lymphadenopathy left neck Trachea midline Posterior pharynx is normal    HENT:  Head: Normocephalic and atraumatic.  Eyes: EOM are normal.  Neck: Normal range of motion.  Cardiovascular: Normal rate, regular rhythm and normal heart sounds.   Pulmonary/Chest: Effort normal and breath sounds normal.  No wheezing Mild rhonchi bilaterally.   Abdominal: Soft. She exhibits no distension. There is no tenderness.  Musculoskeletal: Normal range of motion.  Neurological: She is alert and oriented to person, place, and time.  Skin: Skin is warm and dry.  Psychiatric: She has a normal mood and affect. Judgment normal.  Nursing note and vitals reviewed.   ED Course  Procedures  DIAGNOSTIC STUDIES:    Oxygen Saturation is 96% on RA, adequate by my interpretation.   COORDINATION OF CARE:  2:17 AM Reviewed imaging and lab work. Will administer levofloxacin and albuterol in the ED. Discussed treatment plan with pt at bedside and pt agreed to plan.   Labs Review Labs Reviewed  CBC WITH DIFFERENTIAL/PLATELET - Abnormal; Notable for the following:    WBC 20.1 (*)    Hemoglobin 16.1 (*)    HCT 47.2 (*)    Neutro Abs 16.2 (*)    Monocytes Absolute 2.0 (*)    All other components within normal limits  BASIC METABOLIC PANEL - Abnormal; Notable for the following:    Sodium 134 (*)    Chloride 100 (*)    Glucose, Bld 139 (*)    All other components within normal limits    Imaging Review Dg Chest 2 View  07/07/2015  CLINICAL DATA:  Dyspnea, onset today. EXAM: CHEST  2 VIEW COMPARISON:  None FINDINGS: There is mild hyperinflation. The lungs are clear except for minimal chronic appearing interstitial coarsening. There is no pleural effusion. Pulmonary vasculature is normal. Heart size is normal. Mild aortic tortuosity. IMPRESSION: Hyperinflation and mild chronic appearing interstitial coarsening. Electronically Signed   By: Andreas Newport M.D.   On:  07/07/2015 02:12   I have personally reviewed and evaluated these images and lab results as part of my medical decision-making.   EKG Interpretation   Date/Time:  Sunday July 07 2015 01:24:59 EST Ventricular Rate:  96 PR Interval:  156 QRS Duration: 96 QT Interval:  358 QTC Calculation: 452 R Axis:   -88 Text Interpretation:  Normal sinus rhythm with sinus arrhythmia Left axis  deviation Inferior infarct , age undetermined Anterolateral infarct , age  undetermined Abnormal ECG No significant change was found Confirmed by  Mayah Urquidi  MD, Zacchaeus Halm ([id number]) on 07/07/2015 3:03:01 AM      MDM  Final diagnoses:  None    4:34 AM Patient feels much better at this time.  Ambulate around the emergency department and felt fine.  Her oxygen saturation stayed 95%.  Discharge home in good condition.  Suspect COPD exacerbation given her 70-pack-year smoking history.  Home with Levaquin as she does have some rhonchi and had fever today.  She understands to return to the emergency department for new or worsening symptoms.  She's overall well-appearing at this time.  I personally performed the services described in this documentation, which was scribed in my presence. The recorded information has been reviewed and is accurate.       Jola Schmidt, MD 07/07/15 [phone]

## 2015-07-07 NOTE — Discharge Instructions (Signed)

## 2015-07-07 NOTE — ED Notes (Signed)
Ambulated pt in hall. Pt has steady gait and maintained oxygen saturation at 95%

## 2015-07-08 ENCOUNTER — Ambulatory Visit: Payer: Medicare Other | Admitting: Neurology

## 2015-07-11 DIAGNOSIS — F17200 Nicotine dependence, unspecified, uncomplicated: Secondary | ICD-10-CM | POA: Diagnosis not present

## 2015-07-11 DIAGNOSIS — Z6827 Body mass index (BMI) 27.0-27.9, adult: Secondary | ICD-10-CM | POA: Diagnosis not present

## 2015-07-11 DIAGNOSIS — J441 Chronic obstructive pulmonary disease with (acute) exacerbation: Secondary | ICD-10-CM | POA: Diagnosis not present

## 2015-07-24 ENCOUNTER — Ambulatory Visit (INDEPENDENT_AMBULATORY_CARE_PROVIDER_SITE_OTHER): Payer: Medicare Other | Admitting: *Deleted

## 2015-07-24 DIAGNOSIS — I639 Cerebral infarction, unspecified: Secondary | ICD-10-CM

## 2015-07-25 NOTE — Progress Notes (Signed)
Carelink Summary Report / Loop Recorder 

## 2015-08-02 LAB — CUP PACEART REMOTE DEVICE CHECK
Date Time Interrogation Session: 20161119213536
Date Time Interrogation Session: 20161219213613

## 2015-08-04 ENCOUNTER — Emergency Department (HOSPITAL_COMMUNITY): Payer: Medicare Other

## 2015-08-04 ENCOUNTER — Inpatient Hospital Stay (HOSPITAL_COMMUNITY)
Admission: EM | Admit: 2015-08-04 | Discharge: 2015-08-08 | DRG: 190 | Disposition: A | Discharge status: Home Health | Payer: Medicare Other | Attending: Internal Medicine | Admitting: Internal Medicine

## 2015-08-04 ENCOUNTER — Encounter (HOSPITAL_COMMUNITY): Payer: Self-pay | Admitting: Nurse Practitioner

## 2015-08-04 DIAGNOSIS — Z720 Tobacco use: Secondary | ICD-10-CM | POA: Diagnosis not present

## 2015-08-04 DIAGNOSIS — I5022 Chronic systolic (congestive) heart failure: Secondary | ICD-10-CM | POA: Diagnosis present

## 2015-08-04 DIAGNOSIS — I2510 Atherosclerotic heart disease of native coronary artery without angina pectoris: Secondary | ICD-10-CM | POA: Diagnosis present

## 2015-08-04 DIAGNOSIS — I10 Essential (primary) hypertension: Secondary | ICD-10-CM | POA: Diagnosis not present

## 2015-08-04 DIAGNOSIS — E785 Hyperlipidemia, unspecified: Secondary | ICD-10-CM | POA: Diagnosis present

## 2015-08-04 DIAGNOSIS — Z79899 Other long term (current) drug therapy: Secondary | ICD-10-CM

## 2015-08-04 DIAGNOSIS — I110 Hypertensive heart disease with heart failure: Secondary | ICD-10-CM | POA: Diagnosis present

## 2015-08-04 DIAGNOSIS — J440 Chronic obstructive pulmonary disease with acute lower respiratory infection: Principal | ICD-10-CM | POA: Diagnosis present

## 2015-08-04 DIAGNOSIS — F17210 Nicotine dependence, cigarettes, uncomplicated: Secondary | ICD-10-CM | POA: Diagnosis present

## 2015-08-04 DIAGNOSIS — I252 Old myocardial infarction: Secondary | ICD-10-CM | POA: Diagnosis not present

## 2015-08-04 DIAGNOSIS — R9431 Abnormal electrocardiogram [ECG] [EKG]: Secondary | ICD-10-CM

## 2015-08-04 DIAGNOSIS — I255 Ischemic cardiomyopathy: Secondary | ICD-10-CM | POA: Diagnosis not present

## 2015-08-04 DIAGNOSIS — R0600 Dyspnea, unspecified: Secondary | ICD-10-CM | POA: Diagnosis not present

## 2015-08-04 DIAGNOSIS — I248 Other forms of acute ischemic heart disease: Secondary | ICD-10-CM | POA: Diagnosis not present

## 2015-08-04 DIAGNOSIS — M069 Rheumatoid arthritis, unspecified: Secondary | ICD-10-CM | POA: Diagnosis present

## 2015-08-04 DIAGNOSIS — J189 Pneumonia, unspecified organism: Secondary | ICD-10-CM | POA: Diagnosis not present

## 2015-08-04 DIAGNOSIS — E440 Moderate protein-calorie malnutrition: Secondary | ICD-10-CM | POA: Diagnosis present

## 2015-08-04 DIAGNOSIS — R7989 Other specified abnormal findings of blood chemistry: Secondary | ICD-10-CM | POA: Diagnosis not present

## 2015-08-04 DIAGNOSIS — I4581 Long QT syndrome: Secondary | ICD-10-CM

## 2015-08-04 DIAGNOSIS — Z7982 Long term (current) use of aspirin: Secondary | ICD-10-CM | POA: Diagnosis not present

## 2015-08-04 DIAGNOSIS — R0602 Shortness of breath: Secondary | ICD-10-CM | POA: Diagnosis not present

## 2015-08-04 DIAGNOSIS — Z8673 Personal history of transient ischemic attack (TIA), and cerebral infarction without residual deficits: Secondary | ICD-10-CM | POA: Diagnosis not present

## 2015-08-04 DIAGNOSIS — Z823 Family history of stroke: Secondary | ICD-10-CM

## 2015-08-04 DIAGNOSIS — R05 Cough: Secondary | ICD-10-CM | POA: Diagnosis not present

## 2015-08-04 DIAGNOSIS — R778 Other specified abnormalities of plasma proteins: Secondary | ICD-10-CM

## 2015-08-04 DIAGNOSIS — I444 Left anterior fascicular block: Secondary | ICD-10-CM | POA: Diagnosis present

## 2015-08-04 DIAGNOSIS — Z96653 Presence of artificial knee joint, bilateral: Secondary | ICD-10-CM | POA: Diagnosis present

## 2015-08-04 HISTORY — DX: Chronic obstructive pulmonary disease, unspecified: J44.9

## 2015-08-04 HISTORY — DX: Bronchitis, not specified as acute or chronic: J40

## 2015-08-04 LAB — CBC
HCT: 42 % (ref 36.0–46.0)
Hemoglobin: 14.2 g/dL (ref 12.0–15.0)
MCH: 31.4 pg (ref 26.0–34.0)
MCHC: 33.8 g/dL (ref 30.0–36.0)
MCV: 92.9 fL (ref 78.0–100.0)
PLATELETS: 263 10*3/uL (ref 150–400)
RBC: 4.52 MIL/uL (ref 3.87–5.11)
RDW: 15 % (ref 11.5–15.5)
WBC: 16.6 10*3/uL — ABNORMAL HIGH (ref 4.0–10.5)

## 2015-08-04 LAB — URINALYSIS, ROUTINE W REFLEX MICROSCOPIC
GLUCOSE, UA: NEGATIVE mg/dL
HGB URINE DIPSTICK: NEGATIVE
Ketones, ur: 15 mg/dL — AB
Leukocytes, UA: NEGATIVE
Nitrite: NEGATIVE
PH: 6 (ref 5.0–8.0)
PROTEIN: NEGATIVE mg/dL
SPECIFIC GRAVITY, URINE: 1.025 (ref 1.005–1.030)

## 2015-08-04 LAB — BASIC METABOLIC PANEL
ANION GAP: 12 (ref 5–15)
BUN: 18 mg/dL (ref 6–20)
CHLORIDE: 97 mmol/L — AB (ref 101–111)
CO2: 26 mmol/L (ref 22–32)
Calcium: 8.5 mg/dL — ABNORMAL LOW (ref 8.9–10.3)
Creatinine, Ser: 0.78 mg/dL (ref 0.44–1.00)
GFR calc Af Amer: >60 mL/min (ref >60)
GLUCOSE: 140 mg/dL — AB (ref 65–99)
POTASSIUM: 3.4 mmol/L — AB (ref 3.5–5.1)
Sodium: 135 mmol/L (ref 135–145)

## 2015-08-04 LAB — I-STAT TROPONIN, ED: TROPONIN I, POC: 1.39 ng/mL — AB (ref 0.00–0.08)

## 2015-08-04 LAB — TROPONIN I
TROPONIN I: 1.43 ng/mL — AB (ref <0.031)
Troponin I: 1.51 ng/mL (ref <0.031)

## 2015-08-04 LAB — BRAIN NATRIURETIC PEPTIDE: B NATRIURETIC PEPTIDE 5: 641.8 pg/mL — AB (ref 0.0–100.0)

## 2015-08-04 MED ORDER — FLUTICASONE FUROATE-VILANTEROL 100-25 MCG/INH IN AEPB: 1.0000 | INHALATION_SPRAY | Freq: Every day | RESPIRATORY_TRACT | Status: DC

## 2015-08-04 MED ORDER — ASPIRIN 325 MG PO TABS
325.0000 mg | ORAL_TABLET | Freq: Every day | ORAL | Status: DC
  Administered 2015-08-05 – 2015-08-08 (×4): 325 mg via ORAL
  Filled 2015-08-04 (×4): qty 1

## 2015-08-04 MED ORDER — ENSURE ENLIVE PO LIQD: 237.0000 mL | Freq: Two times a day (BID) | ORAL | Status: DC

## 2015-08-04 MED ORDER — DEXTROSE 5 % IV SOLN
500.0000 mg | Freq: Once | INTRAVENOUS | Status: AC
  Administered 2015-08-04: 500 mg via INTRAVENOUS
  Filled 2015-08-04: qty 500

## 2015-08-04 MED ORDER — SIMVASTATIN 10 MG PO TABS
10.0000 mg | ORAL_TABLET | Freq: Every day | ORAL | Status: DC
  Administered 2015-08-05 – 2015-08-07 (×3): 10 mg via ORAL
  Filled 2015-08-04 (×3): qty 1

## 2015-08-04 MED ORDER — POTASSIUM CHLORIDE CRYS ER 20 MEQ PO TBCR
40.0000 meq | EXTENDED_RELEASE_TABLET | Freq: Once | ORAL | Status: AC
  Administered 2015-08-04: 40 meq via ORAL
  Filled 2015-08-04: qty 2

## 2015-08-04 MED ORDER — DEXTROSE 5 % IV SOLN
1.0000 g | INTRAVENOUS | Status: DC
  Administered 2015-08-05 – 2015-08-07 (×3): 1 g via INTRAVENOUS
  Filled 2015-08-04 (×4): qty 10

## 2015-08-04 MED ORDER — DOXYCYCLINE HYCLATE 100 MG PO TABS
100.0000 mg | ORAL_TABLET | Freq: Two times a day (BID) | ORAL | Status: DC
  Administered 2015-08-04 – 2015-08-08 (×8): 100 mg via ORAL
  Filled 2015-08-04 (×8): qty 1

## 2015-08-04 MED ORDER — ENOXAPARIN SODIUM 60 MG/0.6ML ~~LOC~~ SOLN
60.0000 mg | Freq: Two times a day (BID) | SUBCUTANEOUS | Status: DC
  Administered 2015-08-05 – 2015-08-08 (×7): 60 mg via SUBCUTANEOUS
  Filled 2015-08-04 (×7): qty 0.6

## 2015-08-04 MED ORDER — ENOXAPARIN SODIUM 60 MG/0.6ML ~~LOC~~ SOLN
60.0000 mg | Freq: Once | SUBCUTANEOUS | Status: AC
  Administered 2015-08-04: 60 mg via SUBCUTANEOUS
  Filled 2015-08-04: qty 0.6

## 2015-08-04 MED ORDER — SODIUM CHLORIDE 0.9 % IV SOLN
INTRAVENOUS | Status: AC
  Administered 2015-08-04: via INTRAVENOUS

## 2015-08-04 MED ORDER — IOHEXOL 350 MG/ML SOLN
80.0000 mL | Freq: Once | INTRAVENOUS | Status: AC | PRN
  Administered 2015-08-04: 100 mL via INTRAVENOUS

## 2015-08-04 MED ORDER — BENAZEPRIL HCL 20 MG PO TABS
20.0000 mg | ORAL_TABLET | Freq: Every day | ORAL | Status: DC
  Filled 2015-08-04: qty 1

## 2015-08-04 MED ORDER — DEXTROSE 5 % IV SOLN
1.0000 g | Freq: Once | INTRAVENOUS | Status: AC
  Administered 2015-08-04: 1 g via INTRAVENOUS
  Filled 2015-08-04: qty 10

## 2015-08-04 MED ORDER — HYDROCODONE-ACETAMINOPHEN 5-325 MG PO TABS
1.0000 | ORAL_TABLET | Freq: Two times a day (BID) | ORAL | Status: DC
  Administered 2015-08-04 – 2015-08-08 (×8): 1 via ORAL
  Filled 2015-08-04 (×9): qty 1

## 2015-08-04 NOTE — ED Notes (Signed)
Taken to Ct at this time. 

## 2015-08-04 NOTE — ED Provider Notes (Signed)
CSN: [id number]     Arrival date & time 08/04/15  1413 History   First MD Initiated Contact with Patient 08/04/15 1506     Chief Complaint  Patient presents with  . Shortness of Breath      HPI Patient presents the emergency department with worsening shortness of breath over the past week.  She denies fevers and chills.  She does report productive cough.  She denies chest pain or chest tightness.  No prior history of congestive heart failure.  She does feel like she's had some orthopnea over the past several days.  She's tried bronchodilators at home without any improvement in her symptoms.   Past Medical History  Diagnosis Date  . Rheumatoid arthritis(714.0)   . Hypertension   . Degenerative disc disease   . Environmental allergies   . Bronchitis   . COPD (chronic obstructive pulmonary disease) [facility])    Past Surgical History  Procedure Laterality Date  . Hernia repair      umbilical  . Knee surgery Bilateral 1996, 1997    replacements  . Cholecystectomy      lap choley  . Loop recorder implant N/A 10/25/2014    Procedure: LOOP RECORDER IMPLANT;  Surgeon: Deboraha Sprang, MD;  Location: [facility] CATH LAB;  Service: Cardiovascular;  Laterality: N/A;   Family History  Problem Relation Age of Onset  . Stroke Father   . Cancer Father     lung  . Cancer Mother   . Diabetes Brother   . Stroke Maternal Grandmother    Social History  Substance Use Topics  . Smoking status: Current Every Day Smoker -- 0.00 packs/day for 50 years    Types: Cigarettes  . Smokeless tobacco: None     Comment: 01/10/15 weaning off, smokes 5 cigs daily  . Alcohol Use: No   OB History    No data available     Review of Systems  All other systems reviewed and are negative.     Allergies  Review of patient's allergies indicates no known allergies.  Home Medications   Prior to Admission medications   Medication Sig Start Date End Date Taking? Authorizing Provider  albuterol (PROVENTIL  HFA;VENTOLIN HFA) 108 (90 BASE) MCG/ACT inhaler Inhale 2 puffs into the lungs every 4 (four) hours as needed for wheezing or shortness of breath. 07/07/15  Yes Jola Schmidt, MD  alendronate (FOSAMAX) 70 MG tablet Take 70 mg by mouth once a week. Take with a full glass of water on an empty stomach.   Yes Historical Provider, MD  aspirin 325 MG tablet Take 1 tablet (325 mg total) by mouth daily. 10/25/14  Yes Eugenie Filler, MD  benazepril (LOTENSIN) 20 MG tablet Take 20 mg by mouth daily.     Yes Historical Provider, MD  Calcium Citrate-Vitamin D (CITRACAL PETITES/VITAMIN D PO) Take 1 tablet by mouth daily.    Yes Historical Provider, MD  Fluticasone Furoate-Vilanterol (BREO ELLIPTA) 100-25 MCG/INH AEPB Inhale 1 puff into the lungs daily.   Yes Historical Provider, MD  folic acid (FOLVITE) 1 MG tablet Take 1 mg by mouth daily.   Yes Historical Provider, MD  HYDROcodone-acetaminophen (NORCO/VICODIN) 5-325 MG per tablet Take 1 tablet by mouth 2 (two) times daily.  11/24/14  Yes Historical Provider, MD  InFLIXimab (REMICADE IV) Inject 200 mg into the vein every 8 (eight) weeks.    Yes Historical Provider, MD  methotrexate 2.5 MG tablet Take 15 mg by mouth once a week.  Yes Historical Provider, MD  Multiple Vitamin (MULTIVITAMIN PO) Take 1 capsule by mouth daily.    Yes Historical Provider, MD  simvastatin (ZOCOR) 10 MG tablet Take 1 tablet (10 mg total) by mouth daily at 6 PM. 10/25/14  Yes Eugenie Filler, MD  levofloxacin (LEVAQUIN) 500 MG tablet Take 1 tablet (500 mg total) by mouth daily. Patient not taking: Reported on 08/04/2015 07/07/15   Jola Schmidt, MD   BP 142/75 mmHg  Pulse 79  Temp(Src) 100.3 F (37.9 C) (Rectal)  Resp 19  SpO2 100%  LMP  (LMP Unknown) Physical Exam  Constitutional: She is oriented to person, place, and time. She appears well-developed and well-nourished. No distress.  HENT:  Head: Normocephalic and atraumatic.  Eyes: EOM are normal.  Neck: Normal range of motion.   Cardiovascular: Normal rate, regular rhythm and normal heart sounds.   Pulmonary/Chest: Effort normal. She has rales.  Abdominal: Soft. She exhibits no distension. There is no tenderness.  Musculoskeletal: Normal range of motion.  Neurological: She is alert and oriented to person, place, and time.  Skin: Skin is warm and dry.  Psychiatric: She has a normal mood and affect. Judgment normal.  Nursing note and vitals reviewed.   ED Course  Procedures (including critical care time) Labs Review Labs Reviewed  BASIC METABOLIC PANEL - Abnormal; Notable for the following:    Potassium 3.4 (*)    Chloride 97 (*)    Glucose, Bld 140 (*)    Calcium 8.5 (*)    All other components within normal limits  TROPONIN I - Abnormal; Notable for the following:    Troponin I 1.43 (*)    All other components within normal limits  BRAIN NATRIURETIC PEPTIDE - Abnormal; Notable for the following:    B Natriuretic Peptide 641.8 (*)    All other components within normal limits  CBC - Abnormal; Notable for the following:    WBC 16.6 (*)    All other components within normal limits  I-STAT TROPOININ, ED - Abnormal; Notable for the following:    Troponin i, poc 1.39 (*)    All other components within normal limits    Imaging Review Dg Chest 2 View  08/04/2015  CLINICAL DATA:  Progressive shortness of breath and productive cough. EXAM: CHEST  2 VIEW COMPARISON:  07/07/2015 FINDINGS: There is a faint peripheral infiltrate at the left lung base. There is prominent peribronchial thickening bilaterally. No effusions. There is tortuosity and calcification of the thoracic aorta. Heart size is at the upper limits are normal. No acute osseous abnormality. IMPRESSION: New faint peripheral infiltrate at the left lung base. Prominent bronchitic changes. Aortic atherosclerosis. Electronically Signed   By: Lorriane Shire M.D.   On: 08/04/2015 15:35   I have personally reviewed and evaluated these images and lab results as  part of my medical decision-making.   EKG Interpretation   Date/Time:  Sunday August 04 2015 15:32:07 EST Ventricular Rate:  83 PR Interval:  164 QRS Duration: 107 QT Interval:  446 QTC Calculation: 524 R Axis:   -73 Text Interpretation:  Sinus rhythm Probable left atrial enlargement Left  anterior fascicular block Left ventricular hypertrophy Probable anterior  infarct, age indeterminate Lateral leads are also involved Prolonged QT  interval nonspecific anterior ST changes as compared to prior ecg  Confirmed by Kehinde Totzke  MD, Lennette Bihari ([id number]) on 08/04/2015 7:18:00 PM      MDM   Final diagnoses:  CAP (community acquired pneumonia)  Elevated troponin  There is question of a possible infiltrate.  Given her cautiously started on antibiotics for coming acquired pneumonia.  She does have elevated BUN and troponin.  Nonspecific anterior EKG changes.  I spoke with Dr. Percival Spanish of cardiology who recommends admission to the hospitalist service at this time.  He requests hospitalist reconsult the cardiology team after their evaluation if the hospitalist deems this necessary.  No active chest pain or chest tightness at this time.      Jola Schmidt, MD 08/04/15 [phone]

## 2015-08-04 NOTE — ED Notes (Addendum)
She c/o 1 month history progressively worse SOB and productive cough, onset after she had bronchitis, was here in beginning of December for same. States now she can not sleep because she is so SOB. She has been using her inhalers her doctor gave her with no relief. She denies pain.

## 2015-08-04 NOTE — Progress Notes (Signed)
ANTICOAGULATION CONSULT NOTE - Initial Consult  Pharmacy Consult for Lovenox Indication: chest pain/ACS  No Known Allergies  Patient Measurements: Height: 5' (152.4 cm) Weight: 132 lb 11.5 oz (60.2 kg) IBW/kg (Calculated) : 45.5  Vital Signs: Temp: 99.3 F (37.4 C) (01/01 2236) Temp Source: Oral (01/01 2236) BP: 148/77 mmHg (01/01 2236) Pulse Rate: 92 (01/01 2236)  Labs:  Recent Labs  08/04/15 1439 08/04/15 1745 08/04/15 2044  HGB  --  14.2  --   HCT  --  42.0  --   PLT  --  263  --   CREATININE 0.78  --   --   TROPONINI 1.43*  --  1.51*    Estimated Creatinine Clearance: 40.2 mL/min (by C-G formula based on Cr of 0.78).   Medical History: Past Medical History  Diagnosis Date  . Rheumatoid arthritis(714.0)   . Hypertension   . Degenerative disc disease   . Environmental allergies   . Bronchitis   . COPD (chronic obstructive pulmonary disease) (HCC)     Medications:  Prescriptions prior to admission  Medication Sig Dispense Refill Last Dose  . albuterol (PROVENTIL HFA;VENTOLIN HFA) 108 (90 BASE) MCG/ACT inhaler Inhale 2 puffs into the lungs every 4 (four) hours as needed for wheezing or shortness of breath. 1 Inhaler 0 08/03/2015 at Unknown time  . alendronate (FOSAMAX) 70 MG tablet Take 70 mg by mouth once a week. Take with a full glass of water on an empty stomach.   07/28/2015 at Unknown time  . aspirin 325 MG tablet Take 1 tablet (325 mg total) by mouth daily.   08/04/2015 at Unknown time  . benazepril (LOTENSIN) 20 MG tablet Take 20 mg by mouth daily.     08/04/2015 at Unknown time  . Calcium Citrate-Vitamin D (CITRACAL PETITES/VITAMIN D PO) Take 1 tablet by mouth daily.    08/04/2015 at Unknown time  . Fluticasone Furoate-Vilanterol (BREO ELLIPTA) 100-25 MCG/INH AEPB Inhale 1 puff into the lungs daily.   08/04/2015 at Unknown time  . folic acid (FOLVITE) 1 MG tablet Take 1 mg by mouth daily.   08/04/2015 at Unknown time  . HYDROcodone-acetaminophen (NORCO/VICODIN)  5-325 MG per tablet Take 1 tablet by mouth 2 (two) times daily.    08/04/2015 at Unknown time  . InFLIXimab (REMICADE IV) Inject 200 mg into the vein every 8 (eight) weeks.    06/19/2015  . methotrexate 2.5 MG tablet Take 15 mg by mouth once a week.    08/03/2015 at Unknown time  . Multiple Vitamin (MULTIVITAMIN PO) Take 1 capsule by mouth daily.    08/04/2015 at Unknown time  . simvastatin (ZOCOR) 10 MG tablet Take 1 tablet (10 mg total) by mouth daily at 6 PM. 30 tablet 0 08/03/2015 at Unknown time  . levofloxacin (LEVAQUIN) 500 MG tablet Take 1 tablet (500 mg total) by mouth daily. (Patient not taking: Reported on 08/04/2015) 7 tablet 0 Not Taking at Unknown time    Assessment: 82 y.o. F presents with SOB. Troponin elevated to 1.51. CBC ok on admission. Estimated CrCl 40 ml/min. Pt received '60mg'$  Lovenox ~2100 today in ED. To continue Lovenox for r/o ACS.  Goal of Therapy:  Anti-Xa level 0.6-1 units/ml 4hrs after LMWH dose given Monitor platelets by anticoagulation protocol: Yes   Plan:  Lovenox '60mg'$  SQ q12h CBC q72hr while on Lovenox  Sherlon Handing, PharmD, BCPS Clinical pharmacist, pager [phone] 08/04/2015,10:42 PM

## 2015-08-04 NOTE — ED Notes (Signed)
Spoke with lab regarding troponin result.  In progress.  To be complete in 5 minutes per lab staff.

## 2015-08-04 NOTE — H&P (Signed)
PCP: Haywood Pao, MD  Cardiology [facility] [place]  Referring provider [facility]   Chief Complaint:  Shortness of breath  HPI: Debbie Kramer is a 82 y.o. female   has a past medical history of Rheumatoid arthritis(714.0); Hypertension; Degenerative disc disease; Environmental allergies; Bronchitis; and COPD (chronic obstructive pulmonary disease) ([place]).   Presented with patient presented with 1 months history of progressive shortness of breath and some coughing. Lately productive of yellow mucus.  worse shortness of breath with laying down. Attempted to use inhalers prescribed by her physician with little response. For past 1 week of symptoms have gotten worse.Yesterday she had low grade fever up to 100 some chills denies any chest pain. She still smokes. In emergency department chest x-ray showed new faint. Peripheral infiltrate left lung base. She was noted to have elevated troponin up to 1.43 and white blood cell count up to 16.6 CT of the chest was ordered given shortness of breath and elevated troponin with EKG showing left fascicular block Hospitalist was called for admission for presumed computer cord pneumonia and elevated troponin.  Of note patient has history of CVA in March 2016 showing right posterior MCA territory ischemic stroke, coronary and her sound showed normal extracranial stenosis patient was seen by cardiology and had Loop recorder inserted. Thoracic echo gram at that time was unremarkable he has been treated on aspirin since then she has residual peripheral vision loss on the left    Review of Systems:    Pertinent positives include: shortness of breath at rest. dyspnea on exertion, productive cough, orthopnea  Fevers, chills,  Constitutional:  No weight loss, night sweats,fatigue, weight loss  HEENT:  No headaches, Difficulty swallowing,Tooth/dental problems,Sore throat,  No sneezing, itching, ear ache, nasal congestion, post nasal drip,    Cardio-vascular:  No chest pain, Orthopnea, PND, anasarca, dizziness, palpitations.no Bilateral lower extremity swelling  GI:  No heartburn, indigestion, abdominal pain, nausea, vomiting, diarrhea, change in bowel habits, loss of appetite, melena, blood in stool, hematemesis Resp:   No excess mucus, no  No non-productive cough, No coughing up of blood.No change in color of mucus.No wheezing. Skin:  no rash or lesions. No jaundice GU:  no dysuria, change in color of urine, no urgency or frequency. No straining to urinate.  No flank pain.  Musculoskeletal:  No joint pain or no joint swelling. No decreased range of motion. No back pain.  Psych:  No change in mood or affect. No depression or anxiety. No memory loss.  Neuro: no localizing neurological complaints, no tingling, no weakness, no double vision, no gait abnormality, no slurred speech, no confusion  Otherwise ROS are negative except for above, 10 systems were reviewed  Past Medical History: Past Medical History  Diagnosis Date  . Rheumatoid arthritis(714.0)   . Hypertension   . Degenerative disc disease   . Environmental allergies   . Bronchitis   . COPD (chronic obstructive pulmonary disease) [facility])    Past Surgical History  Procedure Laterality Date  . Hernia repair      umbilical  . Knee surgery Bilateral 1996, 1997    replacements  . Cholecystectomy      lap choley  . Loop recorder implant N/A 10/25/2014    Procedure: LOOP RECORDER IMPLANT;  Surgeon: Deboraha Sprang, MD;  Location: [facility] CATH LAB;  Service: Cardiovascular;  Laterality: N/A;     Medications: Prior to Admission medications   Medication Sig Start Date End Date Taking? Authorizing Provider  albuterol (PROVENTIL HFA;VENTOLIN HFA) 108 (90 BASE) MCG/ACT inhaler Inhale 2 puffs into the lungs every 4 (four) hours as needed for wheezing or shortness of breath. 07/07/15  Yes Jola Schmidt, MD  alendronate (FOSAMAX) 70 MG tablet Take 70 mg by mouth once a week.  Take with a full glass of water on an empty stomach.   Yes Historical Provider, MD  aspirin 325 MG tablet Take 1 tablet (325 mg total) by mouth daily. 10/25/14  Yes Eugenie Filler, MD  benazepril (LOTENSIN) 20 MG tablet Take 20 mg by mouth daily.     Yes Historical Provider, MD  Calcium Citrate-Vitamin D (CITRACAL PETITES/VITAMIN D PO) Take 1 tablet by mouth daily.    Yes Historical Provider, MD  Fluticasone Furoate-Vilanterol (BREO ELLIPTA) 100-25 MCG/INH AEPB Inhale 1 puff into the lungs daily.   Yes Historical Provider, MD  folic acid (FOLVITE) 1 MG tablet Take 1 mg by mouth daily.   Yes Historical Provider, MD  HYDROcodone-acetaminophen (NORCO/VICODIN) 5-325 MG per tablet Take 1 tablet by mouth 2 (two) times daily.  11/24/14  Yes Historical Provider, MD  InFLIXimab (REMICADE IV) Inject 200 mg into the vein every 8 (eight) weeks.    Yes Historical Provider, MD  methotrexate 2.5 MG tablet Take 15 mg by mouth once a week.    Yes Historical Provider, MD  Multiple Vitamin (MULTIVITAMIN PO) Take 1 capsule by mouth daily.    Yes Historical Provider, MD  simvastatin (ZOCOR) 10 MG tablet Take 1 tablet (10 mg total) by mouth daily at 6 PM. 10/25/14  Yes Eugenie Filler, MD  levofloxacin (LEVAQUIN) 500 MG tablet Take 1 tablet (500 mg total) by mouth daily. Patient not taking: Reported on 08/04/2015 07/07/15   Jola Schmidt, MD    Allergies:  No Known Allergies  Social History:  Ambulatory  independently  Or cane Lives at home  With family     reports that she has been smoking Cigarettes.  She has been smoking about 0.00 packs per day for the past 50 years. She does not have any smokeless tobacco history on file. She reports that she does not drink alcohol or use illicit drugs.     Family History: family history includes Cancer in her father and mother; Diabetes in her brother; Stroke in her father and maternal grandmother.    Physical Exam: Patient Vitals for the past 24 hrs:  BP Temp Temp  src Pulse Resp SpO2 Weight  08/04/15 1900 149/77 mmHg - - 91 20 97 % 62.71 kg (138 lb 4 oz)  08/04/15 1830 151/77 mmHg - - 87 (!) 27 96 % -  08/04/15 1800 141/72 mmHg - - 84 20 96 % -  08/04/15 1730 142/75 mmHg - - 79 19 100 % -  08/04/15 1700 148/74 mmHg - - 80 23 100 % -  08/04/15 1630 129/65 mmHg - - 75 20 100 % -  08/04/15 1600 132/70 mmHg - - 75 20 97 % -  08/04/15 1544 - - - 77 18 100 % -  08/04/15 1535 - 100.3 F (37.9 C) Rectal - - - -  08/04/15 1533 136/80 mmHg - - - - - -    1. General:  in No Acute distress 2. Psychological: Alert and   Oriented 3. Head/ENT:    Dry Mucous Membranes                          Head Non traumatic, neck  supple                          Normal   Dentition 4. SKIN:   decreased Skin turgor,  Skin clean Dry and intact no rash 5. Heart: Regular rate and rhythm no Murmur, Rub or gallop 6. Lungs:   no wheezes mild crackles   7. Abdomen: Soft, non-tender, Non distended 8. Lower extremities: no clubbing, cyanosis, or edema 9. Neurologically Grossly intact, moving all 4 extremities equally 10. MSK: Normal range of motion  body mass index is 27 kg/(m^2).   Labs on Admission:   Results for orders placed or performed during the hospital encounter of 08/04/15 (from the past 24 hour(s))  Basic metabolic panel     Status: Abnormal   Collection Time: 08/04/15  2:39 PM  Result Value Ref Range   Sodium 135 135 - 145 mmol/L   Potassium 3.4 (L) 3.5 - 5.1 mmol/L   Chloride 97 (L) 101 - 111 mmol/L   CO2 26 22 - 32 mmol/L   Glucose, Bld 140 (H) 65 - 99 mg/dL   BUN 18 6 - 20 mg/dL   Creatinine, Ser 0.78 0.44 - 1.00 mg/dL   Calcium 8.5 (L) 8.9 - 10.3 mg/dL   GFR calc non Af Amer >60 >60 mL/min   GFR calc Af Amer >60 >60 mL/min   Anion gap 12 5 - 15  Troponin I     Status: Abnormal   Collection Time: 08/04/15  2:39 PM  Result Value Ref Range   Troponin I 1.43 (HH) <0.031 ng/mL  Brain natriuretic peptide     Status: Abnormal   Collection Time: 08/04/15   2:39 PM  Result Value Ref Range   B Natriuretic Peptide 641.8 (H) 0.0 - 100.0 pg/mL  I-stat troponin, ED (not at [facility])     Status: Abnormal   Collection Time: 08/04/15  2:47 PM  Result Value Ref Range   Troponin i, poc 1.39 (HH) 0.00 - 0.08 ng/mL   Comment NOTIFIED PHYSICIAN    Comment 3          CBC     Status: Abnormal   Collection Time: 08/04/15  5:45 PM  Result Value Ref Range   WBC 16.6 (H) 4.0 - 10.5 K/uL   RBC 4.52 3.87 - 5.11 MIL/uL   Hemoglobin 14.2 12.0 - 15.0 g/dL   HCT 42.0 36.0 - 46.0 %   MCV 92.9 78.0 - 100.0 fL   MCH 31.4 26.0 - 34.0 pg   MCHC 33.8 30.0 - 36.0 g/dL   RDW 15.0 11.5 - 15.5 %   Platelets 263 150 - 400 K/uL    UA ordered  Lab Results  Component Value Date   HGBA1C 5.6 10/24/2014    Estimated Creatinine Clearance: 41 mL/min (by C-G formula based on Cr of 0.78).  BNP (last 3 results) No results for input(s): PROBNP in the last 8760 hours.  Other results:  I have pearsonaly reviewed this: ECG REPORT  Rate:83  Rhythm: NSR with L fasicular block ST&T Change: non specific ST changes QTC 524   Filed Weights   08/04/15 1900  Weight: 62.71 kg (138 lb 4 oz)     Cultures: No results found for: SDES, SPECREQUEST, CULT, REPTSTATUS   Radiological Exams on Admission: Dg Chest 2 View  08/04/2015  CLINICAL DATA:  Progressive shortness of breath and productive cough. EXAM: CHEST  2 VIEW COMPARISON:  07/07/2015 FINDINGS: There is  a faint peripheral infiltrate at the left lung base. There is prominent peribronchial thickening bilaterally. No effusions. There is tortuosity and calcification of the thoracic aorta. Heart size is at the upper limits are normal. No acute osseous abnormality. IMPRESSION: New faint peripheral infiltrate at the left lung base. Prominent bronchitic changes. Aortic atherosclerosis. Electronically Signed   By: Lorriane Shire M.D.   On: 08/04/2015 15:35   Ct Angio Chest Pe W/cm &/or Wo Cm  08/04/2015  CLINICAL DATA:   Progressive shortness of breath for 1 week. Productive cough. EXAM: CT ANGIOGRAPHY CHEST WITH CONTRAST TECHNIQUE: Multidetector CT imaging of the chest was performed using the standard protocol during bolus administration of intravenous contrast. Multiplanar CT image reconstructions and MIPs were obtained to evaluate the vascular anatomy. CONTRAST:  28m OMNIPAQUE IOHEXOL 350 MG/ML SOLN COMPARISON:  Chest radiograph earlier this day. FINDINGS: There are no filling defects within the pulmonary arteries to suggest pulmonary embolus. Atherosclerosis throughout the thoracic aorta, no aneurysm. There are coronary artery calcifications. Dense mitral annulus calcifications. No pericardial effusion. No mediastinal or hilar adenopathy. Severe bronchial thickening, most prominent in the lower lobes, left greater than right. Ill-defined linear and patchy opacities in the lingula and left lower lobe. Ill-defined opacities in the posterior upper lobes, left greater than right, some of which appear nodular. Within the paramediastinal left lung apex with a 2.1 x 1.5 cm confluent opacity that abuts the left subclavian artery. Calcified granuloma in the right upper lobe, image 30. There is a small left pleural effusion. No definite right pleural effusion. Extensive atherosclerosis of the included upper abdominal vasculature. Calcified granuloma in the liver. Subcutaneous lipoma involving the left lateral lower chest wall, measures 6.9 x 3.4 cm. Small to moderate hiatal hernia. Implanted monitoring device in the anterior left chest wall. Advanced multilevel degenerative change throughout the thoracic spine. There are no acute or suspicious osseous abnormalities. Review of the MIP images confirms the above findings. IMPRESSION: 1. No pulmonary embolus. 2. Severe bronchial thickening with multifocal patchy, confluent, and linear opacities throughout all lobes both lungs, left greater than right. Findings are most significant in the  left lung base involving the lingula and lower lobe. Suspect bronchitis with multifocal pneumonia, however some of these have a nodular appearance. The confluent left apical opacity abuts the mediastinum, and attention to this at follow-up is recommended. Recommend follow-up chest CT in 3-4 months. 3. Severe atherosclerosis including the coronary arteries. Electronically Signed   By: MJeb LeveringM.D.   On: 08/04/2015 20:49    Chart has been reviewed  Family  at  Bedside  plan of care was discussed with  Daughter TOtila KluverFlinchum  Assessment/Plan 82year old female history of CVA here with possible CAP elevated troponin and abnormal ECG cardiology is aware, will cover for CAP and continue lovenox  Present on Admission:   . CAP (community acquired pneumonia) -  - will admit for treatment of CAP will start on appropriate antibiotic coverage.   Obtain sputum cultures, blood cultures if febrile or if decompensates.  Provide oxygen as needed.  Avoid azyhromycine due to prolonged QTC . Essential hypertension - continue home meds currently stable . Elevated troponin - most likely due to demand ischemia but given abnormal ECG will consult cardiology, continue lovenox  . Abnormal ECG - monitor on tele, obtain ECHo, cycle CE, consult to Cardiology . Tobacco abuse - discussed importance of quiting . Prolonged QT  - interval avoid QT prolonging medications    Prophylaxis: Lovenox   CODE  STATUS:  FULL CODE   as per patient    Disposition:  To home once workup is complete and patient is stable  Other plan as per orders.  I have spent a total of 65mn on this admission extra time was spent to discuss case with Cardiology  DDeming1/08/2015, 9:55 PM    [facility]  Pager [phone]  after 2 AM please page floor coverage PA If 7AM-7PM, please contact the day team taking care of the patient  [email or web address]  Password [email or web address]

## 2015-08-04 NOTE — Consult Note (Addendum)
CARDIOLOGY CONSULT NOTE  Patient ID: Debbie Kramer MRN: [id number] DOB/AGE: 08/12/1925 82 y.o.  Admit date: 08/04/2015 Primary Physician Haywood Pao, MD Primary Cardiologist   Dr. Caryl Comes Reason for Consultation  Elevated troponin Requesting MD  Dr. Roel Cluck  HPI:  The patient is being admitted with SOB and probable pneumonia.  She presented with dyspnea.  She is not having any substernal chest pain.    Her troponin in the ED was mildly elevated and we are being asked to evaluate.  Chest CT did demonstrated atherosclerosis in the coronaries.  There was no evidence of PE.  Images were consistent with bronchitis and multifocal pneumonia.  There are other abnormalities as described below.   She has no past history of CAD. The patient does have a history of cryptogenic stroke with a loop implant.  She did have an echocardiogram in March of last year with moderate to severe LVH.  She had mild AS.  Systolic function was normal.    She reports that she has been having increased SOB for the last two days.  She has had cough with "yellow" phlegm.  She had a mild fever and has been treating herself with OTC.  Last night was particularly difficult because she could not sleep in the bed.  She slept up in the chair.  She has a left lower/axillary rib cage discomfort related to coughing.  She denies any substernal chest pressure.  She is comfortable currently and lying in the bed.    Past Medical History  Diagnosis Date  . Rheumatoid arthritis(714.0)   . Hypertension   . Degenerative disc disease   . Environmental allergies   . Bronchitis   . COPD (chronic obstructive pulmonary disease) [facility])     Past Surgical History  Procedure Laterality Date  . Hernia repair      umbilical  . Knee surgery Bilateral 1996, 1997    replacements  . Cholecystectomy      lap choley  . Loop recorder implant N/A 10/25/2014    Procedure: LOOP RECORDER IMPLANT;  Surgeon: Deboraha Sprang, MD;  [facility];   Service: Cardiovascular;  Laterality: N/A;    No Known Allergies Prescriptions prior to admission  Medication Sig Dispense Refill Last Dose  . albuterol (PROVENTIL HFA;VENTOLIN HFA) 108 (90 BASE) MCG/ACT inhaler Inhale 2 puffs into the lungs every 4 (four) hours as needed for wheezing or shortness of breath. 1 Inhaler 0 08/03/2015 at Unknown time  . alendronate (FOSAMAX) 70 MG tablet Take 70 mg by mouth once a week. Take with a full glass of water on an empty stomach.   07/28/2015 at Unknown time  . aspirin 325 MG tablet Take 1 tablet (325 mg total) by mouth daily.   08/04/2015 at Unknown time  . benazepril (LOTENSIN) 20 MG tablet Take 20 mg by mouth daily.     08/04/2015 at Unknown time  . Calcium Citrate-Vitamin D (CITRACAL PETITES/VITAMIN D PO) Take 1 tablet by mouth daily.    08/04/2015 at Unknown time  . Fluticasone Furoate-Vilanterol (BREO ELLIPTA) 100-25 MCG/INH AEPB Inhale 1 puff into the lungs daily.   08/04/2015 at Unknown time  . folic acid (FOLVITE) 1 MG tablet Take 1 mg by mouth daily.   08/04/2015 at Unknown time  . HYDROcodone-acetaminophen (NORCO/VICODIN) 5-325 MG per tablet Take 1 tablet by mouth 2 (two) times daily.    08/04/2015 at Unknown time  . InFLIXimab (REMICADE IV) Inject 200 mg into the vein every 8 (  eight) weeks.    06/19/2015  . methotrexate 2.5 MG tablet Take 15 mg by mouth once a week.    08/03/2015 at Unknown time  . Multiple Vitamin (MULTIVITAMIN PO) Take 1 capsule by mouth daily.    08/04/2015 at Unknown time  . simvastatin (ZOCOR) 10 MG tablet Take 1 tablet (10 mg total) by mouth daily at 6 PM. 30 tablet 0 08/03/2015 at Unknown time  . levofloxacin (LEVAQUIN) 500 MG tablet Take 1 tablet (500 mg total) by mouth daily. (Patient not taking: Reported on 08/04/2015) 7 tablet 0 Not Taking at Unknown time   Family History  Problem Relation Age of Onset  . Stroke Father   . Cancer Father     lung  . Cancer Mother   . Diabetes Brother   . Stroke Maternal Grandmother     Social  History   Social History  . Marital Status: Widowed    Spouse Name: N/A  . Number of Children: 5  . Years of Education: 10   Occupational History  . florist     retired   Social History Main Topics  . Smoking status: Current Every Day Smoker -- 0.00 packs/day for 50 years    Types: Cigarettes  . Smokeless tobacco: Not on file     Comment: 01/10/15 weaning off, smokes 5 cigs daily  . Alcohol Use: No  . Drug Use: No  . Sexual Activity: Not on file   Other Topics Concern  . Not on file   Social History Narrative   Lives with daughter, Otila Kluver; widowed   Right handed   Caffeine use - none     ROS:    As stated in the HPI and negative for all other systems.  Physical Exam: Blood pressure 148/77, pulse 92, temperature 99.3 F (37.4 C), temperature source Oral, resp. rate 15, height 5' (1.524 m), weight 132 lb 11.5 oz (60.2 kg), SpO2 97 %.  GENERAL:  No distress and well appearing and pleasant for her age.  HEENT:  Pupils equal round and reactive, fundi not visualized, oral mucosa unremarkable, edentulous NECK:  No jugular venous distention, waveform within normal limits, carotid upstroke brisk and symmetric, no bruits, no thyromegaly LYMPHATICS:  No cervical, inguinal adenopathy LUNGS:  Decreased breath sounds bilaterally, scattered coarse breath sounds. BACK:  No CVA tenderness CHEST:  Unremarkable HEART:  PMI not displaced or sustained,S1 and S2 within normal limits, no S3, no S4, no clicks, no rubs, 2/6 systolic murmur at the apex, no diastolic ABD:  Flat, positive bowel sounds normal in frequency in pitch, no bruits, no rebound, no guarding, no midline pulsatile mass, no hepatomegaly, no splenomegaly EXT:  2 plus pulses throughout, no edema, no cyanosis no clubbing, RA changes.   SKIN:  No rashes no nodules NEURO:  Cranial nerves II through XII grossly intact, motor grossly intact throughout PSYCH:  Cognitively intact, oriented to person place and time   Labs: Lab Results    Component Value Date   BUN 18 08/04/2015   Lab Results  Component Value Date   CREATININE 0.78 08/04/2015   Lab Results  Component Value Date   NA 135 08/04/2015   K 3.4* 08/04/2015   CL 97* 08/04/2015   CO2 26 08/04/2015   Lab Results  Component Value Date   TROPONINI 1.51* 08/04/2015   Lab Results  Component Value Date   WBC 16.6* 08/04/2015   HGB 14.2 08/04/2015   HCT 42.0 08/04/2015   MCV 92.9 08/04/2015  PLT 263 08/04/2015   Lab Results  Component Value Date   TROPONINI 1.51* 08/04/2015     Radiology:   CT : 1. No pulmonary embolus. 2. Severe bronchial thickening with multifocal patchy, confluent, and linear opacities throughout all lobes both lungs, left greater than right. Findings are most significant in the left lung base involving the lingula and lower lobe. Suspect bronchitis with multifocal pneumonia, however some of these have a nodular appearance. The confluent left apical opacity abuts the mediastinum, and attention to this at follow-up is recommended. Recommend follow-up chest CT in 3-4 months. 3. Severe atherosclerosis including the coronary arteries.  EKG:  NSR, rate 93, LAD, probable old inferior infarct, possible old anteroseptal MI,  QT prolonged new compared to previous EKG.  Anterolateral T wave inversion V4 -V6.    ASSESSMENT AND PLAN:   ELEVATED TROPONIN:  Possibly demand ischemia.  Continue to cycle cardiac enzymes.  Check echocardiogram in the ED.  Continue Lovenox.  No indication for urgent cardiovascular intervention.  Continue ASA.      TOBACCO ABUSE:  She is committed to quitting smoking.     SignedMinus Breeding 08/04/2015, 10:40 PM

## 2015-08-04 NOTE — ED Notes (Signed)
Called xray and spoke with Tess to have patient to go to room A4 when finished

## 2015-08-04 NOTE — ED Notes (Signed)
Pt in xray

## 2015-08-05 ENCOUNTER — Inpatient Hospital Stay (HOSPITAL_COMMUNITY): Payer: Medicare Other

## 2015-08-05 ENCOUNTER — Encounter (HOSPITAL_COMMUNITY): Payer: Self-pay | Admitting: Cardiology

## 2015-08-05 DIAGNOSIS — I110 Hypertensive heart disease with heart failure: Secondary | ICD-10-CM

## 2015-08-05 DIAGNOSIS — Z720 Tobacco use: Secondary | ICD-10-CM

## 2015-08-05 DIAGNOSIS — I10 Essential (primary) hypertension: Secondary | ICD-10-CM

## 2015-08-05 DIAGNOSIS — I248 Other forms of acute ischemic heart disease: Secondary | ICD-10-CM

## 2015-08-05 DIAGNOSIS — R0600 Dyspnea, unspecified: Secondary | ICD-10-CM

## 2015-08-05 LAB — CBC WITH DIFFERENTIAL/PLATELET
BASOS ABS: 0 10*3/uL (ref 0.0–0.1)
BASOS PCT: 0 %
EOS PCT: 0 %
Eosinophils Absolute: 0 10*3/uL (ref 0.0–0.7)
HEMATOCRIT: 37.3 % (ref 36.0–46.0)
Hemoglobin: 12.3 g/dL (ref 12.0–15.0)
Lymphocytes Relative: 15 %
Lymphs Abs: 1.5 10*3/uL (ref 0.7–4.0)
MCH: 30.5 pg (ref 26.0–34.0)
MCHC: 33 g/dL (ref 30.0–36.0)
MCV: 92.6 fL (ref 78.0–100.0)
MONO ABS: 0.5 10*3/uL (ref 0.1–1.0)
MONOS PCT: 5 %
NEUTROS ABS: 8.2 10*3/uL — AB (ref 1.7–7.7)
Neutrophils Relative %: 80 %
PLATELETS: 258 10*3/uL (ref 150–400)
RBC: 4.03 MIL/uL (ref 3.87–5.11)
RDW: 15 % (ref 11.5–15.5)
WBC: 10.2 10*3/uL (ref 4.0–10.5)

## 2015-08-05 LAB — COMPREHENSIVE METABOLIC PANEL
ALBUMIN: 2.3 g/dL — AB (ref 3.5–5.0)
ALK PHOS: 63 U/L (ref 38–126)
ALT: 18 U/L (ref 14–54)
AST: 28 U/L (ref 15–41)
Anion gap: 12 (ref 5–15)
BILIRUBIN TOTAL: 0.8 mg/dL (ref 0.3–1.2)
BUN: 11 mg/dL (ref 6–20)
CALCIUM: 7.9 mg/dL — AB (ref 8.9–10.3)
CO2: 26 mmol/L (ref 22–32)
Chloride: 100 mmol/L — ABNORMAL LOW (ref 101–111)
Creatinine, Ser: 0.7 mg/dL (ref 0.44–1.00)
GFR calc Af Amer: >60 mL/min (ref >60)
GFR calc non Af Amer: >60 mL/min (ref >60)
GLUCOSE: 89 mg/dL (ref 65–99)
Potassium: 3.8 mmol/L (ref 3.5–5.1)
SODIUM: 138 mmol/L (ref 135–145)
TOTAL PROTEIN: 5.9 g/dL — AB (ref 6.5–8.1)

## 2015-08-05 LAB — TROPONIN I
TROPONIN I: 1.36 ng/mL — AB (ref <0.031)
Troponin I: 0.94 ng/mL (ref <0.031)

## 2015-08-05 LAB — STREP PNEUMONIAE URINARY ANTIGEN: Strep Pneumo Urinary Antigen: NEGATIVE

## 2015-08-05 MED ORDER — MAGNESIUM SULFATE IN D5W 10-5 MG/ML-% IV SOLN
1.0000 g | Freq: Once | INTRAVENOUS | Status: AC
  Administered 2015-08-05: 1 g via INTRAVENOUS
  Filled 2015-08-05: qty 100

## 2015-08-05 MED ORDER — METOPROLOL TARTRATE 12.5 MG HALF TABLET: 12.5000 mg | ORAL_TABLET | Freq: Two times a day (BID) | ORAL | Status: DC

## 2015-08-05 MED ORDER — BOOST / RESOURCE BREEZE PO LIQD
1.0000 | Freq: Three times a day (TID) | ORAL | Status: DC
  Administered 2015-08-06 – 2015-08-08 (×5): 1 via ORAL

## 2015-08-05 MED ORDER — SODIUM CHLORIDE 0.9 % IV SOLN
INTRAVENOUS | Status: DC
  Administered 2015-08-05: 13:00:00 via INTRAVENOUS

## 2015-08-05 MED ORDER — BENAZEPRIL HCL 10 MG PO TABS
10.0000 mg | ORAL_TABLET | Freq: Every day | ORAL | Status: DC
  Administered 2015-08-06 – 2015-08-08 (×3): 10 mg via ORAL
  Filled 2015-08-05 (×3): qty 1

## 2015-08-05 MED ORDER — PERFLUTREN LIPID MICROSPHERE
1.0000 mL | INTRAVENOUS | Status: AC | PRN
  Administered 2015-08-05: 2 mL via INTRAVENOUS
  Filled 2015-08-05: qty 10

## 2015-08-05 MED ORDER — METOPROLOL TARTRATE 50 MG PO TABS
50.0000 mg | ORAL_TABLET | Freq: Two times a day (BID) | ORAL | Status: DC
  Administered 2015-08-05 – 2015-08-08 (×6): 50 mg via ORAL
  Filled 2015-08-05 (×6): qty 1

## 2015-08-05 NOTE — Progress Notes (Signed)
  Echocardiogram 2D Echocardiogram with Definity has been performed.  Tresa Res 08/05/2015, 11:48 AM

## 2015-08-05 NOTE — Progress Notes (Signed)
Patient Demographics:    Debbie Kramer, is a 82 y.o. female, DOB - 1925/08/15, TDD:[id number]  Admit date - 08/04/2015   Admitting Physician Toy Baker, MD  Outpatient Primary MD for the patient is Haywood Pao, MD  LOS - 1   Chief Complaint  Patient presents with  . Shortness of Breath        Subjective:    Debbie Kramer today has, No headache, No chest pain, No abdominal pain - No Nausea, No new weakness tingling or numbness, approved cough and shortness of breath.   Assessment  & Plan :     1. CAP. Noted CT scan, she already feels better on empiric IV antibiotics which will be continued, continue supportive care with oxygen and nebulizer treatments, monitor cultures.  2. NSTEMI. Cardiology on board, chest pain-free, EKG nonacute, on aspirin, beta blocker and statin for secondary prevention along with Lovenox. Echogram pending. Will monitor.  3. Essential hypertension. Continue ACE inhibitor, beta blocker, have increased beta blocker dose and dropped ACE inhibitor for better heart rate control.  4. Dyslipidemia. On statin continue.  5. Mediastinal fullness noted on CT scan chest. Needs repeat CT scan of the chest in 3 months.    Code Status : Full  Family Communication  : None present  Disposition Plan  : Likely SNF in 2-3 days  Consults  :  Cards  Procedures  :   CT chest - confirms pneumonia, mediastinal fullness needs repeat CT scan in 3 months.  Echogram pending  DVT Prophylaxis  :  Lovenox    Lab Results  Component Value Date   PLT 258 08/05/2015    Inpatient Medications  Scheduled Meds: . aspirin  325 mg Oral Daily  . benazepril  20 mg Oral Daily  . cefTRIAXone (ROCEPHIN)  IV  1 g Intravenous Q24H  . doxycycline  100 mg Oral Q12H  . enoxaparin  (LOVENOX) injection  60 mg Subcutaneous Q12H  . feeding supplement (ENSURE ENLIVE)  237 mL Oral BID BM  . Fluticasone Furoate-Vilanterol  1 puff Inhalation Daily  . HYDROcodone-acetaminophen  1 tablet Oral BID  . metoprolol tartrate  12.5 mg Oral BID  . simvastatin  10 mg Oral q1800   Continuous Infusions:  PRN Meds:.perflutren lipid microspheres (DEFINITY) IV suspension  Antibiotics  :     Anti-infectives    Start     Dose/Rate Route Frequency Ordered Stop   08/05/15 2200  cefTRIAXone (ROCEPHIN) 1 g in dextrose 5 % 50 mL IVPB     1 g 100 mL/hr over 30 Minutes Intravenous Every 24 hours 08/04/15 2233 08/12/15 2159   08/04/15 2245  doxycycline (VIBRA-TABS) tablet 100 mg     100 mg Oral Every 12 hours 08/04/15 2233     08/04/15 1930  cefTRIAXone (ROCEPHIN) 1 g in dextrose 5 % 50 mL IVPB     1 g 100 mL/hr over 30 Minutes Intravenous  Once 08/04/15 1922 08/04/15 2111   08/04/15 1930  azithromycin (ZITHROMAX) 500 mg in dextrose 5 % 250 mL IVPB     500 mg 250 mL/hr over 60 Minutes Intravenous  Once 08/04/15 1922 08/04/15 2211        Objective:   Filed Vitals:   08/04/15 2215  08/04/15 2234 08/04/15 2236 08/05/15 0640  BP:   148/77 136/82  Pulse:   92 96  Temp: 98.7 F (37.1 C)  99.3 F (37.4 C) 99.2 F (37.3 C)  TempSrc: Oral  Oral Oral  Resp:   15 15  Height:  5' (1.524 m)    Weight:  60.2 kg (132 lb 11.5 oz)    SpO2:   97% 97%    Wt Readings from Last 3 Encounters:  08/04/15 60.2 kg (132 lb 11.5 oz)  07/07/15 62.71 kg (138 lb 4 oz)  01/10/15 65.681 kg (144 lb 12.8 oz)     Intake/Output Summary (Last 24 hours) at 08/05/15 1038 Last data filed at 08/05/15 0636  Gross per 24 hour  Intake  517.5 ml  Output      0 ml  Net  517.5 ml     Physical Exam  Awake Alert, Oriented X 3, No new F.N deficits, Normal affect [place].AT,PERRAL Supple Neck,No JVD, No cervical lymphadenopathy appriciated.  Symmetrical Chest wall movement, Good air movement bilaterally, CTAB RRR,No  Gallops,Rubs or new Murmurs, No Parasternal Heave +ve B.Sounds, Abd Soft, No tenderness, No organomegaly appriciated, No rebound - guarding or rigidity. No Cyanosis, Clubbing or edema, No new Rash or bruise     Data Review:   Micro Results Recent Results (from the past 240 hour(s))  Culture, Urine     Status: None (Preliminary result)   Collection Time: 08/04/15  8:25 PM  Result Value Ref Range Status   Specimen Description URINE, CLEAN CATCH  Final   Special Requests NONE  Final   Culture NO GROWTH < 12 HOURS  Final   Report Status PENDING  Incomplete    Radiology Reports Dg Chest 2 View  08/04/2015  CLINICAL DATA:  Progressive shortness of breath and productive cough. EXAM: CHEST  2 VIEW COMPARISON:  07/07/2015 FINDINGS: There is a faint peripheral infiltrate at the left lung base. There is prominent peribronchial thickening bilaterally. No effusions. There is tortuosity and calcification of the thoracic aorta. Heart size is at the upper limits are normal. No acute osseous abnormality. IMPRESSION: New faint peripheral infiltrate at the left lung base. Prominent bronchitic changes. Aortic atherosclerosis. Electronically Signed   By: Lorriane Shire M.D.   On: 08/04/2015 15:35   Dg Chest 2 View  07/07/2015  CLINICAL DATA:  Dyspnea, onset today. EXAM: CHEST  2 VIEW COMPARISON:  None FINDINGS: There is mild hyperinflation. The lungs are clear except for minimal chronic appearing interstitial coarsening. There is no pleural effusion. Pulmonary vasculature is normal. Heart size is normal. Mild aortic tortuosity. IMPRESSION: Hyperinflation and mild chronic appearing interstitial coarsening. Electronically Signed   By: Andreas Newport M.D.   On: 07/07/2015 02:12   Ct Angio Chest Pe W/cm &/or Wo Cm  08/04/2015  CLINICAL DATA:  Progressive shortness of breath for 1 week. Productive cough. EXAM: CT ANGIOGRAPHY CHEST WITH CONTRAST TECHNIQUE: Multidetector CT imaging of the chest was performed using  the standard protocol during bolus administration of intravenous contrast. Multiplanar CT image reconstructions and MIPs were obtained to evaluate the vascular anatomy. CONTRAST:  66m OMNIPAQUE IOHEXOL 350 MG/ML SOLN COMPARISON:  Chest radiograph earlier this day. FINDINGS: There are no filling defects within the pulmonary arteries to suggest pulmonary embolus. Atherosclerosis throughout the thoracic aorta, no aneurysm. There are coronary artery calcifications. Dense mitral annulus calcifications. No pericardial effusion. No mediastinal or hilar adenopathy. Severe bronchial thickening, most prominent in the lower lobes, left greater than right. Ill-defined linear  and patchy opacities in the lingula and left lower lobe. Ill-defined opacities in the posterior upper lobes, left greater than right, some of which appear nodular. Within the paramediastinal left lung apex with a 2.1 x 1.5 cm confluent opacity that abuts the left subclavian artery. Calcified granuloma in the right upper lobe, image 30. There is a small left pleural effusion. No definite right pleural effusion. Extensive atherosclerosis of the included upper abdominal vasculature. Calcified granuloma in the liver. Subcutaneous lipoma involving the left lateral lower chest wall, measures 6.9 x 3.4 cm. Small to moderate hiatal hernia. Implanted monitoring device in the anterior left chest wall. Advanced multilevel degenerative change throughout the thoracic spine. There are no acute or suspicious osseous abnormalities. Review of the MIP images confirms the above findings. IMPRESSION: 1. No pulmonary embolus. 2. Severe bronchial thickening with multifocal patchy, confluent, and linear opacities throughout all lobes both lungs, left greater than right. Findings are most significant in the left lung base involving the lingula and lower lobe. Suspect bronchitis with multifocal pneumonia, however some of these have a nodular appearance. The confluent left apical  opacity abuts the mediastinum, and attention to this at follow-up is recommended. Recommend follow-up chest CT in 3-4 months. 3. Severe atherosclerosis including the coronary arteries. Electronically Signed   By: Jeb Levering M.D.   On: 08/04/2015 20:49     CBC  Recent Labs Lab 08/04/15 1745 08/05/15 0730  WBC 16.6* 10.2  HGB 14.2 12.3  HCT 42.0 37.3  PLT 263 258  MCV 92.9 92.6  MCH 31.4 30.5  MCHC 33.8 33.0  RDW 15.0 15.0  LYMPHSABS  --  1.5  MONOABS  --  0.5  EOSABS  --  0.0  BASOSABS  --  0.0    Chemistries   Recent Labs Lab 08/04/15 1439 08/05/15 0730  NA 135 138  K 3.4* 3.8  CL 97* 100*  CO2 26 26  GLUCOSE 140* 89  BUN 18 11  CREATININE 0.78 0.70  CALCIUM 8.5* 7.9*  AST  --  28  ALT  --  18  ALKPHOS  --  63  BILITOT  --  0.8   ------------------------------------------------------------------------------------------------------------------ estimated creatinine clearance is 40.2 mL/min (by C-G formula based on Cr of 0.7). ------------------------------------------------------------------------------------------------------------------ No results for input(s): HGBA1C in the last 72 hours. ------------------------------------------------------------------------------------------------------------------ No results for input(s): CHOL, HDL, LDLCALC, TRIG, CHOLHDL, LDLDIRECT in the last 72 hours. ------------------------------------------------------------------------------------------------------------------ No results for input(s): TSH, T4TOTAL, T3FREE, THYROIDAB in the last 72 hours.  Invalid input(s): FREET3 ------------------------------------------------------------------------------------------------------------------ No results for input(s): VITAMINB12, FOLATE, FERRITIN, TIBC, IRON, RETICCTPCT in the last 72 hours.  Coagulation profile No results for input(s): INR, PROTIME in the last 168 hours.  No results for input(s): DDIMER in the last 72  hours.  Cardiac Enzymes  Recent Labs Lab 08/04/15 2044 08/05/15 0220 08/05/15 0730  TROPONINI 1.51* 1.36* 0.94*   ------------------------------------------------------------------------------------------------------------------ Invalid input(s): POCBNP   Time Spent in minutes   35   SINGH,PRASHANT K M.D on 08/05/2015 at 10:38 AM  Between 7am to 7pm - Pager - [phone]  After 7pm go to [email or web address] - password [facility]  [facility] -  Office  [phone]

## 2015-08-05 NOTE — Care Management Note (Addendum)
Case Management Note  Patient Details  Name: Debbie Kramer MRN: [id number] Date of Birth: [birth year]  Subjective/Objective:  Date: 08/05/15 Spoke with patient at the bedside along with daughter, Debbie Kramer.  Introduced self as Tourist information centre manager and explained role in discharge planning and how to be reached.  Verified patient lives in town, alone, but will have both daughters who are cna's helping her at dc, they will be switching off when the other goes to work. Has DME cane, rolling walker and a manuel wheelchair and handicapped commode with handrails in bath tub.  Expressed potential need for no other DME.  Verified patient anticipates to go home with family, at time of discharge and will have full-time supervision by family per daughter Debbie Kramer she will make these arrangements if necessary at this time to best of their knowledge.  Patient  denied needing help with their medication.  Patient is driven by daughters to MD appointments.  Verified patient has PCP Debbie Kramer.   Plan: CM will continue to follow for discharge planning and [facility] resources.                   Action/Plan:   Expected Discharge Date:                  Expected Discharge Plan:  [place]  In-House Referral:     Discharge planning Services  CM Consult  Post Acute Care Choice:    Choice offered to:     DME Arranged:    DME Agency:     HH Arranged:    [place] Agency:     Status of Service:  In process, will continue to follow  Medicare Important Message Given:    Date Medicare IM Given:    Medicare IM give by:    Date Additional Medicare IM Given:    Additional Medicare Important Message give by:     If discussed at [place] of Stay Meetings, dates discussed:    Additional Comments:  Zenon Mayo, RN 08/05/2015, 12:26 PM

## 2015-08-05 NOTE — Progress Notes (Signed)
PATIENT ID: [id number] with CAD (on cardiac CT), hypertension, COPD and RA here with pneumonia and elevated troponin of unclear significance.  INTERVAL HISTORY: Ms. Buchan is doing well on IV antibiotics. She had no events overnight.  SUBJECTIVE:  Feeling well. Breathing improved from yesterday. She continues to deny chest pain or pressure.   PHYSICAL EXAM Filed Vitals:   08/04/15 2215 08/04/15 2234 08/04/15 2236 08/05/15 0640  BP:   148/77 136/82  Pulse:   92 96  Temp: 98.7 F (37.1 C)  99.3 F (37.4 C) 99.2 F (37.3 C)  TempSrc: Oral  Oral Oral  Resp:   15 15  Height:  5' (1.524 m)    Weight:  60.2 kg (132 lb 11.5 oz)    SpO2:   97% 97%   General:  Well appearing elderly woman in no acute distress. Sitting upright in bed with supplemental oxygen. Neck: No JVD. Lungs:  Rhonchi in the right upper and middle lung fields. Scattered wheezes throughout. Heart:  Regular rate and rhythm. 2/6 systolic murmur at the apex. No rubs or gallops. Normal S1/S2 Abdomen:  Soft, nontender, nondistended. Active bowel sounds Extremities:  Warm and well-perfused. No edema.  LABS: Lab Results  Component Value Date   TROPONINI 1.36* 08/05/2015   Results for orders placed or performed during the hospital encounter of 08/04/15 (from the past 24 hour(s))  Basic metabolic panel     Status: Abnormal   Collection Time: 08/04/15  2:39 PM  Result Value Ref Range   Sodium 135 135 - 145 mmol/L   Potassium 3.4 (L) 3.5 - 5.1 mmol/L   Chloride 97 (L) 101 - 111 mmol/L   CO2 26 22 - 32 mmol/L   Glucose, Bld 140 (H) 65 - 99 mg/dL   BUN 18 6 - 20 mg/dL   Creatinine, Ser 0.78 0.44 - 1.00 mg/dL   Calcium 8.5 (L) 8.9 - 10.3 mg/dL   GFR calc non Af Amer >60 >60 mL/min   GFR calc Af Amer >60 >60 mL/min   Anion gap 12 5 - 15  Troponin I     Status: Abnormal   Collection Time: 08/04/15  2:39 PM  Result Value Ref Range   Troponin I 1.43 (HH) <0.031 ng/mL  Brain natriuretic peptide     Status: Abnormal   Collection Time: 08/04/15  2:39 PM  Result Value Ref Range   B Natriuretic Peptide 641.8 (H) 0.0 - 100.0 pg/mL  I-stat troponin, ED (not at [facility], [facility])     Status: Abnormal   Collection Time: 08/04/15  2:47 PM  Result Value Ref Range   Troponin i, poc 1.39 (HH) 0.00 - 0.08 ng/mL   Comment NOTIFIED PHYSICIAN    Comment 3          CBC     Status: Abnormal   Collection Time: 08/04/15  5:45 PM  Result Value Ref Range   WBC 16.6 (H) 4.0 - 10.5 K/uL   RBC 4.52 3.87 - 5.11 MIL/uL   Hemoglobin 14.2 12.0 - 15.0 g/dL   HCT 42.0 36.0 - 46.0 %   MCV 92.9 78.0 - 100.0 fL   MCH 31.4 26.0 - 34.0 pg   MCHC 33.8 30.0 - 36.0 g/dL   RDW 15.0 11.5 - 15.5 %   Platelets 263 150 - 400 K/uL  Urinalysis, Routine w reflex microscopic (not at [facility])     Status: Abnormal   Collection Time: 08/04/15  8:25 PM  Result Value Ref Range  Color, Urine AMBER (A) YELLOW   APPearance CLOUDY (A) CLEAR   Specific Gravity, Urine 1.025 1.005 - 1.030   pH 6.0 5.0 - 8.0   Glucose, UA NEGATIVE NEGATIVE mg/dL   Hgb urine dipstick NEGATIVE NEGATIVE   Bilirubin Urine SMALL (A) NEGATIVE   Ketones, ur 15 (A) NEGATIVE mg/dL   Protein, ur NEGATIVE NEGATIVE mg/dL   Nitrite NEGATIVE NEGATIVE   Leukocytes, UA NEGATIVE NEGATIVE  Troponin I (q 6hr x 3)     Status: Abnormal   Collection Time: 08/04/15  8:44 PM  Result Value Ref Range   Troponin I 1.51 (HH) <0.031 ng/mL  Troponin I (q 6hr x 3)     Status: Abnormal   Collection Time: 08/05/15  2:20 AM  Result Value Ref Range   Troponin I 1.36 (HH) <0.031 ng/mL  Strep pneumoniae urinary antigen     Status: None   Collection Time: 08/05/15  2:30 AM  Result Value Ref Range   Strep Pneumo Urinary Antigen NEGATIVE NEGATIVE    Intake/Output Summary (Last 24 hours) at 08/05/15 6283 Last data filed at 08/05/15 0636  Gross per 24 hour  Intake  517.5 ml  Output      0 ml  Net  517.5 ml    EKG:  Sinus rhythm rate 93 bpm.  Prior inferior and anteroseptal infarcts.  Poor R wave  progression.  ASSESSMENT AND PLAN:  Active Problems:   Essential hypertension   Tobacco abuse   CAP (community acquired pneumonia)   Elevated troponin   Abnormal ECG   Prolonged QT interval   # CAD, demand ischemia:  Troponin peaked at 1.51.  EKG shows evidence of prior MI and atherosclerosis was seen on CT.  Her EKG is unchanged from 07/2015.  She continues to deny chest pain. It is likely that her troponin is elevated in the setting of demand ischemia due to her pneumonia. Echo is pending. We will plan for medical management and less she develops symptoms or has significant findings on her echo.  She should be on optimal medical therapy given that she has coronary disease on her CT scan and evidence of prior MIs on her EKG.  - She will need cardiology follow-up for consideration of ischemia evaluation.   - Continue aspirin 325 mg due to a history of prior stroke.   - Start metoprolol 12.5 mg every 12 hours. Benazepril can be reduced if necessary.  - Continue simvastatin 10 mg daily.  # Hypertensive heart disease:  Blood pressure well-controlled. However, given her coronary disease, adding a beta blocker would be ideal. We will start metoprolol as above. Continue benazepril for now.  # Heart failure, unknown type: BNP was elevated to 641.  She does not appear to be volume overloaded.  Echo pending.  She is currently getting NS at 75 ml/hr.  Continue to monitor and consider stopping if po intake improves.  Benazepril and metoprolol as above.  # Hyperlipidemia: Ms. Degraffenreid is on simvastatin 10 mg.  LDL was 77 10/2014.  Layce Sprung C. Oval Linsey, MD, [facility] 08/05/2015 8:22 AM

## 2015-08-05 NOTE — Progress Notes (Signed)
Initial Nutrition Assessment  DOCUMENTATION CODES:   Not applicable  INTERVENTION:   -D/c Ensure Enlive po BID, each supplement provides 350 kcal and 20 grams of protein, due to poor acceptance -Boost Breeze po TID, each supplement provides 250 kcal and 9 grams of protein  NUTRITION DIAGNOSIS:   Predicted suboptimal nutrient intake related to poor appetite as evidenced by per patient/family report.  GOAL:   Patient will meet greater than or equal to 90% of their needs  MONITOR:   PO intake, Supplement acceptance, Labs, Weight trends, Skin, I & O's  REASON FOR ASSESSMENT:   Malnutrition Screening Tool    ASSESSMENT:   82 year old female history of CVA here with possible CAP elevated troponin and abnormal ECG cardiology is aware, will cover for CAP and continue lovenox  Pt admitted with CAP.   Pt alone at time of visit with no family to provide additional hx. Attempted to examine pt x 2, however, pt was undergoing EKG at times of visits.   Staff reports poor po intake. Noted pt has been refusing Ensure supplement per [facility]; RD will discontinue due to poor acceptance.   SLP evaluated on 08/05/15; reviewed note, which revealed moderate to severe aspiration risk. SLP recommends continuing with regular consistency diet with thin liquids, however, MBSS has been ordered for 08/05/14.   Wt hx reviewed. UBW 140#. Noted a 4.3% wt loss x 1 months.  Cariology evaluated on 08/03/14 due to elevated troponin; per note, likely related to demand ischemia.   Labs reviewed.   Diet Order:  Diet Heart Room service appropriate?: Yes; Fluid consistency:: Thin  Skin:  Reviewed, no issues  Last BM:  08/04/15  Height:   Ht Readings from Last 1 Encounters:  08/04/15 5' (1.524 m)    Weight:   Wt Readings from Last 1 Encounters:  08/04/15 132 lb 11.5 oz (60.2 kg)    Ideal Body Weight:  45.5 kg  BMI:  Body mass index is 25.92 kg/(m^2).  Estimated Nutritional Needs:   Kcal:   1300-1500  Protein:  60-70 grams  Fluid:  1.3-1.5 L  EDUCATION NEEDS:   No education needs identified at this time  Aideen Fenster A. Jimmye Norman, RD, LDN, CDE Pager: [phone] After hours Pager: [phone]

## 2015-08-05 NOTE — Evaluation (Signed)
Clinical/Bedside Swallow Evaluation Patient Details  Name: Debbie Kramer MRN: [id number] Date of Birth: Oct 04, 1925  Today's Date: 08/05/2015 Time: SLP Start Time (ACUTE ONLY): 1140 SLP Stop Time (ACUTE ONLY): 1155 SLP Time Calculation (min) (ACUTE ONLY): 15 min  Past Medical History:  Past Medical History  Diagnosis Date  . Rheumatoid arthritis(714.0)   . Hypertension   . Degenerative disc disease   . Bronchitis   . COPD (chronic obstructive pulmonary disease) ([place])    Past Surgical History:  Past Surgical History  Procedure Laterality Date  . Hernia repair      umbilical  . Knee surgery Bilateral 1996, 1997    replacements  . Cholecystectomy      lap choley  . Loop recorder implant N/A 10/25/2014    Procedure: LOOP RECORDER IMPLANT;  Surgeon: Deboraha Sprang, MD;  Location: [facility] CATH LAB;  Service: Cardiovascular;  Laterality: N/A;   HPI:  Pt is a 82 y.o. female with past medical history of hiatal hernia, Rheumatoid arthritis, tobacco abuse, Hypertension; CVA 10/2014, Degenerative disc disease; environmental allergies; Bronchitis; and COPD admitted with 1 month progressive shortness of breath and some coughing. Chest x-ray showed new faint peripheral infiltrate left lung base. BSE 10/24/14 recommended regular and thin, coughing pt states is baseline, pt/family opted to initiate po's versus MBS.    Assessment / Plan / Recommendation Clinical Impression  Pt and daughter deny a pattern/relationship between pt's cough and po consumption at home. Suspect laryngeal penetration/aspitation following sips thin water with and without straw. Given clinical observations, new pna, history of COPD, hx CVA and BSE recommending MBS 10/2014, MBS is warranted and discussed with pt/daughter who are in agreement. MBS will be scheduled for tomorrow am. Continue regular texture, thin liquids, until MBS next date. Educated pt SN:[id number] strategies.        Aspiration Risk   (mod-severe)    Diet  Recommendation Regular;Thin liquid   Liquid Administration via: Cup Medication Administration: Whole meds with liquid Supervision: Patient able to self feed;Intermittent supervision to cue for compensatory strategies Compensations: Slow rate;Small sips/bites Postural Changes: Seated upright at 90 degrees    Other  Recommendations Oral Care Recommendations: Oral care BID   Follow up Recommendations   (TBD)    Frequency and Duration            Prognosis        Swallow Study   General HPI: Pt is a 82 y.o. female with past medical history of hiatal hernia, Rheumatoid arthritis, tobacco abuse, Hypertension; CVA 10/2014, Degenerative disc disease; environmental allergies; Bronchitis; and COPD admitted with 1 month progressive shortness of breath and some coughing. Chest x-ray showed new faint peripheral infiltrate left lung base. BSE 10/24/14 recommended regular and thin, coughing pt states is baseline, pt/family opted to initiate po's versus MBS.  Type of Study: Bedside Swallow Evaluation Previous Swallow Assessment:  (see HPI) Diet Prior to this Study: Regular;Thin liquids Temperature Spikes Noted: Yes Respiratory Status: Room air History of Recent Intubation: No Behavior/Cognition: Alert;Cooperative;Pleasant mood Oral Cavity Assessment: Within Functional Limits Oral Care Completed by SLP: No Oral Cavity - Dentition: Dentures, top;Dentures, bottom Vision: Functional for self-feeding Self-Feeding Abilities: Able to feed self Patient Positioning: Upright in bed Baseline Vocal Quality: Normal Volitional Cough: Strong Volitional Swallow: Able to elicit    Oral/Motor/Sensory Function Overall Oral Motor/Sensory Function:  (baseline right decr ROM)   Ice Chips Ice chips: Not tested   Thin Liquid Thin Liquid: Impaired Presentation: Cup;Straw Oral Phase Impairments:  (none) Pharyngeal  Phase Impairments: Cough - Delayed    Nectar Thick Nectar Thick Liquid: Not tested   Honey Thick  Honey Thick Liquid: Not tested   Puree Puree: Not tested   Solid   GO    Solid: Not tested       Houston Siren 08/05/2015,12:10 PM   Orbie Pyo Colvin Caroli.Ed [employer] [phone]

## 2015-08-06 ENCOUNTER — Inpatient Hospital Stay (HOSPITAL_COMMUNITY): Payer: Medicare Other

## 2015-08-06 DIAGNOSIS — I255 Ischemic cardiomyopathy: Secondary | ICD-10-CM

## 2015-08-06 LAB — LEGIONELLA ANTIGEN, URINE

## 2015-08-06 LAB — BASIC METABOLIC PANEL
Anion gap: 10 (ref 5–15)
BUN: 9 mg/dL (ref 6–20)
CO2: 24 mmol/L (ref 22–32)
CREATININE: 0.49 mg/dL (ref 0.44–1.00)
Calcium: 8.1 mg/dL — ABNORMAL LOW (ref 8.9–10.3)
Chloride: 103 mmol/L (ref 101–111)
GFR calc Af Amer: >60 mL/min (ref >60)
GLUCOSE: 81 mg/dL (ref 65–99)
Potassium: 3.9 mmol/L (ref 3.5–5.1)
SODIUM: 137 mmol/L (ref 135–145)

## 2015-08-06 LAB — CBC
HCT: 39.7 % (ref 36.0–46.0)
Hemoglobin: 13.4 g/dL (ref 12.0–15.0)
MCH: 31.1 pg (ref 26.0–34.0)
MCHC: 33.8 g/dL (ref 30.0–36.0)
MCV: 92.1 fL (ref 78.0–100.0)
PLATELETS: 291 10*3/uL (ref 150–400)
RBC: 4.31 MIL/uL (ref 3.87–5.11)
RDW: 14.8 % (ref 11.5–15.5)
WBC: 8.7 10*3/uL (ref 4.0–10.5)

## 2015-08-06 LAB — URINE CULTURE

## 2015-08-06 LAB — PHOSPHORUS: Phosphorus: 2.9 mg/dL (ref 2.5–4.6)

## 2015-08-06 LAB — MAGNESIUM: MAGNESIUM: 2 mg/dL (ref 1.7–2.4)

## 2015-08-06 LAB — TSH: TSH: 1.412 u[IU]/mL (ref 0.350–4.500)

## 2015-08-06 MED ORDER — FUROSEMIDE 10 MG/ML IJ SOLN
20.0000 mg | Freq: Once | INTRAMUSCULAR | Status: AC
  Administered 2015-08-06: 20 mg via INTRAVENOUS
  Filled 2015-08-06: qty 2

## 2015-08-06 NOTE — Progress Notes (Addendum)
Subjective: A little tired after walking.  No CP.    Objective: Vital signs in last 24 hours: Temp:  [97.7 F (36.5 C)-99.1 F (37.3 C)] 97.7 F (36.5 C) (01/03 1013) Pulse Rate:  [72-94] 80 (01/03 1013) Resp:  [16-22] 22 (01/03 1013) BP: (132-153)/(69-83) 147/79 mmHg (01/03 1013) SpO2:  [94 %-98 %] 98 % (01/03 1013) Last BM Date: 08/05/15  Intake/Output from previous day: 01/02 0701 - 01/03 0700 In: 290.8 [I.V.:290.8] Out: 1200 [Urine:1200] Intake/Output this shift:    Medications Scheduled Meds: . aspirin  325 mg Oral Daily  . benazepril  10 mg Oral Daily  . cefTRIAXone (ROCEPHIN)  IV  1 g Intravenous Q24H  . doxycycline  100 mg Oral Q12H  . enoxaparin (LOVENOX) injection  60 mg Subcutaneous Q12H  . feeding supplement  1 Container Oral TID BM  . Fluticasone Furoate-Vilanterol  1 puff Inhalation Daily  . HYDROcodone-acetaminophen  1 tablet Oral BID  . metoprolol tartrate  50 mg Oral BID  . simvastatin  10 mg Oral q1800   Continuous Infusions:  PRN Meds:.  PE: General appearance: alert, cooperative and no distress Lungs: Decreased BS more on the left with some mild wheeze. Heart: regular rate and rhythm, S1, S2 normal, no murmur, click, rub or gallop Extremities: No LEE Pulses: 2+ and symmetric Skin: Warm and dry Neurologic: Grossly normal  Lab Results:   Recent Labs  08/04/15 1745 08/05/15 0730 08/06/15 0501  WBC 16.6* 10.2 8.7  HGB 14.2 12.3 13.4  HCT 42.0 37.3 39.7  PLT 263 258 291   BMET  Recent Labs  08/04/15 1439 08/05/15 0730 08/06/15 0501  NA 135 138 137  K 3.4* 3.8 3.9  CL 97* 100* 103  CO2 '26 26 24  '$ GLUCOSE 140* 89 81  BUN '18 11 9  '$ CREATININE 0.78 0.70 0.49  CALCIUM 8.5* 7.9* 8.1*    Assessment/Plan    CAD, demand ischemia:  Troponin peaked at 1.51. EKG shows evidence of prior MI and atherosclerosis was seen on CT. Her EKG is unchanged from 07/2015.  It is likely that her troponin is elevated in the setting of demand  ischemia due to her pneumonia. Echo 08/05/15: EF 40-45%, akinesis of the mid-apicalanteroseptal, anterior, inferior, and apical myocardium. Suggestive of Takotsubo cardiomyopathy or ischemic etiology. G1DD. Mild MR.  Mild to mod LVH.   She ambulated in the hall prior to me seeing her and she did well.  A little tired.  She is normally very active and does her own shopping, etc...  She is on good medical therapy.  There is evidence of coronary disease on her CT scan and evidence of prior MIs on her EKG.   - Continue aspirin 325 mg due to a history of prior stroke.  metoprolol 50 mg every 12 hours. Benazepril 10.  - Continue simvastatin 10 mg daily.  # Hypertensive heart disease: Blood pressure well-controlled. Continue benazepril for now with cardiomyopathy.  # Combined Systolic and diastolic heart failure: BNP was elevated to 641. Appears euvolemic.  Benazepril and metoprolol as above.  DAily weight monitoring and low salt diet discussed.    # Hyperlipidemia: Ms. Chauca is on simvastatin 10 mg. LDL was 77 10/2014.   LOS: 2 days    HAGER, BRYAN PA-C 08/06/2015    10:29 AM  History and all data above reviewed.  Patient examined.  I agree with the findings as above.  Still with cough and SOB.   The patient exam reveals COR:RRR  ,  Lungs: Decreased breath sounds  ,  Abd: Positive bowel sounds, no rebound no guarding, Ext No edema  .  All available labs, radiology testing, previous records reviewed. Agree with documented assessment and plan. Cardiomyopathy probably ischemic:  I will plan a non invasive study but probably as an outpatient.    Jeneen Rinks Joban Colledge  11:06 AM  08/06/2015

## 2015-08-06 NOTE — Consult Note (Signed)
   [facility] CM Inpatient Consult   08/06/2015  Debbie Kramer [birth year] [id number] Notification from inpatient RNCM of the patient initiated for the Pneumonia EMMI program with [facility]. Thanks for the referral. Natividad Brood, RN BSN [facility] Liaison  [phone] business mobile phone

## 2015-08-06 NOTE — Evaluation (Signed)
Physical Therapy Evaluation Patient Details Name: WINDI TORO MRN: [id number] DOB: 09-Feb-1926 Today's Date: 08/06/2015   History of Present Illness  82 y/o female with PMH of HTN, RA, ongoing tobacco abuse with 1 months history of progressive shortness of breath and some coughing. Lately productive of yellow mucus. worse shortness of breath with laying down. Attempted to use inhalers prescribed by her physician with little response.  Clinical Impression  Pt admitted with above diagnosis. Pt currently with functional limitations due to the deficits listed below (see PT Problem List).  Pt will benefit from skilled PT to increase their independence and safety with mobility to allow discharge to the venue listed below.  Pt o2 sats 93% on RA with ambulation with c/o weakness.  Recommend HHPT at time of d/c.     Follow Up Recommendations Home health PT    Equipment Recommendations  None recommended by PT    Recommendations for Other Services       Precautions / Restrictions Precautions Precautions: Fall Restrictions Weight Bearing Restrictions: No      Mobility  Bed Mobility Overal bed mobility: Modified Independent             General bed mobility comments: HOB elevated  Transfers Overall transfer level: Needs assistance Equipment used: Straight cane Transfers: Sit to/from Stand Sit to Stand: Min guard         General transfer comment: stood from bed and low toilet with min/guard and use of grab bar at toilet  Ambulation/Gait Ambulation/Gait assistance: Min guard Ambulation Distance (Feet): 90 Feet Assistive device: Straight cane Gait Pattern/deviations: Step-through pattern;Trunk flexed     General Gait Details: Pt amb on RA with o2 sat 93%.  Pt said she felt more limited by weakness in her legs than gait, but did have 2/4 dyspnea with gait.  Stairs            Wheelchair Mobility    Modified Rankin (Stroke Patients Only)       Balance Overall  balance assessment: Needs assistance   Sitting balance-Leahy Scale: Good       Standing balance-Leahy Scale: Fair Standing balance comment: Able to ambulate short distance in room without cane                             Pertinent Vitals/Pain Pain Assessment: No/denies pain    Home Living Family/patient expects to be discharged to:: Private residence Living Arrangements: Children Available Help at Discharge: Family;Available 24 hours/day Type of Home: House Home Access: Stairs to enter Entrance Stairs-Rails: Right;Left;Can reach both Entrance Stairs-Number of Steps: 2 Home Layout: One level Home Equipment: Cane - single point;Walker - 2 wheels;Grab bars - toilet;Grab bars - tub/shower;Shower seat      Prior Function Level of Independence: Independent with assistive device(s)         Comments: amb with cane     Hand Dominance   Dominant Hand: Right    Extremity/Trunk Assessment   Upper Extremity Assessment: Overall WFL for tasks assessed           Lower Extremity Assessment: Overall WFL for tasks assessed;Generalized weakness         Communication   Communication: No difficulties  Cognition Arousal/Alertness: Awake/alert Behavior During Therapy: WFL for tasks assessed/performed Overall Cognitive Status: Within Functional Limits for tasks assessed                      General  Comments General comments (skin integrity, edema, etc.): Daughter present    Exercises        Assessment/Plan    PT Assessment Patient needs continued PT services  PT Diagnosis Generalized weakness   PT Problem List Decreased strength;Decreased activity tolerance;Decreased balance;Decreased mobility  PT Treatment Interventions Gait training;Stair training;Functional mobility training;Therapeutic activities;Therapeutic exercise;Balance training   PT Goals (Current goals can be found in the Care Plan section) Acute Rehab PT Goals Patient Stated Goal: get  the phlegm up PT Goal Formulation: With patient Time For Goal Achievement: 08/13/15 Potential to Achieve Goals: Good    Frequency Min 3X/week   Barriers to discharge        Co-evaluation               End of Session Equipment Utilized During Treatment: Gait belt Activity Tolerance: Patient tolerated treatment well Patient left: in bed;with call bell/phone within reach;with family/visitor present;Other (comment) (transport present to take to xray) Nurse Communication: Mobility status         Time: 3912-2583 PT Time Calculation (min) (ACUTE ONLY): 25 min   Charges:   PT Evaluation $PT Eval Low Complexity: 1 Procedure PT Treatments $Gait Training: 8-22 mins   PT G Codes:        Gregory Barrick LUBECK 08/06/2015, 11:29 AM

## 2015-08-06 NOTE — Progress Notes (Signed)
SATURATION QUALIFICATIONS: (This note is used to comply with regulatory documentation for home oxygen)  Patient Saturations on Room Air at Rest = 96%  Patient Saturations on Room Air while Ambulating = 93%  Santiago Glad L. Tamala Julian, [place] Pager [phone] 08/06/2015

## 2015-08-06 NOTE — Progress Notes (Signed)
Patient Demographics:    Debbie Kramer, is a 82 y.o. female, DOB - 23-Sep-1925, ZOX:[id number]  Admit date - 08/04/2015   Admitting Physician Toy Baker, MD  Outpatient Primary MD for the patient is Haywood Pao, MD  LOS - 2   Chief Complaint  Patient presents with  . Shortness of Breath        Subjective:    Debbie Kramer today has, No headache, No chest pain, No abdominal pain - No Nausea, No new weakness tingling or numbness, approved cough and shortness of breath.   Assessment  & Plan :     1. CAP. Noted CT scan, she already feels better on empiric IV antibiotics which will be continued, continue supportive care with oxygen and nebulizer treatments, monitor cultures.  2. NSTEMI. Chr.Systolic.CHF EF 45% - Cardiology on board, chest pain-free, EKG nonacute, on aspirin, ACE,  beta blocker and statin for secondary prevention along with Lovenox. Echogram noted. Will monitor. Further Cardiac management deferred to Cards.  3. Essential hypertension. Continue ACE inhibitor, beta blocker, have increased beta blocker dose and dropped ACE inhibitor for better heart rate control.  4. Dyslipidemia. On statin continue.  5. Mediastinal fullness noted on CT scan chest. Needs repeat CT scan of the chest in 3 months.    Code Status : Full  Family Communication  : None present  Disposition Plan  : Likely SNF in 2-3 days  Consults  :  Cards  Procedures  :   CT chest - confirms pneumonia, mediastinal fullness needs repeat CT scan in 3 months.  Echogram    Mild to moderate LVH with LVEF approximately 40-45% and largearea of mid to apical periapical akinesis suggestive of possibleTakotsubo cardiomyopathy (ischemic cardiomyopathy not excluded). LV dysfunction is new compared to the prior  study in March 2016. Prominent LV apical trabeculation without definitive thrombus by Definity contrast imaging. Grade 1 diastolic dysfunction. Mild left atrial enlargement. Mild mitral regurgitation. MIld aortic stenosis. Mild tricuspid regurgitation with PASP 43 mmHg.    DVT Prophylaxis  :  Lovenox    Lab Results  Component Value Date   PLT 291 08/06/2015    Inpatient Medications  Scheduled Meds: . aspirin  325 mg Oral Daily  . benazepril  10 mg Oral Daily  . cefTRIAXone (ROCEPHIN)  IV  1 g Intravenous Q24H  . doxycycline  100 mg Oral Q12H  . enoxaparin (LOVENOX) injection  60 mg Subcutaneous Q12H  . feeding supplement  1 Container Oral TID BM  . Fluticasone Furoate-Vilanterol  1 puff Inhalation Daily  . furosemide  20 mg Intravenous Once  . HYDROcodone-acetaminophen  1 tablet Oral BID  . metoprolol tartrate  50 mg Oral BID  . simvastatin  10 mg Oral q1800   Continuous Infusions:  PRN Meds:.  Antibiotics  :     Anti-infectives    Start     Dose/Rate Route Frequency Ordered Stop   08/05/15 2200  cefTRIAXone (ROCEPHIN) 1 g in dextrose 5 % 50 mL IVPB     1 g 100 mL/hr over 30 Minutes Intravenous Every 24 hours 08/04/15 2233 08/12/15 2159   08/04/15 2245  doxycycline (VIBRA-TABS) tablet 100 mg     100 mg Oral Every 12 hours 08/04/15 2233  08/04/15 1930  cefTRIAXone (ROCEPHIN) 1 g in dextrose 5 % 50 mL IVPB     1 g 100 mL/hr over 30 Minutes Intravenous  Once 08/04/15 1922 08/04/15 2111   08/04/15 1930  azithromycin (ZITHROMAX) 500 mg in dextrose 5 % 250 mL IVPB     500 mg 250 mL/hr over 60 Minutes Intravenous  Once 08/04/15 1922 08/04/15 2211        Objective:   Filed Vitals:   08/05/15 1851 08/05/15 2017 08/06/15 0013 08/06/15 0437  BP: 132/76 149/83 153/69 146/78  Pulse: 94 85 72 75  Temp: 98.9 F (37.2 C) 97.7 F (36.5 C) 98.1 F (36.7 C) 98.3 F (36.8 C)  TempSrc: Oral Oral Oral Oral  Resp: '18 18 18 18  '$ Height:      Weight:      SpO2: 97% 95% 95%  94%    Wt Readings from Last 3 Encounters:  08/04/15 60.2 kg (132 lb 11.5 oz)  07/07/15 62.71 kg (138 lb 4 oz)  01/10/15 65.681 kg (144 lb 12.8 oz)     Intake/Output Summary (Last 24 hours) at 08/06/15 0846 Last data filed at 08/06/15 0434  Gross per 24 hour  Intake 290.83 ml  Output   1200 ml  Net -909.17 ml     Physical Exam  Awake Alert, Oriented X 3, No new F.N deficits, Normal affect [place].AT,PERRAL Supple Neck,No JVD, No cervical lymphadenopathy appriciated.  Symmetrical Chest wall movement, Good air movement bilaterally, Coarse B sounds RRR,No Gallops,Rubs or new Murmurs, No Parasternal Heave +ve B.Sounds, Abd Soft, No tenderness, No organomegaly appriciated, No rebound - guarding or rigidity. No Cyanosis, Clubbing or edema, No new Rash or bruise     Data Review:   Micro Results Recent Results (from the past 240 hour(s))  Culture, Urine     Status: None (Preliminary result)   Collection Time: 08/04/15  8:25 PM  Result Value Ref Range Status   Specimen Description URINE, CLEAN CATCH  Final   Special Requests NONE  Final   Culture TOO YOUNG TO READ  Final   Report Status PENDING  Incomplete    Radiology Reports Dg Chest 2 View  08/04/2015  CLINICAL DATA:  Progressive shortness of breath and productive cough. EXAM: CHEST  2 VIEW COMPARISON:  07/07/2015 FINDINGS: There is a faint peripheral infiltrate at the left lung base. There is prominent peribronchial thickening bilaterally. No effusions. There is tortuosity and calcification of the thoracic aorta. Heart size is at the upper limits are normal. No acute osseous abnormality. IMPRESSION: New faint peripheral infiltrate at the left lung base. Prominent bronchitic changes. Aortic atherosclerosis. Electronically Signed   By: Lorriane Shire M.D.   On: 08/04/2015 15:35   Ct Angio Chest Pe W/cm &/or Wo Cm  08/04/2015  CLINICAL DATA:  Progressive shortness of breath for 1 week. Productive cough. EXAM: CT ANGIOGRAPHY CHEST  WITH CONTRAST TECHNIQUE: Multidetector CT imaging of the chest was performed using the standard protocol during bolus administration of intravenous contrast. Multiplanar CT image reconstructions and MIPs were obtained to evaluate the vascular anatomy. CONTRAST:  80m OMNIPAQUE IOHEXOL 350 MG/ML SOLN COMPARISON:  Chest radiograph earlier this day. FINDINGS: There are no filling defects within the pulmonary arteries to suggest pulmonary embolus. Atherosclerosis throughout the thoracic aorta, no aneurysm. There are coronary artery calcifications. Dense mitral annulus calcifications. No pericardial effusion. No mediastinal or hilar adenopathy. Severe bronchial thickening, most prominent in the lower lobes, left greater than right. Ill-defined linear and patchy opacities  in the lingula and left lower lobe. Ill-defined opacities in the posterior upper lobes, left greater than right, some of which appear nodular. Within the paramediastinal left lung apex with a 2.1 x 1.5 cm confluent opacity that abuts the left subclavian artery. Calcified granuloma in the right upper lobe, image 30. There is a small left pleural effusion. No definite right pleural effusion. Extensive atherosclerosis of the included upper abdominal vasculature. Calcified granuloma in the liver. Subcutaneous lipoma involving the left lateral lower chest wall, measures 6.9 x 3.4 cm. Small to moderate hiatal hernia. Implanted monitoring device in the anterior left chest wall. Advanced multilevel degenerative change throughout the thoracic spine. There are no acute or suspicious osseous abnormalities. Review of the MIP images confirms the above findings. IMPRESSION: 1. No pulmonary embolus. 2. Severe bronchial thickening with multifocal patchy, confluent, and linear opacities throughout all lobes both lungs, left greater than right. Findings are most significant in the left lung base involving the lingula and lower lobe. Suspect bronchitis with multifocal  pneumonia, however some of these have a nodular appearance. The confluent left apical opacity abuts the mediastinum, and attention to this at follow-up is recommended. Recommend follow-up chest CT in 3-4 months. 3. Severe atherosclerosis including the coronary arteries. Electronically Signed   By: Jeb Levering M.D.   On: 08/04/2015 20:49     CBC  Recent Labs Lab 08/04/15 1745 08/05/15 0730 08/06/15 0501  WBC 16.6* 10.2 8.7  HGB 14.2 12.3 13.4  HCT 42.0 37.3 39.7  PLT 263 258 291  MCV 92.9 92.6 92.1  MCH 31.4 30.5 31.1  MCHC 33.8 33.0 33.8  RDW 15.0 15.0 14.8  LYMPHSABS  --  1.5  --   MONOABS  --  0.5  --   EOSABS  --  0.0  --   BASOSABS  --  0.0  --     Chemistries   Recent Labs Lab 08/04/15 1439 08/05/15 0730 08/06/15 0501  NA 135 138 137  K 3.4* 3.8 3.9  CL 97* 100* 103  CO2 '26 26 24  '$ GLUCOSE 140* 89 81  BUN '18 11 9  '$ CREATININE 0.78 0.70 0.49  CALCIUM 8.5* 7.9* 8.1*  MG  --   --  2.0  AST  --  28  --   ALT  --  18  --   ALKPHOS  --  63  --   BILITOT  --  0.8  --    ------------------------------------------------------------------------------------------------------------------ estimated creatinine clearance is 40.2 mL/min (by C-G formula based on Cr of 0.49). ------------------------------------------------------------------------------------------------------------------ No results for input(s): HGBA1C in the last 72 hours. ------------------------------------------------------------------------------------------------------------------ No results for input(s): CHOL, HDL, LDLCALC, TRIG, CHOLHDL, LDLDIRECT in the last 72 hours. ------------------------------------------------------------------------------------------------------------------  Recent Labs  08/06/15 0501  TSH 1.412   ------------------------------------------------------------------------------------------------------------------ No results for input(s): VITAMINB12, FOLATE, FERRITIN,  TIBC, IRON, RETICCTPCT in the last 72 hours.  Coagulation profile No results for input(s): INR, PROTIME in the last 168 hours.  No results for input(s): DDIMER in the last 72 hours.  Cardiac Enzymes  Recent Labs Lab 08/04/15 2044 08/05/15 0220 08/05/15 0730  TROPONINI 1.51* 1.36* 0.94*   ------------------------------------------------------------------------------------------------------------------ Invalid input(s): POCBNP   Time Spent in minutes   35   Dameisha Tschida K M.D on 08/06/2015 at 8:46 AM  Between 7am to 7pm - Pager - [phone]  After 7pm go to [email or web address] - password [facility]  [facility] -  Office  [phone]

## 2015-08-06 NOTE — Progress Notes (Signed)
MBSS complete. Full report located under chart review in imaging section.  Gunnar Fusi, M.A., CCC-SLP [phone]

## 2015-08-06 NOTE — Care Management Note (Addendum)
Case Management Note  Patient Details  Name: Debbie Kramer MRN: [id number] Date of Birth: 04-25-1926  Subjective/Objective: Patient chose [facility]( [facility] [place]) for HHPT, referral given to [facility] with [facility].  Soc will begin 24-48 hrs post dc. Patient's daughter would like for her to participate in the [facility] Psychiatric Transition for Pna, order put in for [facility].  NCM verified home phone [phone].                   Action/Plan:   Expected Discharge Date:                  Expected Discharge Plan:  [place]  In-House Referral:     Discharge planning Services  CM Consult  Post Acute Care Choice:    Choice offered to:     DME Arranged:    DME Agency:     HH Arranged:    [place] Agency:     Status of Service:  In process, will continue to follow  Medicare Important Message Given:    Date Medicare IM Given:    Medicare IM give by:    Date Additional Medicare IM Given:    Additional Medicare Important Message give by:     If discussed at [place] of Stay Meetings, dates discussed:    Additional Comments:  Zenon Mayo, RN 08/06/2015, 3:11 PM

## 2015-08-07 DIAGNOSIS — J189 Pneumonia, unspecified organism: Secondary | ICD-10-CM

## 2015-08-07 NOTE — Progress Notes (Signed)
Speech Language Pathology Treatment: Dysphagia  Patient Details Name: Debbie Kramer MRN: [id number] DOB: [birth year] Today's Date: 08/07/2015 Time: 6294-7654 SLP Time Calculation (min) (ACUTE ONLY): 13 min  Assessment / Plan / Recommendation Clinical Impression  Dysphagia intervention with breakfast. She recalled one of two swallow strategies, required min-mod cues to implement. Reviewed results of MBS and reiterated importance of small sips with MBS results and COPD. RN reported pt did well last evening with pills whole in applesauce however observed her with single pills with Breeze drink without difficulty. Slight change in respirations during meal. Continue regular texture and thin liquids, no further ST needed.   HPI HPI: Pt is a 82 y.o. female with past medical history of hiatal hernia, Rheumatoid arthritis, tobacco abuse, Hypertension; CVA 10/2014, Degenerative disc disease; environmental allergies; Bronchitis; and COPD admitted with 1 month progressive shortness of breath and some coughing. Chest x-ray showed new faint peripheral infiltrate left lung base. BSE 10/24/14 recommended regular and thin, coughing pt states is baseline, pt/family opted to initiate po's versus MBS.       SLP Plan  All goals met;Discharge SLP treatment due to (comment)     Recommendations  Diet recommendations: Regular;Thin liquid Liquids provided via: Cup;Straw Medication Administration: Whole meds with liquid (one at a time) Supervision: Patient able to self feed Compensations: Small sips/bites;Slow rate;Multiple dry swallows after each bite/sip Postural Changes and/or Swallow Maneuvers: Seated upright 90 degrees              Oral Care Recommendations: Oral care BID Follow up Recommendations: None Plan: All goals met;Discharge SLP treatment due to (comment)   [place] Siren 08/07/2015, 9:28 AM   Orbie Pyo Colvin Caroli.Ed [employer] [phone]

## 2015-08-07 NOTE — Progress Notes (Signed)
Patient Demographics:    Debbie Kramer, is a 82 y.o. female, DOB - Mar 03, 1926, DEY:[id number]  Admit date - 08/04/2015   Admitting Physician Toy Baker, MD  Outpatient Primary MD for the patient is Haywood Pao, MD  LOS - 3   Chief Complaint  Patient presents with  . Shortness of Breath        Subjective:    [name] today has, No headache, No chest pain, No abdominal pain - No Nausea, No new weakness tingling or numbness, approved cough and shortness of breath.   Assessment  & Plan :     1. CAP. Noted CT scan, she already feels better on empiric IV antibiotics which will be continued to complete the course. , continue supportive care with oxygen and nebulizer treatments, monitor cultures.  2. NSTEMI. Chr.Systolic.CHF EF 45% - Cardiology on board, chest pain-free, EKG nonacute, on aspirin, ACE,  beta blocker and statin for secondary prevention along with Lovenox. Echocardiogram noted. Will monitor. Cardiology recommended outpatient non invasive testing after pneumonia is resolved.   3. Essential hypertension. Continue ACE inhibitor, beta blocker, have increased beta blocker dose and dropped ACE inhibitor for better heart rate control.  4. Dyslipidemia. On statin continue.  5. Mediastinal fullness noted on CT scan chest. Needs repeat CT scan of the chest in 3 months.    Code Status : Full  Family Communication  : None present  Disposition Plan  : Likely SNF in 2-3 days  Consults  :  Cards  Procedures  :   CT chest - confirms pneumonia, mediastinal fullness needs repeat CT scan in 3 months.  Echogram    Mild to moderate LVH with LVEF approximately 40-45% and largearea of mid to apical periapical akinesis suggestive of possibleTakotsubo cardiomyopathy (ischemic  cardiomyopathy not excluded). LV dysfunction is new compared to the prior study in March 2016. Prominent LV apical trabeculation without definitive thrombus by Definity contrast imaging. Grade 1 diastolic dysfunction. Mild left atrial enlargement. Mild mitral regurgitation. MIld aortic stenosis. Mild tricuspid regurgitation with PASP 43 mmHg.    DVT Prophylaxis  :  Lovenox    Lab Results  Component Value Date   PLT 291 08/06/2015    Inpatient Medications  Scheduled Meds: . aspirin  325 mg Oral Daily  . benazepril  10 mg Oral Daily  . cefTRIAXone (ROCEPHIN)  IV  1 g Intravenous Q24H  . doxycycline  100 mg Oral Q12H  . enoxaparin (LOVENOX) injection  60 mg Subcutaneous Q12H  . feeding supplement  1 Container Oral TID BM  . Fluticasone Furoate-Vilanterol  1 puff Inhalation Daily  . HYDROcodone-acetaminophen  1 tablet Oral BID  . metoprolol tartrate  50 mg Oral BID  . simvastatin  10 mg Oral q1800   Continuous Infusions:  PRN Meds:.  Antibiotics  :     Anti-infectives    Start     Dose/Rate Route Frequency Ordered Stop   08/05/15 2200  cefTRIAXone (ROCEPHIN) 1 g in dextrose 5 % 50 mL IVPB     1 g 100 mL/hr over 30 Minutes Intravenous Every 24 hours 08/04/15 2233 08/12/15 2159   08/04/15 2245  doxycycline (VIBRA-TABS) tablet 100 mg     100 mg Oral Every 12 hours 08/04/15  2233     08/04/15 1930  cefTRIAXone (ROCEPHIN) 1 g in dextrose 5 % 50 mL IVPB     1 g 100 mL/hr over 30 Minutes Intravenous  Once 08/04/15 1922 08/04/15 2111   08/04/15 1930  azithromycin (ZITHROMAX) 500 mg in dextrose 5 % 250 mL IVPB     500 mg 250 mL/hr over 60 Minutes Intravenous  Once 08/04/15 1922 08/04/15 2211        Objective:   Filed Vitals:   08/06/15 1809 08/06/15 2046 08/07/15 0457 08/07/15 1422  BP: 139/68 125/60 140/64 103/52  Pulse: 73 65 56 57  Temp: 98.8 F (37.1 C) 98.9 F (37.2 C) 98.2 F (36.8 C) 98.1 F (36.7 C)  TempSrc: Oral Oral Oral Oral  Resp: '20 18 18 19  '$ Height:       Weight:      SpO2: 97% 97% 97% 100%    Wt Readings from Last 3 Encounters:  08/04/15 60.2 kg (132 lb 11.5 oz)  07/07/15 62.71 kg (138 lb 4 oz)  01/10/15 65.681 kg (144 lb 12.8 oz)     Intake/Output Summary (Last 24 hours) at 08/07/15 1859 Last data filed at 08/07/15 1700  Gross per 24 hour  Intake    560 ml  Output   2000 ml  Net  -1440 ml     Physical Exam  Awake Alert, Oriented X 3, No new F.N deficits, Normal affect [place].AT,PERRAL Supple Neck,No JVD, No cervical lymphadenopathy appriciated.  Symmetrical Chest wall movement, Good air movement bilaterally, Coarse B sounds RRR,No Gallops,Rubs or new Murmurs, No Parasternal Heave +ve B.Sounds, Abd Soft, No tenderness, No organomegaly appriciated, No rebound - guarding or rigidity. No Cyanosis, Clubbing or edema, No new Rash or bruise     Data Review:   Micro Results Recent Results (from the past 240 hour(s))  Culture, Urine     Status: None   Collection Time: 08/04/15  8:25 PM  Result Value Ref Range Status   Specimen Description URINE, CLEAN CATCH  Final   Special Requests NONE  Final   Culture MULTIPLE SPECIES PRESENT, SUGGEST RECOLLECTION  Final   Report Status 08/06/2015 FINAL  Final    Radiology Reports Dg Chest 2 View  08/04/2015  CLINICAL DATA:  Progressive shortness of breath and productive cough. EXAM: CHEST  2 VIEW COMPARISON:  07/07/2015 FINDINGS: There is a faint peripheral infiltrate at the left lung base. There is prominent peribronchial thickening bilaterally. No effusions. There is tortuosity and calcification of the thoracic aorta. Heart size is at the upper limits are normal. No acute osseous abnormality. IMPRESSION: New faint peripheral infiltrate at the left lung base. Prominent bronchitic changes. Aortic atherosclerosis. Electronically Signed   By: Lorriane Shire M.D.   On: 08/04/2015 15:35   Ct Angio Chest Pe W/cm &/or Wo Cm  08/04/2015  CLINICAL DATA:  Progressive shortness of breath for 1 week.  Productive cough. EXAM: CT ANGIOGRAPHY CHEST WITH CONTRAST TECHNIQUE: Multidetector CT imaging of the chest was performed using the standard protocol during bolus administration of intravenous contrast. Multiplanar CT image reconstructions and MIPs were obtained to evaluate the vascular anatomy. CONTRAST:  31m OMNIPAQUE IOHEXOL 350 MG/ML SOLN COMPARISON:  Chest radiograph earlier this day. FINDINGS: There are no filling defects within the pulmonary arteries to suggest pulmonary embolus. Atherosclerosis throughout the thoracic aorta, no aneurysm. There are coronary artery calcifications. Dense mitral annulus calcifications. No pericardial effusion. No mediastinal or hilar adenopathy. Severe bronchial thickening, most prominent in the lower lobes,  left greater than right. Ill-defined linear and patchy opacities in the lingula and left lower lobe. Ill-defined opacities in the posterior upper lobes, left greater than right, some of which appear nodular. Within the paramediastinal left lung apex with a 2.1 x 1.5 cm confluent opacity that abuts the left subclavian artery. Calcified granuloma in the right upper lobe, image 30. There is a small left pleural effusion. No definite right pleural effusion. Extensive atherosclerosis of the included upper abdominal vasculature. Calcified granuloma in the liver. Subcutaneous lipoma involving the left lateral lower chest wall, measures 6.9 x 3.4 cm. Small to moderate hiatal hernia. Implanted monitoring device in the anterior left chest wall. Advanced multilevel degenerative change throughout the thoracic spine. There are no acute or suspicious osseous abnormalities. Review of the MIP images confirms the above findings. IMPRESSION: 1. No pulmonary embolus. 2. Severe bronchial thickening with multifocal patchy, confluent, and linear opacities throughout all lobes both lungs, left greater than right. Findings are most significant in the left lung base involving the lingula and lower  lobe. Suspect bronchitis with multifocal pneumonia, however some of these have a nodular appearance. The confluent left apical opacity abuts the mediastinum, and attention to this at follow-up is recommended. Recommend follow-up chest CT in 3-4 months. 3. Severe atherosclerosis including the coronary arteries. Electronically Signed   By: Jeb Levering M.D.   On: 08/04/2015 20:49   Dg Swallowing Func-speech Pathology  08/06/2015  Objective Swallowing Evaluation:   Patient Details Name: EDWARD TREVINO MRN: [id number] Date of Birth: 07-Dec-1925 Today's Date: 08/06/2015 Time: SLP Start Time (ACUTE ONLY): 1102-SLP Stop Time (ACUTE ONLY): 1120 SLP Time Calculation (min) (ACUTE ONLY): 18 min Past Medical History: '@PMH'$ @ Past Surgical History: Past Surgical History Procedure Laterality Date . Hernia repair     umbilical . Knee surgery Bilateral 1996, 1997   replacements . Cholecystectomy     lap choley . Loop recorder implant N/A 10/25/2014   Procedure: LOOP RECORDER IMPLANT;  Surgeon: Deboraha Sprang, MD;  Location: [facility] CATH LAB;  Service: Cardiovascular;  Laterality: N/A; HPI: Pt is a 82 y.o. female with past medical history of hiatal hernia, Rheumatoid arthritis, tobacco abuse, Hypertension; CVA 10/2014, Degenerative disc disease; environmental allergies; Bronchitis; and COPD admitted with 1 month progressive shortness of breath and some coughing. Chest x-ray showed new faint peripheral infiltrate left lung base. BSE 10/24/14 recommended regular and thin, coughing pt states is baseline, pt/family opted to initiate po's versus MBS.  No Data Recorded Assessment / Plan / Recommendation CHL IP CLINICAL IMPRESSIONS 08/06/2015 Therapy Diagnosis Mild pharyngeal phase dysphagia Clinical Impression Patient demonstrates mild pharyngeal dysphagia marked by trace base of tongue weakness and decreased coordination resulting in intermittent  delayed swallow initiation and silent deep penetration. Cues for small portions and a slow pace,  which allowed for multiple swallows improved safety and only intermittent flash penetration was observed.  Recommend continuation of current diet orders with implementation of safe swallow strategies to reduce risk and meds whole in puree.  SLP to follow briefly for tolerance and education.   Impact on safety and function Mild aspiration risk   CHL IP TREATMENT RECOMMENDATION 08/06/2015 Treatment Recommendations Therapy as outlined in treatment plan below   Prognosis 08/06/2015 Prognosis for Safe Diet Advancement Good Barriers to Reach Goals -- Barriers/Prognosis Comment -- CHL IP DIET RECOMMENDATION 08/06/2015 SLP Diet Recommendations Regular solids;Thin liquid Liquid Administration via Cup;Straw Medication Administration Whole meds with puree Compensations Small sips/bites;Slow rate;Multiple dry swallows after each bite/sip Postural Changes Seated upright  at 90 degrees   CHL IP OTHER RECOMMENDATIONS 08/06/2015 Recommended Consults -- Oral Care Recommendations Oral care BID Other Recommendations --   CHL IP FOLLOW UP RECOMMENDATIONS 08/06/2015 Follow up Recommendations None   CHL IP FREQUENCY AND DURATION 08/06/2015 Speech Therapy Frequency (ACUTE ONLY) min 2x/week Treatment Duration 1 week      CHL IP ORAL PHASE 08/06/2015 Oral Phase WFL Oral - Pudding Teaspoon -- Oral - Pudding Cup -- Oral - Honey Teaspoon -- Oral - Honey Cup -- Oral - Nectar Teaspoon -- Oral - Nectar Cup -- Oral - Nectar Straw -- Oral - Thin Teaspoon -- Oral - Thin Cup -- Oral - Thin Straw -- Oral - Puree -- Oral - Mech Soft -- Oral - Regular -- Oral - Multi-Consistency -- Oral - Pill -- Oral Phase - Comment --  CHL IP PHARYNGEAL PHASE 08/06/2015 Pharyngeal Phase Impaired Pharyngeal- Pudding Teaspoon -- Pharyngeal -- Pharyngeal- Pudding Cup -- Pharyngeal -- Pharyngeal- Honey Teaspoon -- Pharyngeal -- Pharyngeal- Honey Cup -- Pharyngeal -- Pharyngeal- Nectar Teaspoon -- Pharyngeal -- Pharyngeal- Nectar Cup -- Pharyngeal -- Pharyngeal- Nectar Straw -- Pharyngeal  -- Pharyngeal- Thin Teaspoon -- Pharyngeal -- Pharyngeal- Thin Cup Delayed swallow initiation-vallecula;Penetration/Aspiration during swallow;Trace aspiration;Compensatory strategies attempted (with notebox) Pharyngeal Material enters airway, CONTACTS cords and not ejected out;Material enters airway, remains ABOVE vocal cords then ejected out Pharyngeal- Thin Straw Trace aspiration;Penetration/Aspiration during swallow;Delayed swallow initiation-vallecula;Compensatory strategies attempted (with notebox) Pharyngeal Material enters airway, remains ABOVE vocal cords then ejected out Pharyngeal- Puree -- Pharyngeal -- Pharyngeal- Mechanical Soft -- Pharyngeal -- Pharyngeal- Regular -- Pharyngeal -- Pharyngeal- Multi-consistency -- Pharyngeal -- Pharyngeal- Pill -- Pharyngeal -- Pharyngeal Comment --  CHL IP CERVICAL ESOPHAGEAL PHASE 08/06/2015 Cervical Esophageal Phase Impaired Pudding Teaspoon -- Pudding Cup -- Honey Teaspoon -- Honey Cup -- Nectar Teaspoon -- Nectar Cup -- Nectar Straw -- Thin Teaspoon -- Thin Cup -- Thin Straw -- Puree -- Mechanical Soft -- Regular -- Multi-consistency -- Pill -- Cervical Esophageal Comment -- Gunnar Fusi, M.A., CCC-SLP [phone] BOWIE,MELISSA 08/06/2015, 12:03 PM                CBC  Recent Labs Lab 08/04/15 1745 08/05/15 0730 08/06/15 0501  WBC 16.6* 10.2 8.7  HGB 14.2 12.3 13.4  HCT 42.0 37.3 39.7  PLT 263 258 291  MCV 92.9 92.6 92.1  MCH 31.4 30.5 31.1  MCHC 33.8 33.0 33.8  RDW 15.0 15.0 14.8  LYMPHSABS  --  1.5  --   MONOABS  --  0.5  --   EOSABS  --  0.0  --   BASOSABS  --  0.0  --     Chemistries   Recent Labs Lab 08/04/15 1439 08/05/15 0730 08/06/15 0501  NA 135 138 137  K 3.4* 3.8 3.9  CL 97* 100* 103  CO2 '26 26 24  '$ GLUCOSE 140* 89 81  BUN '18 11 9  '$ CREATININE 0.78 0.70 0.49  CALCIUM 8.5* 7.9* 8.1*  MG  --   --  2.0  AST  --  28  --   ALT  --  18  --   ALKPHOS  --  63  --   BILITOT  --  0.8  --     ------------------------------------------------------------------------------------------------------------------ estimated creatinine clearance is 40.2 mL/min (by C-G formula based on Cr of 0.49). ------------------------------------------------------------------------------------------------------------------ No results for input(s): HGBA1C in the last 72 hours. ------------------------------------------------------------------------------------------------------------------ No results for input(s): CHOL, HDL, LDLCALC, TRIG, CHOLHDL, LDLDIRECT in the last 72 hours. ------------------------------------------------------------------------------------------------------------------  Recent  Labs  08/06/15 0501  TSH 1.412   ------------------------------------------------------------------------------------------------------------------ No results for input(s): VITAMINB12, FOLATE, FERRITIN, TIBC, IRON, RETICCTPCT in the last 72 hours.  Coagulation profile No results for input(s): INR, PROTIME in the last 168 hours.  No results for input(s): DDIMER in the last 72 hours.  Cardiac Enzymes  Recent Labs Lab 08/04/15 2044 08/05/15 0220 08/05/15 0730  TROPONINI 1.51* 1.36* 0.94*   ------------------------------------------------------------------------------------------------------------------ Invalid input(s): POCBNP   Time Spent in minutes   35   [place] M.D on 08/07/2015 at 6:59 PM  Between 7am to 7pm - Pager - [phone]  After 7pm go to [email or web address] - password [facility]  [facility] -  Office  [phone]

## 2015-08-07 NOTE — Care Management Important Message (Signed)
Important Message  Patient Details  Name: Debbie Kramer MRN: [id number] Date of Birth: Dec 01, 1925   Medicare Important Message Given:  Yes    Nyzir Dubois P Henderson 08/07/2015, 1:21 PM

## 2015-08-08 DIAGNOSIS — E440 Moderate protein-calorie malnutrition: Secondary | ICD-10-CM | POA: Insufficient documentation

## 2015-08-08 MED ORDER — METOPROLOL TARTRATE 50 MG PO TABS: 50.0000 mg | ORAL_TABLET | Freq: Two times a day (BID) | ORAL | Status: DC

## 2015-08-08 MED ORDER — BOOST / RESOURCE BREEZE PO LIQD: 1.0000 | Freq: Three times a day (TID) | ORAL | Status: DC

## 2015-08-08 MED ORDER — LEVOFLOXACIN 500 MG PO TABS: 500.0000 mg | ORAL_TABLET | Freq: Every day | ORAL | Status: DC

## 2015-08-08 NOTE — Progress Notes (Signed)
Patient Name: Debbie Kramer Date of Encounter: 08/08/2015   SUBJECTIVE  Feeling well. No chest pain, sob or palpitations. Cough improved.   CURRENT MEDS . aspirin  325 mg Oral Daily  . benazepril  10 mg Oral Daily  . cefTRIAXone (ROCEPHIN)  IV  1 g Intravenous Q24H  . doxycycline  100 mg Oral Q12H  . enoxaparin (LOVENOX) injection  60 mg Subcutaneous Q12H  . feeding supplement  1 Container Oral TID BM  . Fluticasone Furoate-Vilanterol  1 puff Inhalation Daily  . HYDROcodone-acetaminophen  1 tablet Oral BID  . metoprolol tartrate  50 mg Oral BID  . simvastatin  10 mg Oral q1800    OBJECTIVE  Filed Vitals:   08/07/15 2054 08/08/15 0015 08/08/15 0547 08/08/15 1111  BP: 128/58 130/60 122/54   Pulse: 66 64 64   Temp: 98.8 F (37.1 C) 98 F (36.7 C) 98.3 F (36.8 C)   TempSrc: Oral Oral Oral   Resp: '18 18 20   '$ Height:      Weight:      SpO2: 97% 100% 98% 96%    Intake/Output Summary (Last 24 hours) at 08/08/15 1215 Last data filed at 08/08/15 0940  Gross per 24 hour  Intake    540 ml  Output    950 ml  Net   -410 ml   Filed Weights   08/04/15 1900 08/04/15 2234  Weight: 138 lb 4 oz (62.71 kg) 132 lb 11.5 oz (60.2 kg)    PHYSICAL EXAM  General: Pleasant, NAD. Neuro: Alert and oriented X 3. Moves all extremities spontaneously. Psych: Normal affect. HEENT:  Normal  Neck: Supple without bruits or JVD. Lungs:  Resp regular and unlabored. Scattered wheezing.  Heart: RRR no s3, s4, or murmurs. Abdomen: Soft, non-tender, non-distended, BS + x 4.  Extremities: No clubbing, cyanosis or edema. DP/PT/Radials 2+ and equal bilaterally.  Accessory Clinical Findings  CBC  Recent Labs  08/06/15 0501  WBC 8.7  HGB 13.4  HCT 39.7  MCV 92.1  PLT 630   Basic Metabolic Panel  Recent Labs  08/06/15 0501  NA 137  K 3.9  CL 103  CO2 24  GLUCOSE 81  BUN 9  CREATININE 0.49  CALCIUM 8.1*  MG 2.0  PHOS 2.9   Thyroid Function Tests  Recent Labs   08/06/15 0501  TSH 1.412    TELE  Sinus rhythm  Radiology/Studies  Dg Chest 2 View  08/04/2015  CLINICAL DATA:  Progressive shortness of breath and productive cough. EXAM: CHEST  2 VIEW COMPARISON:  07/07/2015 FINDINGS: There is a faint peripheral infiltrate at the left lung base. There is prominent peribronchial thickening bilaterally. No effusions. There is tortuosity and calcification of the thoracic aorta. Heart size is at the upper limits are normal. No acute osseous abnormality. IMPRESSION: New faint peripheral infiltrate at the left lung base. Prominent bronchitic changes. Aortic atherosclerosis. Electronically Signed   By: Lorriane Shire M.D.   On: 08/04/2015 15:35   Ct Angio Chest Pe W/cm &/or Wo Cm  08/04/2015  CLINICAL DATA:  Progressive shortness of breath for 1 week. Productive cough. EXAM: CT ANGIOGRAPHY CHEST WITH CONTRAST TECHNIQUE: Multidetector CT imaging of the chest was performed using the standard protocol during bolus administration of intravenous contrast. Multiplanar CT image reconstructions and MIPs were obtained to evaluate the vascular anatomy. CONTRAST:  84m OMNIPAQUE IOHEXOL 350 MG/ML SOLN COMPARISON:  Chest radiograph earlier this day. FINDINGS: There are no filling defects within the pulmonary  arteries to suggest pulmonary embolus. Atherosclerosis throughout the thoracic aorta, no aneurysm. There are coronary artery calcifications. Dense mitral annulus calcifications. No pericardial effusion. No mediastinal or hilar adenopathy. Severe bronchial thickening, most prominent in the lower lobes, left greater than right. Ill-defined linear and patchy opacities in the lingula and left lower lobe. Ill-defined opacities in the posterior upper lobes, left greater than right, some of which appear nodular. Within the paramediastinal left lung apex with a 2.1 x 1.5 cm confluent opacity that abuts the left subclavian artery. Calcified granuloma in the right upper lobe, image 30. There  is a small left pleural effusion. No definite right pleural effusion. Extensive atherosclerosis of the included upper abdominal vasculature. Calcified granuloma in the liver. Subcutaneous lipoma involving the left lateral lower chest wall, measures 6.9 x 3.4 cm. Small to moderate hiatal hernia. Implanted monitoring device in the anterior left chest wall. Advanced multilevel degenerative change throughout the thoracic spine. There are no acute or suspicious osseous abnormalities. Review of the MIP images confirms the above findings. IMPRESSION: 1. No pulmonary embolus. 2. Severe bronchial thickening with multifocal patchy, confluent, and linear opacities throughout all lobes both lungs, left greater than right. Findings are most significant in the left lung base involving the lingula and lower lobe. Suspect bronchitis with multifocal pneumonia, however some of these have a nodular appearance. The confluent left apical opacity abuts the mediastinum, and attention to this at follow-up is recommended. Recommend follow-up chest CT in 3-4 months. 3. Severe atherosclerosis including the coronary arteries. Electronically Signed   By: Jeb Levering M.D.   On: 08/04/2015 20:49   Dg Swallowing Func-speech Pathology  08/06/2015  Objective Swallowing Evaluation:   Patient Details Name: Debbie Kramer MRN: [id number] Date of Birth: 13-Jul-1926 Today's Date: 08/06/2015 Time: SLP Start Time (ACUTE ONLY): 1102-SLP Stop Time (ACUTE ONLY): 1120 SLP Time Calculation (min) (ACUTE ONLY): 18 min Past Medical History: '@PMH'$ @ Past Surgical History: Past Surgical History Procedure Laterality Date . Hernia repair     umbilical . Knee surgery Bilateral 1996, 1997   replacements . Cholecystectomy     lap choley . Loop recorder implant N/A 10/25/2014   Procedure: LOOP RECORDER IMPLANT;  Surgeon: Deboraha Sprang, MD;  Location: [facility] CATH LAB;  Service: Cardiovascular;  Laterality: N/A; HPI: Pt is a 82 y.o. female with past medical history of hiatal  hernia, Rheumatoid arthritis, tobacco abuse, Hypertension; CVA 10/2014, Degenerative disc disease; environmental allergies; Bronchitis; and COPD admitted with 1 month progressive shortness of breath and some coughing. Chest x-ray showed new faint peripheral infiltrate left lung base. BSE 10/24/14 recommended regular and thin, coughing pt states is baseline, pt/family opted to initiate po's versus MBS.  No Data Recorded Assessment / Plan / Recommendation CHL IP CLINICAL IMPRESSIONS 08/06/2015 Therapy Diagnosis Mild pharyngeal phase dysphagia Clinical Impression Patient demonstrates mild pharyngeal dysphagia marked by trace base of tongue weakness and decreased coordination resulting in intermittent  delayed swallow initiation and silent deep penetration. Cues for small portions and a slow pace, which allowed for multiple swallows improved safety and only intermittent flash penetration was observed.  Recommend continuation of current diet orders with implementation of safe swallow strategies to reduce risk and meds whole in puree.  SLP to follow briefly for tolerance and education.   Impact on safety and function Mild aspiration risk   CHL IP TREATMENT RECOMMENDATION 08/06/2015 Treatment Recommendations Therapy as outlined in treatment plan below   Prognosis 08/06/2015 Prognosis for Safe Diet Advancement Good Barriers to Reach Goals --  Barriers/Prognosis Comment -- CHL IP DIET RECOMMENDATION 08/06/2015 SLP Diet Recommendations Regular solids;Thin liquid Liquid Administration via Cup;Straw Medication Administration Whole meds with puree Compensations Small sips/bites;Slow rate;Multiple dry swallows after each bite/sip Postural Changes Seated upright at 90 degrees   CHL IP OTHER RECOMMENDATIONS 08/06/2015 Recommended Consults -- Oral Care Recommendations Oral care BID Other Recommendations --   CHL IP FOLLOW UP RECOMMENDATIONS 08/06/2015 Follow up Recommendations None   CHL IP FREQUENCY AND DURATION 08/06/2015 Speech Therapy Frequency  (ACUTE ONLY) min 2x/week Treatment Duration 1 week      CHL IP ORAL PHASE 08/06/2015 Oral Phase WFL Oral - Pudding Teaspoon -- Oral - Pudding Cup -- Oral - Honey Teaspoon -- Oral - Honey Cup -- Oral - Nectar Teaspoon -- Oral - Nectar Cup -- Oral - Nectar Straw -- Oral - Thin Teaspoon -- Oral - Thin Cup -- Oral - Thin Straw -- Oral - Puree -- Oral - Mech Soft -- Oral - Regular -- Oral - Multi-Consistency -- Oral - Pill -- Oral Phase - Comment --  CHL IP PHARYNGEAL PHASE 08/06/2015 Pharyngeal Phase Impaired Pharyngeal- Pudding Teaspoon -- Pharyngeal -- Pharyngeal- Pudding Cup -- Pharyngeal -- Pharyngeal- Honey Teaspoon -- Pharyngeal -- Pharyngeal- Honey Cup -- Pharyngeal -- Pharyngeal- Nectar Teaspoon -- Pharyngeal -- Pharyngeal- Nectar Cup -- Pharyngeal -- Pharyngeal- Nectar Straw -- Pharyngeal -- Pharyngeal- Thin Teaspoon -- Pharyngeal -- Pharyngeal- Thin Cup Delayed swallow initiation-vallecula;Penetration/Aspiration during swallow;Trace aspiration;Compensatory strategies attempted (with notebox) Pharyngeal Material enters airway, CONTACTS cords and not ejected out;Material enters airway, remains ABOVE vocal cords then ejected out Pharyngeal- Thin Straw Trace aspiration;Penetration/Aspiration during swallow;Delayed swallow initiation-vallecula;Compensatory strategies attempted (with notebox) Pharyngeal Material enters airway, remains ABOVE vocal cords then ejected out Pharyngeal- Puree -- Pharyngeal -- Pharyngeal- Mechanical Soft -- Pharyngeal -- Pharyngeal- Regular -- Pharyngeal -- Pharyngeal- Multi-consistency -- Pharyngeal -- Pharyngeal- Pill -- Pharyngeal -- Pharyngeal Comment --  CHL IP CERVICAL ESOPHAGEAL PHASE 08/06/2015 Cervical Esophageal Phase Impaired Pudding Teaspoon -- Pudding Cup -- Honey Teaspoon -- Honey Cup -- Nectar Teaspoon -- Nectar Cup -- Nectar Straw -- Thin Teaspoon -- Thin Cup -- Thin Straw -- Puree -- Mechanical Soft -- Regular -- Multi-consistency -- Pill -- Cervical Esophageal Comment --  Gunnar Fusi, M.A., CCC-SLP [phone] BOWIE,MELISSA 08/06/2015, 12:03 PM               ASSESSMENT AND PLAN   1. Elevated troponin - Demand ischemia in setting of pneumonia. Peak of trop 1.51--> 136-->0.94. Atherosclerosis was seen on CT. EKG non ischemic. - Echo 08/05/15: EF 40-45%, akinesis of the mid-apicalanteroseptal, anterior, inferior, and apical myocardium. Suggestive of Takotsubo cardiomyopathy or ischemic etiology. G1DD. Mild MR. Mild to mod LVH.  - continue current medical regimen.  - She will need ischemic eval at some point. Likely outpatient Myoview.   2. Hypertensive heart disease - BP is stable and will controlled.   3. HL - Continue statin  4. CAP - per primary    Signed, Bhagat,Bhavinkumar PA-C

## 2015-08-08 NOTE — Progress Notes (Signed)
Physical Therapy Treatment Patient Details Name: Debbie Kramer MRN: [id number] DOB: 01/14/1926 Today's Date: 2015/09/03    History of Present Illness Pt adm with PNA and NSTEMI. PMH - HTN, RA    PT Comments    Pt making steady progress.  Follow Up Recommendations  Home health PT     Equipment Recommendations  None recommended by PT    Recommendations for Other Services       Precautions / Restrictions Precautions Precautions: Fall    Mobility  Bed Mobility Overal bed mobility: Modified Independent             General bed mobility comments: HOB elevated  Transfers Overall transfer level: Needs assistance Equipment used: Straight cane Transfers: Sit to/from Stand Sit to Stand: Supervision         General transfer comment: for safety  Ambulation/Gait Ambulation/Gait assistance: Min guard Ambulation Distance (Feet): 210 Feet Assistive device: Straight cane Gait Pattern/deviations: Step-through pattern;Decreased stride length;Trunk flexed   Gait velocity interpretation: <1.8 ft/sec, indicative of risk for recurrent falls General Gait Details: Slightly unsteady. Amb on RA with SpO2 of 92% after amb   Stairs            Wheelchair Mobility    Modified Rankin (Stroke Patients Only)       Balance Overall balance assessment: Needs assistance Sitting-balance support: No upper extremity supported;Feet supported Sitting balance-Leahy Scale: Normal     Standing balance support: No upper extremity supported;During functional activity Standing balance-Leahy Scale: Fair                      Cognition Arousal/Alertness: Awake/alert Behavior During Therapy: WFL for tasks assessed/performed Overall Cognitive Status: Within Functional Limits for tasks assessed                      Exercises      General Comments        Pertinent Vitals/Pain Pain Assessment: No/denies pain    Home Living                      Prior  Function            PT Goals (current goals can now be found in the care plan section) Progress towards PT goals: Progressing toward goals    Frequency  Min 3X/week    PT Plan Current plan remains appropriate    Co-evaluation             End of Session   Activity Tolerance: Patient tolerated treatment well Patient left: in chair;with call bell/phone within reach;with chair alarm set     Time: 1001-1015 PT Time Calculation (min) (ACUTE ONLY): 14 min  Charges:  $Gait Training: 8-22 mins                    G Codes:      Debbie Kramer 2015-09-03, 12:26 PM [employer] PT [phone]

## 2015-08-08 NOTE — Progress Notes (Signed)
Nutrition Follow-up  DOCUMENTATION CODES:   Non-severe (moderate) malnutrition in context of acute illness/injury  INTERVENTION:   -D/c Boost Breeze po TID, each supplement provides 250 kcal and 9 grams of protein, due to pt preference -Ensure Enlive po BID, each supplement provides 350 kcal and 20 grams of protein  NUTRITION DIAGNOSIS:   Predicted suboptimal nutrient intake related to poor appetite as evidenced by per patient/family report.  Ongoing  GOAL:   Patient will meet greater than or equal to 90% of their needs  Progressing  MONITOR:   PO intake, Supplement acceptance, Labs, Weight trends, Skin, I & O's  REASON FOR ASSESSMENT:   Malnutrition Screening Tool    ASSESSMENT:   82 year old female history of CVA here with possible CAP elevated troponin and abnormal ECG cardiology is aware, will cover for CAP and continue lovenox  Pt underwent on 08/06/15, which revealed mild pharyngeal dysphagia. SLP re-evaluated on 08/07/15 and pt was advanced to a regular consistency diet with thin liquids.  Spoke with pt at bedside, who reports that she is tolerating current diet textures well. She reveals that she ate "less than half" of her breakfast. Meal completion 50-65%. Pt reports her appetite is fair at baseline. Typical intake PTA was a sausage biscuit at breakfast, nabs or tuna salad sandwich at lunch, and chips for dinner. Beverages consisted mainly of sweet tea.   Pt endorses wt loss PTA. Noted a 4.3% wt loss x 1 month. She knows that she has lost weight, as she has noted fat and muscle loss. Nutrition-Focused physical exam completed. Findings are mild to moderate fat depletion, mild to moderate muscle depletion, and no edema.   Pt reports she has been drinking the Boost Breeze supplements, but prefers Ensure. Discussed importance of good meal and supplement intake to promote healing.   Therapy following. Recommending Home Health services. Pt reports she has a very supportive  family, including a daughter (who os employed as a Web designer who resides with her) and a daughter who is employed as a Quarry manager who lives a few miles away from her. She reports her daughters plan on sharing responsibilities with caring for her.   Labs reviewed.   Diet Order:  Diet Heart Room service appropriate?: Yes; Fluid consistency:: Thin  Skin:  Reviewed, no issues  Last BM:  08/06/15  Height:   Ht Readings from Last 1 Encounters:  08/04/15 5' (1.524 m)    Weight:   Wt Readings from Last 1 Encounters:  08/04/15 132 lb 11.5 oz (60.2 kg)    Ideal Body Weight:  45.5 kg  BMI:  Body mass index is 25.92 kg/(m^2).  Estimated Nutritional Needs:   Kcal:  1300-1500  Protein:  60-70 grams  Fluid:  1.3-1.5 L  EDUCATION NEEDS:   Education needs addressed  Moriah Shawley A. Jimmye Norman, RD, LDN, CDE Pager: [phone] After hours Pager: [phone]

## 2015-08-08 NOTE — Progress Notes (Signed)
Patient was discharged home by MD order; discharged instructions review and give to patient and her daughter with care notes and prescriptions; IV DIC; skin intact; patient will be escorted to the car by nurse tech via wheelchair.

## 2015-08-09 NOTE — Discharge Summary (Addendum)
Physician Discharge Summary  Debbie Kramer:[id number] DOB: Mar 07, 1926 DOA: 08/04/2015  PCP: Haywood Pao, MD  Admit date: 08/04/2015 Discharge date: 08/08/2015  Time spent: 30 minutes  Recommendations for Outpatient Follow-up:  1. Follow up with PCP in one week.  2. Please obtain CT chest in 3 months 3. Follow up w ith cardiology for outpatient stress myoview.    Discharge Diagnoses:  Active Problems:   Essential hypertension   Tobacco abuse   CAP (community acquired pneumonia)   Elevated troponin   Abnormal ECG   Prolonged QT interval   Malnutrition of moderate degree   Discharge Condition: improved  Diet recommendation: low sodium heart healthy diet.   Filed Weights   08/04/15 1900 08/04/15 2234  Weight: 62.71 kg (138 lb 4 oz) 60.2 kg (132 lb 11.5 oz)    History of present illness:  Debbie Kramer is a 82 y.o. female   has a past medical history of Rheumatoid arthritis(714.0); Hypertension; Degenerative disc disease; Environmental allergies; Bronchitis; and COPD (chronic obstructive pulmonary disease) ([place]). Presented with patient presented with 1 month history of progressive shortness of breath and some coughing.   Hospital Course:  1. CAP. Noted on CT scan, she already feels better on empiric IV antibiotics which will be transitioned to oral antibiotics to complete the course. , ,. Negative cultures on discharge.   2. Elevated troponins:  Probably from demand ischemia. Chr.Systolic.CHF EF 45% - Cardiology on board, chest pain-free, EKG nonacute, on aspirin, ACE, beta blocker and statin for secondary prevention along with Lovenox. Echocardiogram noted.ardiology recommended outpatient non invasive testing after pneumonia is resolved.   3. Essential hypertension. Continue ACE inhibitor, beta blocker, as per cardiology recommendations.  4. Dyslipidemia. On statin continue.  5. Mediastinal fullness noted on CT scan chest. Needs repeat CT scan of the chest in 3  months.   Procedures:  none  Consultations:  Cardiology.  Discharge Exam: Filed Vitals:   08/08/15 0547 08/08/15 1418  BP: 122/54 120/54  Pulse: 64 63  Temp: 98.3 F (36.8 C) 97.8 F (36.6 C)  Resp: 20 18    General: ALERT AFEBRILE comfortable.  Cardiovascular: s1s2 Respiratory: ctab  Discharge Instructions   Discharge Instructions    Diet - low sodium heart healthy    Complete by:  As directed      Discharge instructions    Complete by:  As directed   Needs repeat CT scan of the chest in 3 months FOLLOW up with PCP in 1 to2 weeks.  plase obtain a repeat CXR in 4 weeks to evaluate for resolution of the pneumonia.  Please follow up with cardiologist as recommended for an outpatient stress test.          Discharge Medication List as of 08/08/2015  1:44 PM    START taking these medications   Details  feeding supplement (BOOST / RESOURCE BREEZE) LIQD Take 1 Container by mouth 3 (three) times daily between meals., Starting 08/08/2015, Until Discontinued, No Print    metoprolol (LOPRESSOR) 50 MG tablet Take 1 tablet (50 mg total) by mouth 2 (two) times daily., Starting 08/08/2015, Until Discontinued, Print      CONTINUE these medications which have CHANGED   Details  levofloxacin (LEVAQUIN) 500 MG tablet Take 1 tablet (500 mg total) by mouth daily., Starting 08/08/2015, Until Discontinued, Print      CONTINUE these medications which have NOT CHANGED   Details  albuterol (PROVENTIL HFA;VENTOLIN HFA) 108 (90 BASE) MCG/ACT inhaler Inhale 2 puffs  into the lungs every 4 (four) hours as needed for wheezing or shortness of breath., Starting 07/07/2015, Until Discontinued, Print    alendronate (FOSAMAX) 70 MG tablet Take 70 mg by mouth once a week. Take with a full glass of water on an empty stomach., Until Discontinued, Historical Med    aspirin 325 MG tablet Take 1 tablet (325 mg total) by mouth daily., Starting 10/25/2014, Until Discontinued, No Print    benazepril  (LOTENSIN) 20 MG tablet Take 20 mg by mouth daily.  , Until Discontinued, Historical Med    Calcium Citrate-Vitamin D (CITRACAL PETITES/VITAMIN D PO) Take 1 tablet by mouth daily. , Until Discontinued, Historical Med    Fluticasone Furoate-Vilanterol (BREO ELLIPTA) 100-25 MCG/INH AEPB Inhale 1 puff into the lungs daily., Until Discontinued, Historical Med    folic acid (FOLVITE) 1 MG tablet Take 1 mg by mouth daily., Until Discontinued, Historical Med    HYDROcodone-acetaminophen (NORCO/VICODIN) 5-325 MG per tablet Take 1 tablet by mouth 2 (two) times daily. , Starting 11/24/2014, Until Discontinued, Historical Med    InFLIXimab (REMICADE IV) Inject 200 mg into the vein every 8 (eight) weeks. , Until Discontinued, Historical Med    methotrexate 2.5 MG tablet Take 15 mg by mouth once a week. , Until Discontinued, Historical Med    Multiple Vitamin (MULTIVITAMIN PO) Take 1 capsule by mouth daily. , Until Discontinued, Historical Med    simvastatin (ZOCOR) 10 MG tablet Take 1 tablet (10 mg total) by mouth daily at 6 PM., Starting 10/25/2014, Until Discontinued, Print       No Known Allergies Follow-up Information    Follow up with [facility].   Specialty:  Home Health Services   Why:  HHPT   Contact information:   [place] [address] [phone]       Follow up with Minus Breeding, MD. Schedule an appointment as soon as possible for a visit in 4 weeks.   Specialty:  Cardiology   Contact information:   [address] [place] [address] [phone]       Follow up with Haywood Pao, MD. Schedule an appointment as soon as possible for a visit in 1 week.   Specialty:  Internal Medicine   Why:  needs repeat CT chest in 3 months for reevaluation.    Contact information:   [address] [place]  [address] [phone]        The results of significant diagnostics from this hospitalization (including imaging, microbiology,  ancillary and laboratory) are listed below for reference.    Significant Diagnostic Studies: Dg Chest 2 View  08/04/2015  CLINICAL DATA:  Progressive shortness of breath and productive cough. EXAM: CHEST  2 VIEW COMPARISON:  07/07/2015 FINDINGS: There is a faint peripheral infiltrate at the left lung base. There is prominent peribronchial thickening bilaterally. No effusions. There is tortuosity and calcification of the thoracic aorta. Heart size is at the upper limits are normal. No acute osseous abnormality. IMPRESSION: New faint peripheral infiltrate at the left lung base. Prominent bronchitic changes. Aortic atherosclerosis. Electronically Signed   By: Lorriane Shire M.D.   On: 08/04/2015 15:35   Ct Angio Chest Pe W/cm &/or Wo Cm  08/04/2015  CLINICAL DATA:  Progressive shortness of breath for 1 week. Productive cough. EXAM: CT ANGIOGRAPHY CHEST WITH CONTRAST TECHNIQUE: Multidetector CT imaging of the chest was performed using the standard protocol during bolus administration of intravenous contrast. Multiplanar CT image reconstructions and MIPs were obtained  to evaluate the vascular anatomy. CONTRAST:  20m OMNIPAQUE IOHEXOL 350 MG/ML SOLN COMPARISON:  Chest radiograph earlier this day. FINDINGS: There are no filling defects within the pulmonary arteries to suggest pulmonary embolus. Atherosclerosis throughout the thoracic aorta, no aneurysm. There are coronary artery calcifications. Dense mitral annulus calcifications. No pericardial effusion. No mediastinal or hilar adenopathy. Severe bronchial thickening, most prominent in the lower lobes, left greater than right. Ill-defined linear and patchy opacities in the lingula and left lower lobe. Ill-defined opacities in the posterior upper lobes, left greater than right, some of which appear nodular. Within the paramediastinal left lung apex with a 2.1 x 1.5 cm confluent opacity that abuts the left subclavian artery. Calcified granuloma in the right upper  lobe, image 30. There is a small left pleural effusion. No definite right pleural effusion. Extensive atherosclerosis of the included upper abdominal vasculature. Calcified granuloma in the liver. Subcutaneous lipoma involving the left lateral lower chest wall, measures 6.9 x 3.4 cm. Small to moderate hiatal hernia. Implanted monitoring device in the anterior left chest wall. Advanced multilevel degenerative change throughout the thoracic spine. There are no acute or suspicious osseous abnormalities. Review of the MIP images confirms the above findings. IMPRESSION: 1. No pulmonary embolus. 2. Severe bronchial thickening with multifocal patchy, confluent, and linear opacities throughout all lobes both lungs, left greater than right. Findings are most significant in the left lung base involving the lingula and lower lobe. Suspect bronchitis with multifocal pneumonia, however some of these have a nodular appearance. The confluent left apical opacity abuts the mediastinum, and attention to this at follow-up is recommended. Recommend follow-up chest CT in 3-4 months. 3. Severe atherosclerosis including the coronary arteries. Electronically Signed   By: MJeb LeveringM.D.   On: 08/04/2015 20:49   Dg Swallowing Func-speech Pathology  08/06/2015  Objective Swallowing Evaluation:   Patient Details Name: Debbie FUGETTMRN: [id number] of Birth: [birth year]Today's Date: 08/06/2015 Time: SLP Start Time (ACUTE ONLY): 1102-SLP Stop Time (ACUTE ONLY): 1120 SLP Time Calculation (min) (ACUTE ONLY): 18 min Past Medical History: '@PMH'$ @ Past Surgical History: Past Surgical History Procedure Laterality Date . Hernia repair     umbilical . Knee surgery Bilateral 1996, 1997   replacements . Cholecystectomy     lap choley . Loop recorder implant N/A 10/25/2014   Procedure: LOOP RECORDER IMPLANT;  Surgeon: SDeboraha Sprang MD;  [facility];  Service: Cardiovascular;  Laterality: N/A; HPI: Pt is a 82y.o. female with past  medical history of hiatal hernia, Rheumatoid arthritis, tobacco abuse, Hypertension; CVA 10/2014, Degenerative disc disease; environmental allergies; Bronchitis; and COPD admitted with 1 month progressive shortness of breath and some coughing. Chest x-ray showed new faint peripheral infiltrate left lung base. BSE 10/24/14 recommended regular and thin, coughing pt states is baseline, pt/family opted to initiate po's versus MBS.  No Data Recorded Assessment / Plan / Recommendation CHL IP CLINICAL IMPRESSIONS 08/06/2015 Therapy Diagnosis Mild pharyngeal phase dysphagia Clinical Impression Patient demonstrates mild pharyngeal dysphagia marked by trace base of tongue weakness and decreased coordination resulting in intermittent  delayed swallow initiation and silent deep penetration. Cues for small portions and a slow pace, which allowed for multiple swallows improved safety and only intermittent flash penetration was observed.  Recommend continuation of current diet orders with implementation of safe swallow strategies to reduce risk and meds whole in puree.  SLP to follow briefly for tolerance and education.   Impact on safety and function Mild aspiration risk  CHL IP TREATMENT RECOMMENDATION 08/06/2015 Treatment Recommendations Therapy as outlined in treatment plan below   Prognosis 08/06/2015 Prognosis for Safe Diet Advancement Good Barriers to Reach Goals -- Barriers/Prognosis Comment -- CHL IP DIET RECOMMENDATION 08/06/2015 SLP Diet Recommendations Regular solids;Thin liquid Liquid Administration via Cup;Straw Medication Administration Whole meds with puree Compensations Small sips/bites;Slow rate;Multiple dry swallows after each bite/sip Postural Changes Seated upright at 90 degrees   CHL IP OTHER RECOMMENDATIONS 08/06/2015 Recommended Consults -- Oral Care Recommendations Oral care BID Other Recommendations --   CHL IP FOLLOW UP RECOMMENDATIONS 08/06/2015 Follow up Recommendations None   CHL IP FREQUENCY AND DURATION 08/06/2015  Speech Therapy Frequency (ACUTE ONLY) min 2x/week Treatment Duration 1 week      CHL IP ORAL PHASE 08/06/2015 Oral Phase WFL Oral - Pudding Teaspoon -- Oral - Pudding Cup -- Oral - Honey Teaspoon -- Oral - Honey Cup -- Oral - Nectar Teaspoon -- Oral - Nectar Cup -- Oral - Nectar Straw -- Oral - Thin Teaspoon -- Oral - Thin Cup -- Oral - Thin Straw -- Oral - Puree -- Oral - Mech Soft -- Oral - Regular -- Oral - Multi-Consistency -- Oral - Pill -- Oral Phase - Comment --  CHL IP PHARYNGEAL PHASE 08/06/2015 Pharyngeal Phase Impaired Pharyngeal- Pudding Teaspoon -- Pharyngeal -- Pharyngeal- Pudding Cup -- Pharyngeal -- Pharyngeal- Honey Teaspoon -- Pharyngeal -- Pharyngeal- Honey Cup -- Pharyngeal -- Pharyngeal- Nectar Teaspoon -- Pharyngeal -- Pharyngeal- Nectar Cup -- Pharyngeal -- Pharyngeal- Nectar Straw -- Pharyngeal -- Pharyngeal- Thin Teaspoon -- Pharyngeal -- Pharyngeal- Thin Cup Delayed swallow initiation-vallecula;Penetration/Aspiration during swallow;Trace aspiration;Compensatory strategies attempted (with notebox) Pharyngeal Material enters airway, CONTACTS cords and not ejected out;Material enters airway, remains ABOVE vocal cords then ejected out Pharyngeal- Thin Straw Trace aspiration;Penetration/Aspiration during swallow;Delayed swallow initiation-vallecula;Compensatory strategies attempted (with notebox) Pharyngeal Material enters airway, remains ABOVE vocal cords then ejected out Pharyngeal- Puree -- Pharyngeal -- Pharyngeal- Mechanical Soft -- Pharyngeal -- Pharyngeal- Regular -- Pharyngeal -- Pharyngeal- Multi-consistency -- Pharyngeal -- Pharyngeal- Pill -- Pharyngeal -- Pharyngeal Comment --  CHL IP CERVICAL ESOPHAGEAL PHASE 08/06/2015 Cervical Esophageal Phase Impaired Pudding Teaspoon -- Pudding Cup -- Honey Teaspoon -- Honey Cup -- Nectar Teaspoon -- Nectar Cup -- Nectar Straw -- Thin Teaspoon -- Thin Cup -- Thin Straw -- Puree -- Mechanical Soft -- Regular -- Multi-consistency -- Pill -- Cervical  Esophageal Comment -- Debbie Kramer., CCC-SLP [phone] BOWIE,MELISSA 08/06/2015, 12:03 PM               Microbiology: Recent Results (from the past 240 hour(s))  Culture, Urine     Status: None   Collection Time: 08/04/15  8:25 PM  Result Value Ref Range Status   Specimen Description URINE, CLEAN CATCH  Final   Special Requests NONE  Final   Culture MULTIPLE SPECIES PRESENT, SUGGEST RECOLLECTION  Final   Report Status 08/06/2015 FINAL  Final     Labs: Basic Metabolic Panel:  Recent Labs Lab 08/04/15 1439 08/05/15 0730 08/06/15 0501  NA 135 138 137  K 3.4* 3.8 3.9  CL 97* 100* 103  CO2 '26 26 24  '$ GLUCOSE 140* 89 81  BUN '18 11 9  '$ CREATININE 0.78 0.70 0.49  CALCIUM 8.5* 7.9* 8.1*  MG  --   --  2.0  PHOS  --   --  2.9   Liver Function Tests:  Recent Labs Lab 08/05/15 0730  AST 28  ALT 18  ALKPHOS 63  BILITOT 0.8  PROT 5.9*  ALBUMIN  2.3*   No results for input(s): LIPASE, AMYLASE in the last 168 hours. No results for input(s): AMMONIA in the last 168 hours. CBC:  Recent Labs Lab 08/04/15 1745 08/05/15 0730 08/06/15 0501  WBC 16.6* 10.2 8.7  NEUTROABS  --  8.2*  --   HGB 14.2 12.3 13.4  HCT 42.0 37.3 39.7  MCV 92.9 92.6 92.1  PLT 263 258 291   Cardiac Enzymes:  Recent Labs Lab 08/04/15 1439 08/04/15 2044 08/05/15 0220 08/05/15 0730  TROPONINI 1.43* 1.51* 1.36* 0.94*   BNP: BNP (last 3 results)  Recent Labs  08/04/15 1439  BNP 641.8*    ProBNP (last 3 results) No results for input(s): PROBNP in the last 8760 hours.  CBG: No results for input(s): GLUCAP in the last 168 hours.     SignedHosie Poisson MD  FACP  [facility] 08/09/2015, 10:20 AM

## 2015-08-13 DIAGNOSIS — J440 Chronic obstructive pulmonary disease with acute lower respiratory infection: Secondary | ICD-10-CM | POA: Diagnosis not present

## 2015-08-13 DIAGNOSIS — Z96653 Presence of artificial knee joint, bilateral: Secondary | ICD-10-CM | POA: Diagnosis not present

## 2015-08-13 DIAGNOSIS — M5135 Other intervertebral disc degeneration, thoracolumbar region: Secondary | ICD-10-CM | POA: Diagnosis not present

## 2015-08-13 DIAGNOSIS — J159 Unspecified bacterial pneumonia: Secondary | ICD-10-CM | POA: Diagnosis not present

## 2015-08-13 DIAGNOSIS — M069 Rheumatoid arthritis, unspecified: Secondary | ICD-10-CM | POA: Diagnosis not present

## 2015-08-13 DIAGNOSIS — F17211 Nicotine dependence, cigarettes, in remission: Secondary | ICD-10-CM | POA: Diagnosis not present

## 2015-08-13 DIAGNOSIS — M6281 Muscle weakness (generalized): Secondary | ICD-10-CM | POA: Diagnosis not present

## 2015-08-13 DIAGNOSIS — I10 Essential (primary) hypertension: Secondary | ICD-10-CM | POA: Diagnosis not present

## 2015-08-14 DIAGNOSIS — J159 Unspecified bacterial pneumonia: Secondary | ICD-10-CM | POA: Diagnosis not present

## 2015-08-14 DIAGNOSIS — F17211 Nicotine dependence, cigarettes, in remission: Secondary | ICD-10-CM | POA: Diagnosis not present

## 2015-08-14 DIAGNOSIS — M6281 Muscle weakness (generalized): Secondary | ICD-10-CM | POA: Diagnosis not present

## 2015-08-14 DIAGNOSIS — I10 Essential (primary) hypertension: Secondary | ICD-10-CM | POA: Diagnosis not present

## 2015-08-14 DIAGNOSIS — J440 Chronic obstructive pulmonary disease with acute lower respiratory infection: Secondary | ICD-10-CM | POA: Diagnosis not present

## 2015-08-14 DIAGNOSIS — M069 Rheumatoid arthritis, unspecified: Secondary | ICD-10-CM | POA: Diagnosis not present

## 2015-08-16 DIAGNOSIS — M6281 Muscle weakness (generalized): Secondary | ICD-10-CM | POA: Diagnosis not present

## 2015-08-16 DIAGNOSIS — J159 Unspecified bacterial pneumonia: Secondary | ICD-10-CM | POA: Diagnosis not present

## 2015-08-16 DIAGNOSIS — M069 Rheumatoid arthritis, unspecified: Secondary | ICD-10-CM | POA: Diagnosis not present

## 2015-08-16 DIAGNOSIS — F17211 Nicotine dependence, cigarettes, in remission: Secondary | ICD-10-CM | POA: Diagnosis not present

## 2015-08-16 DIAGNOSIS — J440 Chronic obstructive pulmonary disease with acute lower respiratory infection: Secondary | ICD-10-CM | POA: Diagnosis not present

## 2015-08-16 DIAGNOSIS — I10 Essential (primary) hypertension: Secondary | ICD-10-CM | POA: Diagnosis not present

## 2015-08-19 DIAGNOSIS — J159 Unspecified bacterial pneumonia: Secondary | ICD-10-CM | POA: Diagnosis not present

## 2015-08-19 DIAGNOSIS — F17211 Nicotine dependence, cigarettes, in remission: Secondary | ICD-10-CM | POA: Diagnosis not present

## 2015-08-19 DIAGNOSIS — J440 Chronic obstructive pulmonary disease with acute lower respiratory infection: Secondary | ICD-10-CM | POA: Diagnosis not present

## 2015-08-19 DIAGNOSIS — M6281 Muscle weakness (generalized): Secondary | ICD-10-CM | POA: Diagnosis not present

## 2015-08-19 DIAGNOSIS — M069 Rheumatoid arthritis, unspecified: Secondary | ICD-10-CM | POA: Diagnosis not present

## 2015-08-19 DIAGNOSIS — I10 Essential (primary) hypertension: Secondary | ICD-10-CM | POA: Diagnosis not present

## 2015-08-20 DIAGNOSIS — J159 Unspecified bacterial pneumonia: Secondary | ICD-10-CM | POA: Diagnosis not present

## 2015-08-20 DIAGNOSIS — M6281 Muscle weakness (generalized): Secondary | ICD-10-CM | POA: Diagnosis not present

## 2015-08-20 DIAGNOSIS — J440 Chronic obstructive pulmonary disease with acute lower respiratory infection: Secondary | ICD-10-CM | POA: Diagnosis not present

## 2015-08-20 DIAGNOSIS — M069 Rheumatoid arthritis, unspecified: Secondary | ICD-10-CM | POA: Diagnosis not present

## 2015-08-20 DIAGNOSIS — F17211 Nicotine dependence, cigarettes, in remission: Secondary | ICD-10-CM | POA: Diagnosis not present

## 2015-08-20 DIAGNOSIS — I10 Essential (primary) hypertension: Secondary | ICD-10-CM | POA: Diagnosis not present

## 2015-08-21 ENCOUNTER — Ambulatory Visit (INDEPENDENT_AMBULATORY_CARE_PROVIDER_SITE_OTHER): Payer: Medicare Other | Admitting: *Deleted

## 2015-08-21 DIAGNOSIS — M6281 Muscle weakness (generalized): Secondary | ICD-10-CM | POA: Diagnosis not present

## 2015-08-21 DIAGNOSIS — M069 Rheumatoid arthritis, unspecified: Secondary | ICD-10-CM | POA: Diagnosis not present

## 2015-08-21 DIAGNOSIS — I639 Cerebral infarction, unspecified: Secondary | ICD-10-CM | POA: Diagnosis not present

## 2015-08-21 DIAGNOSIS — F17211 Nicotine dependence, cigarettes, in remission: Secondary | ICD-10-CM | POA: Diagnosis not present

## 2015-08-21 DIAGNOSIS — J440 Chronic obstructive pulmonary disease with acute lower respiratory infection: Secondary | ICD-10-CM | POA: Diagnosis not present

## 2015-08-21 DIAGNOSIS — I10 Essential (primary) hypertension: Secondary | ICD-10-CM | POA: Diagnosis not present

## 2015-08-21 DIAGNOSIS — J159 Unspecified bacterial pneumonia: Secondary | ICD-10-CM | POA: Diagnosis not present

## 2015-08-22 NOTE — Progress Notes (Signed)
Carelink Summary Report / Loop Recorder 

## 2015-08-23 ENCOUNTER — Other Ambulatory Visit: Payer: Self-pay

## 2015-08-23 DIAGNOSIS — J440 Chronic obstructive pulmonary disease with acute lower respiratory infection: Secondary | ICD-10-CM | POA: Diagnosis not present

## 2015-08-23 DIAGNOSIS — J189 Pneumonia, unspecified organism: Secondary | ICD-10-CM

## 2015-08-23 DIAGNOSIS — F17211 Nicotine dependence, cigarettes, in remission: Secondary | ICD-10-CM | POA: Diagnosis not present

## 2015-08-23 DIAGNOSIS — M6281 Muscle weakness (generalized): Secondary | ICD-10-CM | POA: Diagnosis not present

## 2015-08-23 DIAGNOSIS — I10 Essential (primary) hypertension: Secondary | ICD-10-CM | POA: Diagnosis not present

## 2015-08-23 DIAGNOSIS — J159 Unspecified bacterial pneumonia: Secondary | ICD-10-CM | POA: Diagnosis not present

## 2015-08-23 DIAGNOSIS — M069 Rheumatoid arthritis, unspecified: Secondary | ICD-10-CM | POA: Diagnosis not present

## 2015-08-23 NOTE — Patient Outreach (Signed)
[facility]) Care Management  08/23/2015  Debbie Kramer August 10, 1925 [id number]  Referral date:  08/22/2015 [facility] Pneumonia Program Red Alert Day #2:  Had diarrhea or felt sick to stomach?  YES Patient eligible for [facility] services.   Providers: Primary MD:  Dr. Osborne Casco -  last appt: 08/15/15 and next appt:  11/16/15 Rheumatologist:  Cardiologist:   HH: yes  [place]: Medicare & AARP  Social: Lives in her home with her husband. Daughter also helping with care.  Mobility: Ambulates with cane or walker as needed.  Falls: None  Pain: yes due to osteoarthritis and RA.  Pain medication regime in place.  Transportation:  Daughters  Caregiver: husband and daughters Advanced Directives:  Yes DME: cane, walker  THN conditions:  HTN Emmi Pneumonia Program Admissions: 1 ER visits: 1 Patient confirms error in Emmi report.  States not having any diarrhea or feeling sick.  Patient has completed post hospital follow up appt with Dr. Osborne Casco.   Patient has completed Antibiotic.  Patient reports minimal shortness of breath and fatigue.   States fatigue is about the same as she deals with associated to her arthritis.  Patient states she was not using inhalers until this illness of Pneumonia.   Medications:  Takes more than 10 Co-pay cost issues:  None   Consent: Patient agreed to [facility] services.   Plan:  [facility] Community RN CM referral -TOC triggered from Emmi Pneumonia program  Discharge instructions included  -follow-up with Dr. Minus Breeding - schedule appt as soon as possible for a visit in 4 weeks.  (Patient did not report that she had completed this). -needs repeat CT chest in 3 months for reevaluation.   [place] referral -more than 10 medications  RN CM notified [place] Management Assistant: agreed to services/case opened. RN CM will schedule for next contact call within 2 weeks.  RN CM advised to please notify MD of any changes in condition  prior to scheduled appt's.   RN CM provided contact name and # [phone] or main office # [phone] and 24-hour nurse line # [phone].  RN CM confirmed patient is aware of 911 services for urgent emergency needs.  Mariann Laster, RN, BSN, [facility], [place] Management Care Management Coordinator [phone] Direct [phone] Cell [phone] Office [phone] Fax  Mariann Laster, RN, BSN, [facility], [place] Management Care Management Coordinator [phone] Direct [phone] Cell [phone] Office [phone] Fax

## 2015-08-26 ENCOUNTER — Other Ambulatory Visit: Payer: Self-pay

## 2015-08-26 ENCOUNTER — Other Ambulatory Visit: Payer: Self-pay | Admitting: *Deleted

## 2015-08-26 DIAGNOSIS — J159 Unspecified bacterial pneumonia: Secondary | ICD-10-CM | POA: Diagnosis not present

## 2015-08-26 DIAGNOSIS — F17211 Nicotine dependence, cigarettes, in remission: Secondary | ICD-10-CM | POA: Diagnosis not present

## 2015-08-26 DIAGNOSIS — M069 Rheumatoid arthritis, unspecified: Secondary | ICD-10-CM | POA: Diagnosis not present

## 2015-08-26 DIAGNOSIS — I10 Essential (primary) hypertension: Secondary | ICD-10-CM | POA: Diagnosis not present

## 2015-08-26 DIAGNOSIS — M6281 Muscle weakness (generalized): Secondary | ICD-10-CM | POA: Diagnosis not present

## 2015-08-26 DIAGNOSIS — J440 Chronic obstructive pulmonary disease with acute lower respiratory infection: Secondary | ICD-10-CM | POA: Diagnosis not present

## 2015-08-26 NOTE — Patient Outreach (Addendum)
[facility]) Care Management  08/26/2015  Debbie Kramer [birth year] [id number]   Emmi Pneumonia Red Alert Day #6 Do others at home smoke? Yes  Patient states h/o Smoker x 35 years and quit smoking 08/04/2015.  Second hand smoke: yes; daughter lives in the home and smokes with door closed.  Patient noted to be more short of breath on phone call today; states she was just dressing which made her short of breath.  Patient noted to have congested cough and confirms yellow sputum.  Patient denies any fever.   Plan: RN CM advised patient to notify Primary MD, Dr. Osborne Casco of the above updates and encouraged seeing MD for assessment of change in condition.  RN CM advised patient in goal to prevent a secondary pneumonia or other infection.  RN CM instructed that patient should not be exposed to second hand smoke when preventable.  Encouraged patient to have daughter not smoke inside the home at all and smoke outside.  Discussed health risk associated to smoking and second hand smoke. Patient declined any further smoking cessation education; stating she has a lot of materials recently provided to her.    Referral sent to [facility] and Pharmacy on 08/23/2015 RN CM will update [facility] Community RN CM, Valente David of this update via [employer].   Mariann Laster, RN, BSN, [facility], CCM  [facility] [employer] Management Coordinator [phone] Direct [phone] Cell [phone] Office [phone] Fax

## 2015-08-26 NOTE — Patient Outreach (Signed)
Referral received from telephonic care manager for community care management involvement of member who was recently discharged (1/5) with diagnosis of pneumonia.  Today she states that she is doing "ok".  She states that she already had her follow up appointment with her PCP but continues to have some congestion and shortness of breath intermittently, especially with activity.  Member advised to contact PCP office to schedule another follow up appointment and to re-assess current status.  She states that she does not want to and will just wait because she has a Remicade infusion scheduled for Thursday.  She is fearful that she will be placed back on antibiotics, which will prohibit her from getting her infusion.  She states that she will follow up with her PCP after her infusion.  Member reports that she is receiving services from home health, physical therapy and nursing, but she does not know the agency.  She states that she is taking her medicine as prescribed daily, and that her daughters assist her with managing her care.  Member is agreeable to transition of care calls and home visits, but state that she would like for her daughter to be home for the visit.  She reports that her daughter does not get off work until 330, but is off on Fridays.  Member notified that this care manager will contact her again early next week for an update on her condition and to schedule a home visit.  Member again encouraged to contact PCP office for visit.  Provided member with contact information for this care manager, advised to contact with concerns or questions.  Valente David, BSN, [place] Management  [facility] Care Manager [phone]

## 2015-08-27 NOTE — Patient Outreach (Signed)
[facility]) Care Management  08/27/2015  Debbie Kramer Jul 30, 1926 [id number]   Patient triggered RED on EMMI Pneumonia Dashboard, notification sent to [facility], RN.  Thanks, Ronnell Freshwater. [place], [place] Assistant Phone: [phone] Fax: [phone]

## 2015-08-28 DIAGNOSIS — J440 Chronic obstructive pulmonary disease with acute lower respiratory infection: Secondary | ICD-10-CM | POA: Diagnosis not present

## 2015-08-28 DIAGNOSIS — I10 Essential (primary) hypertension: Secondary | ICD-10-CM | POA: Diagnosis not present

## 2015-08-28 DIAGNOSIS — F17211 Nicotine dependence, cigarettes, in remission: Secondary | ICD-10-CM | POA: Diagnosis not present

## 2015-08-28 DIAGNOSIS — M6281 Muscle weakness (generalized): Secondary | ICD-10-CM | POA: Diagnosis not present

## 2015-08-28 DIAGNOSIS — M069 Rheumatoid arthritis, unspecified: Secondary | ICD-10-CM | POA: Diagnosis not present

## 2015-08-28 DIAGNOSIS — J159 Unspecified bacterial pneumonia: Secondary | ICD-10-CM | POA: Diagnosis not present

## 2015-08-29 DIAGNOSIS — M0589 Other rheumatoid arthritis with rheumatoid factor of multiple sites: Secondary | ICD-10-CM | POA: Diagnosis not present

## 2015-08-30 DIAGNOSIS — I10 Essential (primary) hypertension: Secondary | ICD-10-CM | POA: Diagnosis not present

## 2015-08-30 DIAGNOSIS — M6281 Muscle weakness (generalized): Secondary | ICD-10-CM | POA: Diagnosis not present

## 2015-08-30 DIAGNOSIS — F17211 Nicotine dependence, cigarettes, in remission: Secondary | ICD-10-CM | POA: Diagnosis not present

## 2015-08-30 DIAGNOSIS — J440 Chronic obstructive pulmonary disease with acute lower respiratory infection: Secondary | ICD-10-CM | POA: Diagnosis not present

## 2015-08-30 DIAGNOSIS — J159 Unspecified bacterial pneumonia: Secondary | ICD-10-CM | POA: Diagnosis not present

## 2015-08-30 DIAGNOSIS — M069 Rheumatoid arthritis, unspecified: Secondary | ICD-10-CM | POA: Diagnosis not present

## 2015-09-02 ENCOUNTER — Other Ambulatory Visit: Payer: Self-pay | Admitting: *Deleted

## 2015-09-02 NOTE — Patient Outreach (Signed)
Weekly transition of care call placed to member.  She states that she is feeling better, stating "I feel good.  I was bad for a while, I'm good now."  She states that her congestion that she was feeling last week is now gone, reporting that the remicade treatment helped.  She does still report some coughing with yellow mucus, but denies fever or shortness of breath.  She reports that her therapy with home physical therapy and nursing ended last week, but that she made a lot of progress.  Member does state that her daughter continues to smoke in the home, suggested she have a conversation about smoking outside instead of in the home.  Member state that she agrees and will speak to her.    She is open to having a home visit, but would like to have her daughter present to put her dog away (the dog bites).  Initial home visit scheduled for next Friday when her daughter is off work.  Encouraged to contact this care manager with any concerns.  Denies questions at this time.  Valente David, BSN, [place] Management  [facility] Care Manager [phone]

## 2015-09-04 ENCOUNTER — Other Ambulatory Visit: Payer: Self-pay | Admitting: Pharmacist

## 2015-09-04 DIAGNOSIS — J159 Unspecified bacterial pneumonia: Secondary | ICD-10-CM | POA: Diagnosis not present

## 2015-09-04 DIAGNOSIS — M069 Rheumatoid arthritis, unspecified: Secondary | ICD-10-CM | POA: Diagnosis not present

## 2015-09-04 DIAGNOSIS — M6281 Muscle weakness (generalized): Secondary | ICD-10-CM | POA: Diagnosis not present

## 2015-09-04 DIAGNOSIS — F17211 Nicotine dependence, cigarettes, in remission: Secondary | ICD-10-CM | POA: Diagnosis not present

## 2015-09-04 DIAGNOSIS — I10 Essential (primary) hypertension: Secondary | ICD-10-CM | POA: Diagnosis not present

## 2015-09-04 DIAGNOSIS — J440 Chronic obstructive pulmonary disease with acute lower respiratory infection: Secondary | ICD-10-CM | POA: Diagnosis not present

## 2015-09-04 NOTE — Patient Outreach (Signed)
[facility]) Care Management  [place]   09/04/2015  Debbie Kramer [birth year] [id number]  Subjective: Debbie Kramer is a 82 y.o. female referred to pharmacy for medication review. In order to perform this evaluation, reviewed patient's medication, allergy and problem lists within EPIC.  Objective:   Current Medications: Current Outpatient Prescriptions  Medication Sig Dispense Refill  . albuterol (PROVENTIL HFA;VENTOLIN HFA) 108 (90 BASE) MCG/ACT inhaler Inhale 2 puffs into the lungs every 4 (four) hours as needed for wheezing or shortness of breath. 1 Inhaler 0  . alendronate (FOSAMAX) 70 MG tablet Take 70 mg by mouth once a week. Take with a full glass of water on an empty stomach.    Marland Kitchen aspirin 325 MG tablet Take 1 tablet (325 mg total) by mouth daily.    . benazepril (LOTENSIN) 20 MG tablet Take 20 mg by mouth daily.      . Calcium Citrate-Vitamin D (CITRACAL PETITES/VITAMIN D PO) Take 1 tablet by mouth daily.     . feeding supplement (BOOST / RESOURCE BREEZE) LIQD Take 1 Container by mouth 3 (three) times daily between meals.  0  . Fluticasone Furoate-Vilanterol (BREO ELLIPTA) 100-25 MCG/INH AEPB Inhale 1 puff into the lungs daily.    . folic acid (FOLVITE) 1 MG tablet Take 1 mg by mouth daily.    Marland Kitchen HYDROcodone-acetaminophen (NORCO/VICODIN) 5-325 MG per tablet Take 1 tablet by mouth 2 (two) times daily.     . InFLIXimab (REMICADE IV) Inject 200 mg into the vein every 8 (eight) weeks.     Marland Kitchen levofloxacin (LEVAQUIN) 500 MG tablet Take 1 tablet (500 mg total) by mouth daily. (Patient not taking: Reported on 08/23/2015) 7 tablet 0  . methotrexate 2.5 MG tablet Take 15 mg by mouth once a week.     . metoprolol (LOPRESSOR) 50 MG tablet Take 1 tablet (50 mg total) by mouth 2 (two) times daily. (Patient not taking: Reported on 08/26/2015) 60 tablet 1  . Multiple Vitamin (MULTIVITAMIN PO) Take 1 capsule by mouth daily.     . simvastatin (ZOCOR) 10 MG tablet Take 1  tablet (10 mg total) by mouth daily at 6 PM. 30 tablet 0   No current facility-administered medications for this visit.    Assessment:    Drugs sorted by system:  Cardiovascular: aspirin, benazepril, simvastatin  Pulmonary/Allergy: albuterol, Breo  Rheumatoid Arthritis: Remicade, methotrexate  Pain: Norco  Vitamins/Minerals: Calcium + Vitamin D, folic acid, multivitamin  Miscellaneous: alendronate   Duplications in therapy: none noted  Gaps in therapy: none noted  Medications to avoid in the elderly: Norco - opioids in patients with a history of falls, but per 08/23/15 fall screening in EPIC, patient denies any falls in the past year.  Drug interactions: No significant interactions noted.   Plan:  Will close pharmacy episode for now. Will send an InBasket message to [place] letting her know that I am closing the episode.  Harlow Asa, PharmD Clinical Pharmacist [place] [facility] [phone]

## 2015-09-05 DIAGNOSIS — I129 Hypertensive chronic kidney disease with stage 1 through stage 4 chronic kidney disease, or unspecified chronic kidney disease: Secondary | ICD-10-CM | POA: Diagnosis not present

## 2015-09-05 DIAGNOSIS — J159 Unspecified bacterial pneumonia: Secondary | ICD-10-CM | POA: Diagnosis not present

## 2015-09-05 DIAGNOSIS — G8929 Other chronic pain: Secondary | ICD-10-CM | POA: Diagnosis not present

## 2015-09-05 DIAGNOSIS — J441 Chronic obstructive pulmonary disease with (acute) exacerbation: Secondary | ICD-10-CM | POA: Diagnosis not present

## 2015-09-05 DIAGNOSIS — M069 Rheumatoid arthritis, unspecified: Secondary | ICD-10-CM | POA: Diagnosis not present

## 2015-09-05 DIAGNOSIS — E46 Unspecified protein-calorie malnutrition: Secondary | ICD-10-CM | POA: Diagnosis not present

## 2015-09-05 DIAGNOSIS — K5909 Other constipation: Secondary | ICD-10-CM | POA: Diagnosis not present

## 2015-09-05 DIAGNOSIS — I4581 Long QT syndrome: Secondary | ICD-10-CM | POA: Diagnosis not present

## 2015-09-06 ENCOUNTER — Ambulatory Visit (INDEPENDENT_AMBULATORY_CARE_PROVIDER_SITE_OTHER): Payer: Medicare Other | Admitting: Physician Assistant

## 2015-09-06 ENCOUNTER — Encounter: Payer: Self-pay | Admitting: Physician Assistant

## 2015-09-06 VITALS — BP 108/64 | HR 60 | Ht 60.0 in | Wt 125.7 lb

## 2015-09-06 DIAGNOSIS — R778 Other specified abnormalities of plasma proteins: Secondary | ICD-10-CM

## 2015-09-06 DIAGNOSIS — I639 Cerebral infarction, unspecified: Secondary | ICD-10-CM

## 2015-09-06 DIAGNOSIS — R7989 Other specified abnormal findings of blood chemistry: Secondary | ICD-10-CM

## 2015-09-06 DIAGNOSIS — I10 Essential (primary) hypertension: Secondary | ICD-10-CM

## 2015-09-06 DIAGNOSIS — Z720 Tobacco use: Secondary | ICD-10-CM

## 2015-09-06 NOTE — Patient Instructions (Signed)
Your physician recommends that you schedule a follow-up appointment in: 3 months with Dr Percival Spanish. No changes were made today in your therapy.

## 2015-09-06 NOTE — Progress Notes (Signed)
Patient ID: Debbie Kramer, female   DOB: 09/12/1925, 82 y.o.   MRN: [id number]    Date:  09/06/2015   Debbie Kramer, DOB 10/14/1925, MRN [id number]  PCP:  Haywood Pao, MD  Primary Cardiologist:  [facility]  Chief Complaint  Patient presents with  . Follow-up    4 week post hosp//pt c/o SOB--COPD, and just over Pneumonia     History of Present Illness: Debbie Kramer is a 82 y.o. female history of ongoing tobacco abuse, COPD, essential hypertension, new, likely ischemic cardiomyopathy. She was recently hospitalized with pneumonia and had a troponin elevation of 1.51 which was considered from demand ischemia.  Atherosclerosis was seen on CT. EKG non ischemic.  Echo 08/05/15: EF 40-45%, akinesis of the mid-apicalanteroseptal, anterior, inferior, and apical myocardium. Suggestive of Takotsubo cardiomyopathy or ischemic etiology. G1DD. Mild MR. Mild to mod LVH.   Debbie Kramer presents for posthospital follow-up. She reports doing well and has no complaints. She is DO NOT RESUSCITATE by her account and her daughters has the form. I recommended get posted on refrigerator at home.  He is not interested in having a stress test.  The patient currently denies nausea, vomiting, fever, chest pain, shortness of breath, orthopnea, dizziness, PND, cough, congestion, abdominal pain, hematochezia, melena, lower extremity edema, claudication.  Wt Readings from Last 3 Encounters:  09/06/15 125 lb 11.2 oz (57.017 kg)  08/04/15 132 lb 11.5 oz (60.2 kg)  07/07/15 138 lb 4 oz (62.71 kg)     Past Medical History  Diagnosis Date  . Rheumatoid arthritis(714.0)   . Hypertension   . Degenerative disc disease   . Bronchitis   . COPD (chronic obstructive pulmonary disease) ([place])     Current Outpatient Prescriptions  Medication Sig Dispense Refill  . albuterol (PROVENTIL HFA;VENTOLIN HFA) 108 (90 BASE) MCG/ACT inhaler Inhale 2 puffs into the lungs every 4 (four) hours as needed for wheezing or  shortness of breath. 1 Inhaler 0  . alendronate (FOSAMAX) 70 MG tablet Take 70 mg by mouth once a week. Take with a full glass of water on an empty stomach.    Marland Kitchen aspirin 325 MG tablet Take 1 tablet (325 mg total) by mouth daily.    . benazepril (LOTENSIN) 20 MG tablet Take 20 mg by mouth daily.      . Calcium Citrate-Vitamin D (CITRACAL PETITES/VITAMIN D PO) Take 1 tablet by mouth daily.     . feeding supplement (BOOST / RESOURCE BREEZE) LIQD Take 1 Container by mouth 3 (three) times daily between meals.  0  . Fluticasone Furoate-Vilanterol (BREO ELLIPTA) 100-25 MCG/INH AEPB Inhale 1 puff into the lungs daily.    . folic acid (FOLVITE) 1 MG tablet Take 1 mg by mouth daily.    Marland Kitchen HYDROcodone-acetaminophen (NORCO/VICODIN) 5-325 MG per tablet Take 1 tablet by mouth 2 (two) times daily.     . InFLIXimab (REMICADE IV) Inject 200 mg into the vein every 8 (eight) weeks.     . methotrexate 2.5 MG tablet Take 15 mg by mouth once a week.     . metoprolol (LOPRESSOR) 50 MG tablet Take 1 tablet (50 mg total) by mouth 2 (two) times daily. 60 tablet 1  . Multiple Vitamin (MULTIVITAMIN PO) Take 1 capsule by mouth daily.     . simvastatin (ZOCOR) 10 MG tablet Take 1 tablet (10 mg total) by mouth daily at 6 PM. 30 tablet 0   No current facility-administered medications for this visit.  Allergies:   No Known Allergies  Social History:  The patient  reports that she quit smoking about 4 weeks ago. Her smoking use included Cigarettes. She smoked 0.00 packs per day for 50 years. She does not have any smokeless tobacco history on file. She reports that she does not drink alcohol or use illicit drugs.   Family history:   Family History  Problem Relation Age of Onset  . Stroke Father   . Cancer Father     lung  . Cancer Mother   . Diabetes Brother   . Stroke Maternal Grandmother     ROS:  Please see the history of present illness.  All other systems reviewed and negative.   PHYSICAL EXAM: VS:  BP 108/64  mmHg  Pulse 60  Ht 5' (1.524 m)  Wt 125 lb 11.2 oz (57.017 kg)  BMI 24.55 kg/m2  LMP  (LMP Unknown) Well nourished, well developed, in no acute distressShe walks with a cane. HEENT: Pupils are equal round react to light accommodation extraocular movements are intact.  Neck: no JVDNo cervical lymphadenopathy. Cardiac: Regular rate and rhythm without murmurs rubs or gallops. Lungs:  clear to auscultation on the right mild crackles on the left, no wheezing, rhonchi Abd: soft, nontender, positive bowel sounds all quadrants, no hepatosplenomegaly Ext: no lower extremity edema.  2+ radial and 1+ dorsalis pedis pulses. Skin: warm and dry Neuro:  Grossly normal     ASSESSMENT AND PLAN:  Problem List Items Addressed This Visit    Tobacco abuse - Primary (Chronic)   Essential hypertension   Elevated troponin      Debbie Kramer appears to be doing very well given her age. She has no particular complaints. Her blood pressures controlled. She continues to sneak cigarettes which at this point I don't think it's worth stopping. She had an elevated troponin up to 1.51 recently in the hospital to set her up for a stress test but she has declined and does not want it.  Should she change her mind, I asked her to call the office.  She is DO NOT RESUSCITATE and I recommend she get her form from her daughter and keep it in her house.

## 2015-09-12 ENCOUNTER — Ambulatory Visit: Payer: Medicare Other | Admitting: *Deleted

## 2015-09-13 ENCOUNTER — Ambulatory Visit: Payer: Self-pay | Admitting: *Deleted

## 2015-09-14 DIAGNOSIS — M069 Rheumatoid arthritis, unspecified: Secondary | ICD-10-CM | POA: Diagnosis not present

## 2015-09-14 DIAGNOSIS — F17211 Nicotine dependence, cigarettes, in remission: Secondary | ICD-10-CM | POA: Diagnosis not present

## 2015-09-14 DIAGNOSIS — J440 Chronic obstructive pulmonary disease with acute lower respiratory infection: Secondary | ICD-10-CM | POA: Diagnosis not present

## 2015-09-14 DIAGNOSIS — I10 Essential (primary) hypertension: Secondary | ICD-10-CM | POA: Diagnosis not present

## 2015-09-14 DIAGNOSIS — J159 Unspecified bacterial pneumonia: Secondary | ICD-10-CM | POA: Diagnosis not present

## 2015-09-14 DIAGNOSIS — M6281 Muscle weakness (generalized): Secondary | ICD-10-CM | POA: Diagnosis not present

## 2015-09-20 ENCOUNTER — Encounter: Payer: Self-pay | Admitting: *Deleted

## 2015-09-20 ENCOUNTER — Other Ambulatory Visit: Payer: Self-pay | Admitting: *Deleted

## 2015-09-20 ENCOUNTER — Ambulatory Visit (INDEPENDENT_AMBULATORY_CARE_PROVIDER_SITE_OTHER): Payer: Medicare Other | Admitting: *Deleted

## 2015-09-20 DIAGNOSIS — I639 Cerebral infarction, unspecified: Secondary | ICD-10-CM

## 2015-09-20 NOTE — Patient Outreach (Signed)
[facility]) Care Management   09/20/2015  MARYKATHRYN CARBONI 14-May-1926 [id number]  ANAIYA WISINSKI is an 82 y.o. female  Subjective:   Member states that she "feeling better."  She states that she still have a cough at times, but it is nonproductive.  She denies shortness of breath or chest discomfort.  She states that she takes her medications as prescribed with the help of her daughter who is a med Designer, multimedia at a local assisted living facility.  She denies any questions regarding administration.    Objective:   Review of Systems  Constitutional: Negative.   HENT: Negative.   Eyes: Negative.   Respiratory: Positive for cough.   Cardiovascular: Negative.   Gastrointestinal: Negative.   Genitourinary: Negative.   Musculoskeletal: Negative.   Skin: Negative.   Neurological: Negative.   Endo/Heme/Allergies: Negative.   Psychiatric/Behavioral: Negative.     Physical Exam  Constitutional: She is oriented to person, place, and time. She appears well-developed and well-nourished.  Neck: Normal range of motion.  Cardiovascular: Normal rate, regular rhythm and normal heart sounds.   Respiratory: Effort normal and breath sounds normal.  GI: Soft. Bowel sounds are normal.  Musculoskeletal: Normal range of motion.  Neurological: She is alert and oriented to person, place, and time.  Skin: Skin is warm and dry.    BP 118/68 mmHg  Pulse 56  Ht 1.524 m (5')  Wt 127 lb (57.607 kg)  BMI 24.80 kg/m2  SpO2 96%  LMP  (LMP Unknown)   Current Medications:   Current Outpatient Prescriptions  Medication Sig Dispense Refill  . albuterol (PROVENTIL HFA;VENTOLIN HFA) 108 (90 BASE) MCG/ACT inhaler Inhale 2 puffs into the lungs every 4 (four) hours as needed for wheezing or shortness of breath. 1 Inhaler 0  . alendronate (FOSAMAX) 70 MG tablet Take 70 mg by mouth once a week. Take with a full glass of water on an empty stomach.    Marland Kitchen aspirin 325 MG tablet Take 1 tablet (325 mg  total) by mouth daily.    . benazepril (LOTENSIN) 20 MG tablet Take 20 mg by mouth daily.      . Calcium Citrate-Vitamin D (CITRACAL PETITES/VITAMIN D PO) Take 1 tablet by mouth daily.     . feeding supplement (BOOST / RESOURCE BREEZE) LIQD Take 1 Container by mouth 3 (three) times daily between meals.  0  . Fluticasone Furoate-Vilanterol (BREO ELLIPTA) 100-25 MCG/INH AEPB Inhale 1 puff into the lungs daily.    . folic acid (FOLVITE) 1 MG tablet Take 1 mg by mouth daily.    Marland Kitchen HYDROcodone-acetaminophen (NORCO/VICODIN) 5-325 MG per tablet Take 1 tablet by mouth 2 (two) times daily.     . InFLIXimab (REMICADE IV) Inject 200 mg into the vein every 8 (eight) weeks.     . methotrexate 2.5 MG tablet Take 15 mg by mouth once a week.     . metoprolol (LOPRESSOR) 50 MG tablet Take 1 tablet (50 mg total) by mouth 2 (two) times daily. 60 tablet 1  . Multiple Vitamin (MULTIVITAMIN PO) Take 1 capsule by mouth daily.     . simvastatin (ZOCOR) 10 MG tablet Take 1 tablet (10 mg total) by mouth daily at 6 PM. 30 tablet 0   No current facility-administered medications for this visit.    Functional Status:   In your present state of health, do you have any difficulty performing the following activities: 09/20/2015 08/04/2015  Hearing? N N  Vision? N N  Difficulty  concentrating or making decisions? N N  Walking or climbing stairs? N N  Dressing or bathing? N N  Doing errands, shopping? Tempie Donning  Preparing Food and eating ? N -  Using the Toilet? N -  In the past six months, have you accidently leaked urine? N -  Do you have problems with loss of bowel control? N -  Managing your Medications? N -  Managing your Finances? N -  Housekeeping or managing your Housekeeping? Y -    Fall/Depression Screening:    PHQ 2/9 Scores 09/20/2015 08/23/2015  PHQ - 2 Score 0 0   Fall Risk  09/20/2015 08/23/2015  Falls in the past year? No No  Risk for fall due to : - Impaired balance/gait  Risk for fall due to (comments): -  Patient has RA and uses cane or walker as needed.     Assessment:    Initial home visit complete, member's daughter, Otila Kluver, present during visit.  Member has been out of the hospital greater than 30 days, and has followed up with her physician.  She has no further concerns at this time.  She state that she stopped smoking a couple months ago and feels that her breathing is "much better.  Offered to have member involved with a telephonic health coach for ongoing education on COPD, both daughter and member deny the need for further assistance.  Informed that is the member's status change in the future and assistance is needed to contact the [facility] office.  They both verbalize understanding.  Plan:   Will notify physician and care management assistant of case closure.  THN CM Care Plan Problem One        Most Recent Value   Care Plan Problem One  Recent hospitalization   Role Documenting the Problem One  Care Management Coordinator   Care Plan for Problem One  Active   THN Long Term Goal (31-90 days)  Member will not have readmission to the hospital within 31 days after discharge (discharged on 1/5)   [place] Term Goal Start Date  08/09/15   [facility] Long Term Goal Met Date  09/20/15   Interventions for Problem One Long Term Goal  Discussed with member the importance of following discharge instructions, including follow up appointments, medications, diet, and home health involvement, to decrease the risk of readmission   [facility]N CM Short Term Goal #1 (0-30 days)  Member will take all medications as prescribed over the next 4 weeks   [facility]N CM Short Term Goal #1 Start Date  08/26/15   [facility] CM Short Term Goal #1 Met Date  09/20/15   Interventions for Short Term Goal #1  Discussed with member the importance of following discharge instructions, including follow up appointments, medications, diet, and home health involvement, to decrease the risk of readmission   [facility] CM Short Term Goal #2 (0-30 days)  Member will be  able to verbalize COPD action plan within the next 4 weeks   [facility]N CM Short Term Goal #2 Start Date  08/26/15   [facility] CM Short Term Goal #2 Met Date  09/20/15   Interventions for Short Term Goal #2  Discussed the COPD action plan and zones with member, she reports that she does have information provided from hospital     Valente David, BSN, [place] Manager [phone]

## 2015-09-21 ENCOUNTER — Encounter: Payer: Self-pay | Admitting: *Deleted

## 2015-09-23 NOTE — Progress Notes (Signed)
Carelink Summary Report / Loop Recorder 

## 2015-09-24 ENCOUNTER — Encounter: Payer: Self-pay | Admitting: *Deleted

## 2015-09-30 DIAGNOSIS — M5136 Other intervertebral disc degeneration, lumbar region: Secondary | ICD-10-CM | POA: Diagnosis not present

## 2015-09-30 DIAGNOSIS — M0589 Other rheumatoid arthritis with rheumatoid factor of multiple sites: Secondary | ICD-10-CM | POA: Diagnosis not present

## 2015-09-30 DIAGNOSIS — M810 Age-related osteoporosis without current pathological fracture: Secondary | ICD-10-CM | POA: Diagnosis not present

## 2015-09-30 DIAGNOSIS — G458 Other transient cerebral ischemic attacks and related syndromes: Secondary | ICD-10-CM | POA: Diagnosis not present

## 2015-09-30 DIAGNOSIS — M150 Primary generalized (osteo)arthritis: Secondary | ICD-10-CM | POA: Diagnosis not present

## 2015-10-09 LAB — CUP PACEART REMOTE DEVICE CHECK: Date Time Interrogation Session: 20170217220522

## 2015-10-09 NOTE — Progress Notes (Signed)
Carelink summary report received. Battery status OK. Normal device function. No new symptom episodes, tachy episodes, brady, or pause episodes. No new AF episodes. Monthly summary reports and ROV/PRN 

## 2015-10-10 LAB — CUP PACEART REMOTE DEVICE CHECK: Date Time Interrogation Session: 20170118213548

## 2015-10-21 ENCOUNTER — Ambulatory Visit (INDEPENDENT_AMBULATORY_CARE_PROVIDER_SITE_OTHER): Payer: Medicare Other | Admitting: *Deleted

## 2015-10-21 DIAGNOSIS — I639 Cerebral infarction, unspecified: Secondary | ICD-10-CM | POA: Diagnosis not present

## 2015-10-22 NOTE — Progress Notes (Signed)
Carelink Summary Report / Loop Recorder 

## 2015-10-24 DIAGNOSIS — M0589 Other rheumatoid arthritis with rheumatoid factor of multiple sites: Secondary | ICD-10-CM | POA: Diagnosis not present

## 2015-10-24 DIAGNOSIS — Z79899 Other long term (current) drug therapy: Secondary | ICD-10-CM | POA: Diagnosis not present

## 2015-11-13 DIAGNOSIS — E7800 Pure hypercholesterolemia, unspecified: Secondary | ICD-10-CM | POA: Diagnosis not present

## 2015-11-13 DIAGNOSIS — M069 Rheumatoid arthritis, unspecified: Secondary | ICD-10-CM | POA: Diagnosis not present

## 2015-11-13 DIAGNOSIS — I63411 Cerebral infarction due to embolism of right middle cerebral artery: Secondary | ICD-10-CM | POA: Diagnosis not present

## 2015-11-13 DIAGNOSIS — N182 Chronic kidney disease, stage 2 (mild): Secondary | ICD-10-CM | POA: Diagnosis not present

## 2015-11-13 DIAGNOSIS — R808 Other proteinuria: Secondary | ICD-10-CM | POA: Diagnosis not present

## 2015-11-13 DIAGNOSIS — I10 Essential (primary) hypertension: Secondary | ICD-10-CM | POA: Diagnosis not present

## 2015-11-13 DIAGNOSIS — R7301 Impaired fasting glucose: Secondary | ICD-10-CM | POA: Diagnosis not present

## 2015-11-13 DIAGNOSIS — M810 Age-related osteoporosis without current pathological fracture: Secondary | ICD-10-CM | POA: Diagnosis not present

## 2015-11-19 ENCOUNTER — Ambulatory Visit (INDEPENDENT_AMBULATORY_CARE_PROVIDER_SITE_OTHER): Payer: Medicare Other | Admitting: *Deleted

## 2015-11-19 DIAGNOSIS — I639 Cerebral infarction, unspecified: Secondary | ICD-10-CM | POA: Diagnosis not present

## 2015-11-20 NOTE — Progress Notes (Signed)
Carelink Summary Report / Loop Recorder 

## 2015-12-06 ENCOUNTER — Ambulatory Visit: Payer: Medicare Other | Admitting: Cardiology

## 2015-12-19 ENCOUNTER — Ambulatory Visit (INDEPENDENT_AMBULATORY_CARE_PROVIDER_SITE_OTHER): Payer: Medicare Other | Admitting: *Deleted

## 2015-12-19 DIAGNOSIS — I639 Cerebral infarction, unspecified: Secondary | ICD-10-CM

## 2015-12-19 DIAGNOSIS — M0589 Other rheumatoid arthritis with rheumatoid factor of multiple sites: Secondary | ICD-10-CM | POA: Diagnosis not present

## 2015-12-19 DIAGNOSIS — Z79899 Other long term (current) drug therapy: Secondary | ICD-10-CM | POA: Diagnosis not present

## 2015-12-20 NOTE — Progress Notes (Signed)
Carelink Summary Report / Loop Recorder 

## 2015-12-27 DIAGNOSIS — G458 Other transient cerebral ischemic attacks and related syndromes: Secondary | ICD-10-CM | POA: Diagnosis not present

## 2015-12-27 DIAGNOSIS — M0589 Other rheumatoid arthritis with rheumatoid factor of multiple sites: Secondary | ICD-10-CM | POA: Diagnosis not present

## 2015-12-27 DIAGNOSIS — M810 Age-related osteoporosis without current pathological fracture: Secondary | ICD-10-CM | POA: Diagnosis not present

## 2015-12-27 DIAGNOSIS — M5136 Other intervertebral disc degeneration, lumbar region: Secondary | ICD-10-CM | POA: Diagnosis not present

## 2015-12-27 DIAGNOSIS — M150 Primary generalized (osteo)arthritis: Secondary | ICD-10-CM | POA: Diagnosis not present

## 2015-12-28 LAB — CUP PACEART REMOTE DEVICE CHECK: Date Time Interrogation Session: 20170319220636

## 2015-12-28 NOTE — Progress Notes (Signed)
Carelink summary report received. Battery status OK. Normal device function. No new symptom episodes, tachy episodes, brady, or pause episodes. No new AF episodes. Monthly summary reports and ROV/PRN 

## 2015-12-30 LAB — CUP PACEART REMOTE DEVICE CHECK: MDC IDC SESS DTM: 20170418223535

## 2015-12-30 NOTE — Progress Notes (Signed)
Carelink summary report received. Battery status OK. Normal device function. No new symptom episodes, tachy episodes, brady, or pause episodes. No new AF episodes. Monthly summary reports and ROV/PRN 

## 2016-01-20 ENCOUNTER — Ambulatory Visit (INDEPENDENT_AMBULATORY_CARE_PROVIDER_SITE_OTHER): Payer: Medicare Other | Admitting: *Deleted

## 2016-01-20 DIAGNOSIS — I639 Cerebral infarction, unspecified: Secondary | ICD-10-CM | POA: Diagnosis not present

## 2016-01-20 NOTE — Progress Notes (Signed)
Carelink Summary Report / Loop Recorder 

## 2016-01-27 LAB — CUP PACEART REMOTE DEVICE CHECK
Date Time Interrogation Session: 20170518230546
Date Time Interrogation Session: 20170617230522

## 2016-02-13 DIAGNOSIS — M0589 Other rheumatoid arthritis with rheumatoid factor of multiple sites: Secondary | ICD-10-CM | POA: Diagnosis not present

## 2016-02-13 DIAGNOSIS — Z79899 Other long term (current) drug therapy: Secondary | ICD-10-CM | POA: Diagnosis not present

## 2016-02-16 ENCOUNTER — Encounter: Payer: Self-pay | Admitting: Internal Medicine

## 2016-02-17 ENCOUNTER — Ambulatory Visit (INDEPENDENT_AMBULATORY_CARE_PROVIDER_SITE_OTHER): Payer: Medicare Other | Admitting: *Deleted

## 2016-02-17 DIAGNOSIS — I639 Cerebral infarction, unspecified: Secondary | ICD-10-CM

## 2016-02-18 NOTE — Progress Notes (Signed)
Carelink Summary Report / Loop Recorder 

## 2016-02-19 ENCOUNTER — Telehealth: Payer: Self-pay | Admitting: *Deleted

## 2016-02-19 DIAGNOSIS — I480 Paroxysmal atrial fibrillation: Secondary | ICD-10-CM | POA: Diagnosis not present

## 2016-02-19 DIAGNOSIS — J441 Chronic obstructive pulmonary disease with (acute) exacerbation: Secondary | ICD-10-CM | POA: Diagnosis not present

## 2016-02-19 DIAGNOSIS — R0609 Other forms of dyspnea: Secondary | ICD-10-CM | POA: Diagnosis not present

## 2016-02-19 DIAGNOSIS — Z6826 Body mass index (BMI) 26.0-26.9, adult: Secondary | ICD-10-CM | POA: Diagnosis not present

## 2016-02-19 NOTE — Telephone Encounter (Signed)
Called patient regarding new AF on LINQ.  Patient made aware of AF episode and is agreeable to an appointment in the AF clinic on 02/26/16 (due to transportation) to discuss Burgin per Dr. Olin Pia recommendation.  Patient is aware of clinic address, phone number, and parking code.  Patient is appreciative of call and denies questions or concerns at this time.

## 2016-02-26 ENCOUNTER — Encounter (HOSPITAL_COMMUNITY): Payer: Self-pay | Admitting: Nurse Practitioner

## 2016-02-26 ENCOUNTER — Other Ambulatory Visit: Payer: Self-pay

## 2016-02-26 ENCOUNTER — Ambulatory Visit (HOSPITAL_COMMUNITY)
Admission: RE | Admit: 2016-02-26 | Discharge: 2016-02-26 | Disposition: A | Discharge status: Home / Self Care | Payer: Medicare Other | Source: Ambulatory Visit | Attending: Nurse Practitioner | Admitting: Nurse Practitioner

## 2016-02-26 VITALS — BP 104/58 | Ht 60.0 in | Wt 130.4 lb

## 2016-02-26 DIAGNOSIS — Z7982 Long term (current) use of aspirin: Secondary | ICD-10-CM | POA: Insufficient documentation

## 2016-02-26 DIAGNOSIS — I480 Paroxysmal atrial fibrillation: Secondary | ICD-10-CM | POA: Diagnosis not present

## 2016-02-26 DIAGNOSIS — R001 Bradycardia, unspecified: Secondary | ICD-10-CM | POA: Diagnosis not present

## 2016-02-26 DIAGNOSIS — Z8673 Personal history of transient ischemic attack (TIA), and cerebral infarction without residual deficits: Secondary | ICD-10-CM | POA: Diagnosis not present

## 2016-02-26 DIAGNOSIS — I10 Essential (primary) hypertension: Secondary | ICD-10-CM | POA: Insufficient documentation

## 2016-02-26 DIAGNOSIS — J449 Chronic obstructive pulmonary disease, unspecified: Secondary | ICD-10-CM | POA: Diagnosis not present

## 2016-02-26 DIAGNOSIS — Z7901 Long term (current) use of anticoagulants: Secondary | ICD-10-CM | POA: Diagnosis not present

## 2016-02-26 DIAGNOSIS — J40 Bronchitis, not specified as acute or chronic: Secondary | ICD-10-CM | POA: Insufficient documentation

## 2016-02-26 DIAGNOSIS — M069 Rheumatoid arthritis, unspecified: Secondary | ICD-10-CM | POA: Insufficient documentation

## 2016-02-26 DIAGNOSIS — Z87891 Personal history of nicotine dependence: Secondary | ICD-10-CM | POA: Diagnosis not present

## 2016-02-26 DIAGNOSIS — I4891 Unspecified atrial fibrillation: Secondary | ICD-10-CM | POA: Insufficient documentation

## 2016-02-26 DIAGNOSIS — I444 Left anterior fascicular block: Secondary | ICD-10-CM | POA: Insufficient documentation

## 2016-02-26 MED ORDER — APIXABAN 2.5 MG PO TABS: 2.5000 mg | ORAL_TABLET | Freq: Two times a day (BID) | ORAL | 0 refills | Status: DC

## 2016-02-26 NOTE — Progress Notes (Signed)
Patient ID: [name], female   DOB: [birth year], 82 y.o.   MRN: [id number]    Primary Care Physician: Debbie Pao, MD Referring Physician: Dr. Lynnea Kramer Debbie Kramer is a 82 y.o. female with a h/o CVA, with implantable loop monitor, 3/16, that  Is in the afib clinic after 6 mins of afib was detected recently,  to initiate anticoagulation. Pt denies any bleeding history, steady on her feet, no falls. Discussion of eliquis/xarelto and coumadin described in detail. She and the daughter would prefer for pt to be on eliquis. Chadsvasc score of 6. Pt was unaware of any irregular heart beat. She had been off BB, due to refill issues, but has been back on couple of weeks now.  Today, she denies symptoms of palpitations, chest pain, shortness of breath, orthopnea, PND, lower extremity edema, dizziness, presyncope, syncope, or neurologic sequela. The patient is tolerating medications without difficulties and is otherwise without complaint today.   Past Medical History:  Diagnosis Date  . Bronchitis   . COPD (chronic obstructive pulmonary disease) ([place])   . Degenerative disc disease   . Hypertension   . Rheumatoid arthritis(714.0)    Past Surgical History:  Procedure Laterality Date  . CHOLECYSTECTOMY     lap choley  . HERNIA REPAIR     umbilical  . KNEE SURGERY Bilateral 1996, 1997   replacements  . LOOP RECORDER IMPLANT N/A 10/25/2014   Procedure: LOOP RECORDER IMPLANT;  Surgeon: Debbie Sprang, MD;  Location: [facility] CATH LAB;  Service: Cardiovascular;  Laterality: N/A;    Current Outpatient Prescriptions  Medication Sig Dispense Refill  . albuterol (PROVENTIL HFA;VENTOLIN HFA) 108 (90 BASE) MCG/ACT inhaler Inhale 2 puffs into the lungs every 4 (four) hours as needed for wheezing or shortness of breath. 1 Inhaler 0  . alendronate (FOSAMAX) 70 MG tablet Take 70 mg by mouth once a week. Take with a full glass of water on an empty stomach.    Marland Kitchen aspirin 325 MG tablet Take 1 tablet (325  mg total) by mouth daily.    . benazepril (LOTENSIN) 20 MG tablet Take 20 mg by mouth daily.      . Calcium Citrate-Vitamin D (CITRACAL PETITES/VITAMIN D PO) Take 1 tablet by mouth daily.     . feeding supplement (BOOST / RESOURCE BREEZE) LIQD Take 1 Container by mouth 3 (three) times daily between meals.  0  . Fluticasone Furoate-Vilanterol (BREO ELLIPTA) 100-25 MCG/INH AEPB Inhale 1 puff into the lungs daily.    . folic acid (FOLVITE) 1 MG tablet Take 1 mg by mouth daily.    Marland Kitchen HYDROcodone-acetaminophen (NORCO/VICODIN) 5-325 MG per tablet Take 1 tablet by mouth 2 (two) times daily.     . InFLIXimab (REMICADE IV) Inject 200 mg into the vein every 8 (eight) weeks.     . methotrexate 2.5 MG tablet Take 15 mg by mouth once a week.     . metoprolol (LOPRESSOR) 50 MG tablet Take 1 tablet (50 mg total) by mouth 2 (two) times daily. 60 tablet 1  . Multiple Vitamin (MULTIVITAMIN PO) Take 1 capsule by mouth daily.     . simvastatin (ZOCOR) 10 MG tablet Take 1 tablet (10 mg total) by mouth daily at 6 PM. 30 tablet 0  . apixaban (ELIQUIS) 2.5 MG TABS tablet Take 1 tablet (2.5 mg total) by mouth 2 (two) times daily. 60 tablet 0   No current facility-administered medications for this encounter.     No Known  Allergies  Social History   Social History  . Marital status: Widowed    Spouse name: N/A  . Number of children: 5  . Years of education: 10   Occupational History  . Retired     retired   Social History Main Topics  . Smoking status: Former Smoker    Packs/day: 0.00    Years: 50.00    Types: Cigarettes    Quit date: 08/04/2015  . Smokeless tobacco: Not on file     Comment: Smokes a few cigarettes per day.    . Alcohol use No  . Drug use: No  . Sexual activity: Not on file   Other Topics Concern  . Not on file   Social History Narrative   Lives with daughter, Debbie Kramer; widowed   Right handed   Caffeine use - none    Family History  Problem Relation Age of Onset  . Stroke Father    . Cancer Father     lung  . Cancer Mother   . Diabetes Brother   . Stroke Maternal Grandmother     ROS- All systems are reviewed and negative except as per the HPI above  Physical Exam: Vitals:   02/26/16 1545  BP: (!) 104/58  Weight: 130 lb 6.4 oz (59.1 kg)  Height: 5' (1.524 m)    GEN- The patient is well appearing, alert and oriented x 3 today.   Head- normocephalic, atraumatic Eyes-  Sclera clear, conjunctiva pink Ears- hearing intact Oropharynx- clear Neck- supple, no JVP Lymph- no cervical lymphadenopathy Lungs- Clear to ausculation bilaterally, normal work of breathing Heart- Regular rate and rhythm, no murmurs, rubs or gallops, PMI not laterally displaced GI- soft, NT, ND, + BS Extremities- no clubbing, cyanosis, or edema MS- no significant deformity or atrophy Skin- no rash or lesion Psych- euthymic mood, full affect Neuro- strength and sensation are intact  EKG-S Brady at 58 bpm, pr int 170 ms, qrs int 96 ms, qtc 424 ms LinQ monitor reviewed Labs reviewed  Assessment and Plan: 1. PAF,on Linq, with prior CVA, chadsvasc score of 6 No bleeding tendencies Bleeding precautions discussed Pt would prefer eliquis and due to age, over age 50 and weight, less than 60 kg, she will require eliquis 2.5 mg bid Stop asa Avoid antiinflammatories Reviewed drug list with PharmD and no drug/drug interactions with current drugs  F/u in one month, sooner if any concerns starting drug  Debbie Kramer, [facility] [address] [place], [place] [id number] [phone]

## 2016-02-26 NOTE — Patient Instructions (Signed)
Your physician has recommended you make the following change in your medication:  1)Eliquis 2.'5mg'$  -- take 1 tablet by mouth twice a day

## 2016-03-18 ENCOUNTER — Ambulatory Visit (INDEPENDENT_AMBULATORY_CARE_PROVIDER_SITE_OTHER): Payer: Medicare Other | Admitting: *Deleted

## 2016-03-18 DIAGNOSIS — I639 Cerebral infarction, unspecified: Secondary | ICD-10-CM | POA: Diagnosis not present

## 2016-03-19 DIAGNOSIS — H1089 Other conjunctivitis: Secondary | ICD-10-CM | POA: Diagnosis not present

## 2016-03-19 LAB — CUP PACEART REMOTE DEVICE CHECK: Date Time Interrogation Session: 20170718000611

## 2016-03-19 NOTE — Progress Notes (Signed)
Carelink Summary Report / Loop Recorder 

## 2016-03-25 ENCOUNTER — Ambulatory Visit (HOSPITAL_COMMUNITY)
Admission: RE | Admit: 2016-03-25 | Discharge: 2016-03-25 | Disposition: A | Discharge status: Home / Self Care | Payer: Medicare Other | Source: Ambulatory Visit | Attending: Nurse Practitioner | Admitting: Nurse Practitioner

## 2016-03-25 ENCOUNTER — Encounter (HOSPITAL_COMMUNITY): Payer: Self-pay | Admitting: Nurse Practitioner

## 2016-03-25 VITALS — BP 96/60 | HR 61 | Ht 60.0 in | Wt 128.0 lb

## 2016-03-25 DIAGNOSIS — J449 Chronic obstructive pulmonary disease, unspecified: Secondary | ICD-10-CM | POA: Diagnosis not present

## 2016-03-25 DIAGNOSIS — Z833 Family history of diabetes mellitus: Secondary | ICD-10-CM | POA: Diagnosis not present

## 2016-03-25 DIAGNOSIS — M069 Rheumatoid arthritis, unspecified: Secondary | ICD-10-CM | POA: Diagnosis not present

## 2016-03-25 DIAGNOSIS — Z823 Family history of stroke: Secondary | ICD-10-CM | POA: Insufficient documentation

## 2016-03-25 DIAGNOSIS — Z79899 Other long term (current) drug therapy: Secondary | ICD-10-CM | POA: Insufficient documentation

## 2016-03-25 DIAGNOSIS — Z8673 Personal history of transient ischemic attack (TIA), and cerebral infarction without residual deficits: Secondary | ICD-10-CM | POA: Insufficient documentation

## 2016-03-25 DIAGNOSIS — I10 Essential (primary) hypertension: Secondary | ICD-10-CM | POA: Diagnosis not present

## 2016-03-25 DIAGNOSIS — Z87891 Personal history of nicotine dependence: Secondary | ICD-10-CM | POA: Insufficient documentation

## 2016-03-25 DIAGNOSIS — Z7901 Long term (current) use of anticoagulants: Secondary | ICD-10-CM | POA: Insufficient documentation

## 2016-03-25 DIAGNOSIS — Z7983 Long term (current) use of bisphosphonates: Secondary | ICD-10-CM | POA: Insufficient documentation

## 2016-03-25 DIAGNOSIS — I480 Paroxysmal atrial fibrillation: Secondary | ICD-10-CM | POA: Diagnosis not present

## 2016-03-25 LAB — BASIC METABOLIC PANEL
ANION GAP: 7 (ref 5–15)
BUN: 24 mg/dL — AB (ref 6–20)
CALCIUM: 9.2 mg/dL (ref 8.9–10.3)
CO2: 26 mmol/L (ref 22–32)
CREATININE: 1.06 mg/dL — AB (ref 0.44–1.00)
Chloride: 103 mmol/L (ref 101–111)
GFR calc Af Amer: 53 mL/min — ABNORMAL LOW (ref >60)
GFR, EST NON AFRICAN AMERICAN: 45 mL/min — AB (ref >60)
GLUCOSE: 90 mg/dL (ref 65–99)
Potassium: 4 mmol/L (ref 3.5–5.1)
Sodium: 136 mmol/L (ref 135–145)

## 2016-03-25 LAB — CBC
HEMATOCRIT: 40.9 % (ref 36.0–46.0)
Hemoglobin: 13 g/dL (ref 12.0–15.0)
MCH: 30.3 pg (ref 26.0–34.0)
MCHC: 31.8 g/dL (ref 30.0–36.0)
MCV: 95.3 fL (ref 78.0–100.0)
PLATELETS: 257 10*3/uL (ref 150–400)
RBC: 4.29 MIL/uL (ref 3.87–5.11)
RDW: 14.8 % (ref 11.5–15.5)
WBC: 7.1 10*3/uL (ref 4.0–10.5)

## 2016-03-25 MED ORDER — METOPROLOL TARTRATE 25 MG PO TABS: 12.5000 mg | ORAL_TABLET | Freq: Two times a day (BID) | ORAL | 2 refills | Status: DC

## 2016-03-25 NOTE — Patient Instructions (Signed)
Your physician has recommended you make the following change in your medication: Change Metoprolol to 25 mg; taking 12.5 mg or half-tablet twice a day  This has been sent to your pharmacy

## 2016-03-25 NOTE — Progress Notes (Signed)
Patient ID: Debbie Kramer, female   DOB: May 03, 1926, 82 y.o.   MRN: [id number]    Primary Care Physician: Debbie Pao, MD Referring Physician: Dr. Lynnea Ferrier SHEVETTE Kramer is a 82 y.o. female with a h/o CVA, with implantable loop monitor, seen 3/16, that was seen in the afib clinic after 6 mins of afib was detected on device, to initiate anticoagulation. Pt denies any bleeding history, steady on her feet, no falls. Discussion of eliquis/xarelto and coumadin described in detail. She and the daughter would prefer for pt to be on eliquis. Chadsvasc score of 6. Pt was unaware of any irregular heart beat. She had been off BB, due to refill issues, but has been back on couple of weeks now.  Returns to the afib clinic and has been on eliquis 2.5 mg bid for about one month. No issues with anticoagulant, tolerating without bleeding issues. Will see PCP in October. Daughter has reduced metoprolol tartrate to 50 mg 1/2 tab bid, but HR is 61 and BP is 96/60 and will reduce to 12.5 mg bid.  Today, she denies symptoms of palpitations, chest pain, shortness of breath, orthopnea, PND, lower extremity edema, dizziness, presyncope, syncope, or neurologic sequela. The patient is tolerating medications without difficulties and is otherwise without complaint today.   Past Medical History:  Diagnosis Date  . Bronchitis   . COPD (chronic obstructive pulmonary disease) ([place])   . Degenerative disc disease   . Hypertension   . Rheumatoid arthritis(714.0)    Past Surgical History:  Procedure Laterality Date  . CHOLECYSTECTOMY     lap choley  . HERNIA REPAIR     umbilical  . KNEE SURGERY Bilateral 1996, 1997   replacements  . LOOP RECORDER IMPLANT N/A 10/25/2014   Procedure: LOOP RECORDER IMPLANT;  Surgeon: Debbie Sprang, MD;  Location: [facility] CATH LAB;  Service: Cardiovascular;  Laterality: N/A;    Current Outpatient Prescriptions  Medication Sig Dispense Refill  . albuterol (PROVENTIL HFA;VENTOLIN HFA)  108 (90 BASE) MCG/ACT inhaler Inhale 2 puffs into the lungs every 4 (four) hours as needed for wheezing or shortness of breath. 1 Inhaler 0  . alendronate (FOSAMAX) 70 MG tablet Take 70 mg by mouth once a week. Take with a full glass of water on an empty stomach.    Marland Kitchen apixaban (ELIQUIS) 2.5 MG TABS tablet Take 1 tablet (2.5 mg total) by mouth 2 (two) times daily. 60 tablet 0  . benazepril (LOTENSIN) 20 MG tablet Take 20 mg by mouth daily.      . Calcium Citrate-Vitamin D (CITRACAL PETITES/VITAMIN D PO) Take 1 tablet by mouth daily.     . feeding supplement (BOOST / RESOURCE BREEZE) LIQD Take 1 Container by mouth 3 (three) times daily between meals.  0  . Fluticasone Furoate-Vilanterol (BREO ELLIPTA) 100-25 MCG/INH AEPB Inhale 1 puff into the lungs daily.    . folic acid (FOLVITE) 1 MG tablet Take 1 mg by mouth daily.    Marland Kitchen HYDROcodone-acetaminophen (NORCO/VICODIN) 5-325 MG per tablet Take 1 tablet by mouth 2 (two) times daily.     . InFLIXimab (REMICADE IV) Inject 200 mg into the vein every 8 (eight) weeks.     . methotrexate 2.5 MG tablet Take 15 mg by mouth once a week.     . Multiple Vitamin (MULTIVITAMIN PO) Take 1 capsule by mouth daily.     . simvastatin (ZOCOR) 10 MG tablet Take 1 tablet (10 mg total) by mouth daily at 6  PM. 30 tablet 0  . metoprolol tartrate (LOPRESSOR) 25 MG tablet Take 0.5 tablets (12.5 mg total) by mouth 2 (two) times daily. 30 tablet 2   No current facility-administered medications for this encounter.     No Known Allergies  Social History   Social History  . Marital status: Widowed    Spouse name: N/A  . Number of children: 5  . Years of education: 10   Occupational History  . Retired     retired   Social History Main Topics  . Smoking status: Former Smoker    Packs/day: 0.00    Years: 50.00    Types: Cigarettes    Quit date: 08/04/2015  . Smokeless tobacco: Not on file     Comment: Smokes a few cigarettes per day.    . Alcohol use No  . Drug use:  No  . Sexual activity: Not on file   Other Topics Concern  . Not on file   Social History Narrative   Lives with daughter, Debbie Kramer; widowed   Right handed   Caffeine use - none    Family History  Problem Relation Age of Onset  . Stroke Father   . Cancer Father     lung  . Cancer Mother   . Diabetes Brother   . Stroke Maternal Grandmother     ROS- All systems are reviewed and negative except as per the HPI above  Physical Exam: Vitals:   03/25/16 1545  BP: 96/60  Pulse: 61  Weight: 128 lb (58.1 kg)  Height: 5' (1.524 m)    GEN- The patient is well appearing, alert and oriented x 3 today.   Head- normocephalic, atraumatic Eyes-  Sclera clear, conjunctiva pink Ears- hearing intact Oropharynx- clear Neck- supple, no JVP Lymph- no cervical lymphadenopathy Lungs- Clear to ausculation bilaterally, normal work of breathing Heart-regular rate and rhythm, no murmurs, rubs or gallops, PMI not laterally displaced GI- soft, NT, ND, + BS Extremities- no clubbing, cyanosis, or edema MS- no significant deformity or atrophy Skin- no rash or lesion Psych- euthymic mood, full affect Neuro- strength and sensation are intact  EKG- NSR, LAFB at 61 bpm, pr int 184 ms, qrs int 98 ms, qtc 440 ms LinQ monitor reviewed   Assessment and Plan: 1. PAF,on Linq, with prior CVA, chadsvasc score of 6 No bleeding tendencies Bleeding precautions discussed Continue eliquis 2.5 mg bid Stop asa Avoid antiinflammatories Bmet/cbc today  F/u with PCP in October afib clinic as needed  [place]. Debbie Kramer, [facility] [address] [place], [place] [address] [phone]

## 2016-04-02 ENCOUNTER — Other Ambulatory Visit (HOSPITAL_COMMUNITY): Payer: Self-pay | Admitting: Nurse Practitioner

## 2016-04-03 ENCOUNTER — Telehealth (HOSPITAL_COMMUNITY): Payer: Self-pay | Admitting: *Deleted

## 2016-04-03 NOTE — Telephone Encounter (Signed)
Prior authorization for Eliquis 2.'5mg'$  twice a day, [id number] valid through 08/02/2016

## 2016-04-09 DIAGNOSIS — M0589 Other rheumatoid arthritis with rheumatoid factor of multiple sites: Secondary | ICD-10-CM | POA: Diagnosis not present

## 2016-04-09 DIAGNOSIS — Z79899 Other long term (current) drug therapy: Secondary | ICD-10-CM | POA: Diagnosis not present

## 2016-04-14 LAB — CUP PACEART REMOTE DEVICE CHECK: MDC IDC SESS DTM: 20170817003652

## 2016-04-17 ENCOUNTER — Ambulatory Visit (INDEPENDENT_AMBULATORY_CARE_PROVIDER_SITE_OTHER): Payer: Medicare Other | Admitting: *Deleted

## 2016-04-17 DIAGNOSIS — I480 Paroxysmal atrial fibrillation: Secondary | ICD-10-CM | POA: Diagnosis not present

## 2016-04-17 DIAGNOSIS — Z6824 Body mass index (BMI) 24.0-24.9, adult: Secondary | ICD-10-CM | POA: Diagnosis not present

## 2016-04-17 DIAGNOSIS — M810 Age-related osteoporosis without current pathological fracture: Secondary | ICD-10-CM | POA: Diagnosis not present

## 2016-04-17 DIAGNOSIS — R0609 Other forms of dyspnea: Secondary | ICD-10-CM | POA: Diagnosis not present

## 2016-04-17 DIAGNOSIS — J441 Chronic obstructive pulmonary disease with (acute) exacerbation: Secondary | ICD-10-CM | POA: Diagnosis not present

## 2016-04-17 DIAGNOSIS — M0589 Other rheumatoid arthritis with rheumatoid factor of multiple sites: Secondary | ICD-10-CM | POA: Diagnosis not present

## 2016-04-17 DIAGNOSIS — I639 Cerebral infarction, unspecified: Secondary | ICD-10-CM

## 2016-04-17 DIAGNOSIS — M150 Primary generalized (osteo)arthritis: Secondary | ICD-10-CM | POA: Diagnosis not present

## 2016-04-17 DIAGNOSIS — R0902 Hypoxemia: Secondary | ICD-10-CM | POA: Diagnosis not present

## 2016-04-17 DIAGNOSIS — G458 Other transient cerebral ischemic attacks and related syndromes: Secondary | ICD-10-CM | POA: Diagnosis not present

## 2016-04-17 DIAGNOSIS — R0602 Shortness of breath: Secondary | ICD-10-CM | POA: Diagnosis not present

## 2016-04-17 DIAGNOSIS — M5136 Other intervertebral disc degeneration, lumbar region: Secondary | ICD-10-CM | POA: Diagnosis not present

## 2016-04-17 DIAGNOSIS — R062 Wheezing: Secondary | ICD-10-CM | POA: Diagnosis not present

## 2016-04-20 NOTE — Progress Notes (Signed)
Carelink Summary Report / Loop Recorder 

## 2016-05-04 DIAGNOSIS — I10 Essential (primary) hypertension: Secondary | ICD-10-CM | POA: Diagnosis not present

## 2016-05-04 DIAGNOSIS — R7301 Impaired fasting glucose: Secondary | ICD-10-CM | POA: Diagnosis not present

## 2016-05-04 DIAGNOSIS — M810 Age-related osteoporosis without current pathological fracture: Secondary | ICD-10-CM | POA: Diagnosis not present

## 2016-05-08 DIAGNOSIS — Z0000 Encounter for general adult medical examination without abnormal findings: Secondary | ICD-10-CM | POA: Diagnosis not present

## 2016-05-08 DIAGNOSIS — K5909 Other constipation: Secondary | ICD-10-CM | POA: Diagnosis not present

## 2016-05-08 DIAGNOSIS — R7301 Impaired fasting glucose: Secondary | ICD-10-CM | POA: Diagnosis not present

## 2016-05-08 DIAGNOSIS — N390 Urinary tract infection, site not specified: Secondary | ICD-10-CM | POA: Diagnosis not present

## 2016-05-08 DIAGNOSIS — R8299 Other abnormal findings in urine: Secondary | ICD-10-CM | POA: Diagnosis not present

## 2016-05-08 DIAGNOSIS — Z23 Encounter for immunization: Secondary | ICD-10-CM | POA: Diagnosis not present

## 2016-05-08 DIAGNOSIS — Z6824 Body mass index (BMI) 24.0-24.9, adult: Secondary | ICD-10-CM | POA: Diagnosis not present

## 2016-05-08 DIAGNOSIS — I129 Hypertensive chronic kidney disease with stage 1 through stage 4 chronic kidney disease, or unspecified chronic kidney disease: Secondary | ICD-10-CM | POA: Diagnosis not present

## 2016-05-08 DIAGNOSIS — Z1389 Encounter for screening for other disorder: Secondary | ICD-10-CM | POA: Diagnosis not present

## 2016-05-08 DIAGNOSIS — I63411 Cerebral infarction due to embolism of right middle cerebral artery: Secondary | ICD-10-CM | POA: Diagnosis not present

## 2016-05-08 DIAGNOSIS — I480 Paroxysmal atrial fibrillation: Secondary | ICD-10-CM | POA: Diagnosis not present

## 2016-05-08 DIAGNOSIS — E46 Unspecified protein-calorie malnutrition: Secondary | ICD-10-CM | POA: Diagnosis not present

## 2016-05-09 LAB — CUP PACEART REMOTE DEVICE CHECK: MDC IDC SESS DTM: 20170916010536

## 2016-05-09 NOTE — Progress Notes (Signed)
Carelink summary report received. Battery status OK. Normal device function. No new symptom episodes, tachy episodes, brady, or pause episodes. No new AF episodes. Monthly summary reports and ROV/PRN 

## 2016-05-18 ENCOUNTER — Ambulatory Visit (INDEPENDENT_AMBULATORY_CARE_PROVIDER_SITE_OTHER): Payer: Medicare Other | Admitting: *Deleted

## 2016-05-18 DIAGNOSIS — I639 Cerebral infarction, unspecified: Secondary | ICD-10-CM

## 2016-05-18 NOTE — Progress Notes (Signed)
Carelink Summary Report / Loop Recorder 

## 2016-06-04 DIAGNOSIS — Z79899 Other long term (current) drug therapy: Secondary | ICD-10-CM | POA: Diagnosis not present

## 2016-06-04 DIAGNOSIS — M0589 Other rheumatoid arthritis with rheumatoid factor of multiple sites: Secondary | ICD-10-CM | POA: Diagnosis not present

## 2016-06-16 ENCOUNTER — Ambulatory Visit (INDEPENDENT_AMBULATORY_CARE_PROVIDER_SITE_OTHER): Payer: Medicare Other | Admitting: *Deleted

## 2016-06-16 DIAGNOSIS — I639 Cerebral infarction, unspecified: Secondary | ICD-10-CM | POA: Diagnosis not present

## 2016-06-17 NOTE — Progress Notes (Signed)
Carelink Summary Report / Loop Recorder 

## 2016-06-20 LAB — CUP PACEART REMOTE DEVICE CHECK
MDC IDC PG IMPLANT DT: 20160324
MDC IDC SESS DTM: 20171016023622

## 2016-06-20 NOTE — Progress Notes (Signed)
Carelink summary report received. Battery status OK. Normal device function. No new symptom episodes, brady, or pause episodes. No new AF episodes. 1 tachy episode. Monthly summary reports and ROV/PRN

## 2016-07-16 ENCOUNTER — Ambulatory Visit (INDEPENDENT_AMBULATORY_CARE_PROVIDER_SITE_OTHER): Payer: Medicare Other | Admitting: *Deleted

## 2016-07-16 DIAGNOSIS — I639 Cerebral infarction, unspecified: Secondary | ICD-10-CM

## 2016-07-17 NOTE — Progress Notes (Signed)
Carelink Summary Report / Loop Recorder 

## 2016-07-20 DIAGNOSIS — G458 Other transient cerebral ischemic attacks and related syndromes: Secondary | ICD-10-CM | POA: Diagnosis not present

## 2016-07-20 DIAGNOSIS — M0589 Other rheumatoid arthritis with rheumatoid factor of multiple sites: Secondary | ICD-10-CM | POA: Diagnosis not present

## 2016-07-20 DIAGNOSIS — M150 Primary generalized (osteo)arthritis: Secondary | ICD-10-CM | POA: Diagnosis not present

## 2016-07-20 DIAGNOSIS — M5136 Other intervertebral disc degeneration, lumbar region: Secondary | ICD-10-CM | POA: Diagnosis not present

## 2016-07-20 DIAGNOSIS — M810 Age-related osteoporosis without current pathological fracture: Secondary | ICD-10-CM | POA: Diagnosis not present

## 2016-07-20 DIAGNOSIS — R0902 Hypoxemia: Secondary | ICD-10-CM | POA: Diagnosis not present

## 2016-07-20 DIAGNOSIS — Z6822 Body mass index (BMI) 22.0-22.9, adult: Secondary | ICD-10-CM | POA: Diagnosis not present

## 2016-07-30 LAB — CUP PACEART REMOTE DEVICE CHECK
Date Time Interrogation Session: 20171115033702
Implantable Pulse Generator Implant Date: 20160324

## 2016-08-17 ENCOUNTER — Ambulatory Visit (INDEPENDENT_AMBULATORY_CARE_PROVIDER_SITE_OTHER): Payer: Medicare Other | Admitting: *Deleted

## 2016-08-17 DIAGNOSIS — I639 Cerebral infarction, unspecified: Secondary | ICD-10-CM

## 2016-08-18 NOTE — Progress Notes (Signed)
Carelink Summary Report 

## 2016-08-26 DIAGNOSIS — J159 Unspecified bacterial pneumonia: Secondary | ICD-10-CM | POA: Diagnosis not present

## 2016-08-26 DIAGNOSIS — G8929 Other chronic pain: Secondary | ICD-10-CM | POA: Diagnosis not present

## 2016-08-26 DIAGNOSIS — J441 Chronic obstructive pulmonary disease with (acute) exacerbation: Secondary | ICD-10-CM | POA: Diagnosis not present

## 2016-08-26 DIAGNOSIS — R0609 Other forms of dyspnea: Secondary | ICD-10-CM | POA: Diagnosis not present

## 2016-08-26 DIAGNOSIS — M069 Rheumatoid arthritis, unspecified: Secondary | ICD-10-CM | POA: Diagnosis not present

## 2016-08-26 DIAGNOSIS — E46 Unspecified protein-calorie malnutrition: Secondary | ICD-10-CM | POA: Diagnosis not present

## 2016-08-26 DIAGNOSIS — I480 Paroxysmal atrial fibrillation: Secondary | ICD-10-CM | POA: Diagnosis not present

## 2016-08-26 DIAGNOSIS — K5909 Other constipation: Secondary | ICD-10-CM | POA: Diagnosis not present

## 2016-09-02 ENCOUNTER — Other Ambulatory Visit (HOSPITAL_COMMUNITY): Payer: Self-pay | Admitting: Nurse Practitioner

## 2016-09-06 LAB — CUP PACEART REMOTE DEVICE CHECK
Date Time Interrogation Session: 20171215043657
Implantable Pulse Generator Implant Date: 20160324

## 2016-09-06 NOTE — Progress Notes (Signed)
Carelink summary report received. Battery status OK. Normal device function. No new symptom episodes, tachy episodes, brady, or pause episodes. No new AF episodes. Monthly summary reports and ROV/PRN 

## 2016-09-08 ENCOUNTER — Other Ambulatory Visit (HOSPITAL_COMMUNITY): Payer: Self-pay | Admitting: *Deleted

## 2016-09-08 MED ORDER — APIXABAN 2.5 MG PO TABS: 2.5000 mg | ORAL_TABLET | Freq: Two times a day (BID) | ORAL | 1 refills | Status: AC

## 2016-09-11 ENCOUNTER — Telehealth: Payer: Self-pay | Admitting: *Deleted

## 2016-09-11 NOTE — Telephone Encounter (Signed)
Spoke with patient's daughter, Debbie Kramer ([place]).  She reports patient "has been going downhill since the first of the year".  She does not get out of bed much anymore.  Patient has had intermittent increased ShOB which Debbie Kramer correlates with A-fib episodes as her HR is elevated on the pulse oximeter.  Patient's A-fib burden per her Cruz Condon has been elevated since last weekend (2/3), with episodes of A-fib w/RVR lasting up to 1hr 78mn.  Debbie Kluverdenies that patient has reported palpitations, dizziness, chest discomfort, or other symptoms.  Patient is still taking Eliquis as prescribed, but is unable to tolerate metoprolol tartrate 12.'5mg'$  BID due to BPs in the 80s/40s after the second dose (she has been taking it only once daily).  Patient is scheduled for a PCP appointment on 09/14/16 to discuss obtaining orders for O2.  Debbie Kramer agreeable to an AF Clinic appointment on 09/15/16 to discuss rate control and ShOB with A-fib episodes.  Debbie [facility]rowclinic phone number and encouraged her to call for parking code.  Debbie Kramer appreciative of assistance and denies additional questions or concerns at this time.

## 2016-09-14 ENCOUNTER — Inpatient Hospital Stay (HOSPITAL_COMMUNITY)
Admission: EM | Admit: 2016-09-14 | Discharge: 2016-09-19 | DRG: 871 | Disposition: A | Discharge status: Skilled Nursing Facility | Payer: Medicare Other | Attending: Internal Medicine | Admitting: Internal Medicine

## 2016-09-14 ENCOUNTER — Encounter (HOSPITAL_COMMUNITY): Payer: Self-pay | Admitting: Emergency Medicine

## 2016-09-14 ENCOUNTER — Emergency Department (HOSPITAL_COMMUNITY): Payer: Medicare Other

## 2016-09-14 DIAGNOSIS — Z823 Family history of stroke: Secondary | ICD-10-CM

## 2016-09-14 DIAGNOSIS — R222 Localized swelling, mass and lump, trunk: Secondary | ICD-10-CM | POA: Diagnosis not present

## 2016-09-14 DIAGNOSIS — I5042 Chronic combined systolic (congestive) and diastolic (congestive) heart failure: Secondary | ICD-10-CM | POA: Diagnosis present

## 2016-09-14 DIAGNOSIS — I771 Stricture of artery: Secondary | ICD-10-CM

## 2016-09-14 DIAGNOSIS — J440 Chronic obstructive pulmonary disease with acute lower respiratory infection: Secondary | ICD-10-CM | POA: Diagnosis present

## 2016-09-14 DIAGNOSIS — Z809 Family history of malignant neoplasm, unspecified: Secondary | ICD-10-CM

## 2016-09-14 DIAGNOSIS — A419 Sepsis, unspecified organism: Secondary | ICD-10-CM | POA: Diagnosis not present

## 2016-09-14 DIAGNOSIS — K551 Chronic vascular disorders of intestine: Secondary | ICD-10-CM | POA: Diagnosis present

## 2016-09-14 DIAGNOSIS — E860 Dehydration: Secondary | ICD-10-CM

## 2016-09-14 DIAGNOSIS — J181 Lobar pneumonia, unspecified organism: Secondary | ICD-10-CM | POA: Diagnosis not present

## 2016-09-14 DIAGNOSIS — D381 Neoplasm of uncertain behavior of trachea, bronchus and lung: Secondary | ICD-10-CM | POA: Diagnosis not present

## 2016-09-14 DIAGNOSIS — R739 Hyperglycemia, unspecified: Secondary | ICD-10-CM | POA: Diagnosis present

## 2016-09-14 DIAGNOSIS — Z66 Do not resuscitate: Secondary | ICD-10-CM | POA: Diagnosis present

## 2016-09-14 DIAGNOSIS — M069 Rheumatoid arthritis, unspecified: Secondary | ICD-10-CM | POA: Diagnosis present

## 2016-09-14 DIAGNOSIS — I959 Hypotension, unspecified: Secondary | ICD-10-CM | POA: Diagnosis not present

## 2016-09-14 DIAGNOSIS — Z6823 Body mass index (BMI) 23.0-23.9, adult: Secondary | ICD-10-CM

## 2016-09-14 DIAGNOSIS — J9859 Other diseases of mediastinum, not elsewhere classified: Secondary | ICD-10-CM | POA: Diagnosis not present

## 2016-09-14 DIAGNOSIS — J189 Pneumonia, unspecified organism: Secondary | ICD-10-CM | POA: Diagnosis present

## 2016-09-14 DIAGNOSIS — K5730 Diverticulosis of large intestine without perforation or abscess without bleeding: Secondary | ICD-10-CM | POA: Diagnosis not present

## 2016-09-14 DIAGNOSIS — Z7189 Other specified counseling: Secondary | ICD-10-CM | POA: Diagnosis not present

## 2016-09-14 DIAGNOSIS — R5383 Other fatigue: Secondary | ICD-10-CM | POA: Diagnosis not present

## 2016-09-14 DIAGNOSIS — R7989 Other specified abnormal findings of blood chemistry: Secondary | ICD-10-CM | POA: Diagnosis not present

## 2016-09-14 DIAGNOSIS — Z833 Family history of diabetes mellitus: Secondary | ICD-10-CM | POA: Diagnosis not present

## 2016-09-14 DIAGNOSIS — I708 Atherosclerosis of other arteries: Secondary | ICD-10-CM | POA: Diagnosis present

## 2016-09-14 DIAGNOSIS — R0602 Shortness of breath: Secondary | ICD-10-CM | POA: Diagnosis not present

## 2016-09-14 DIAGNOSIS — I10 Essential (primary) hypertension: Secondary | ICD-10-CM | POA: Diagnosis not present

## 2016-09-14 DIAGNOSIS — I70298 Other atherosclerosis of native arteries of extremities, other extremity: Secondary | ICD-10-CM | POA: Diagnosis present

## 2016-09-14 DIAGNOSIS — E46 Unspecified protein-calorie malnutrition: Secondary | ICD-10-CM | POA: Diagnosis present

## 2016-09-14 DIAGNOSIS — R1310 Dysphagia, unspecified: Secondary | ICD-10-CM | POA: Diagnosis present

## 2016-09-14 DIAGNOSIS — R05 Cough: Secondary | ICD-10-CM | POA: Diagnosis not present

## 2016-09-14 DIAGNOSIS — C3490 Malignant neoplasm of unspecified part of unspecified bronchus or lung: Secondary | ICD-10-CM | POA: Diagnosis present

## 2016-09-14 DIAGNOSIS — Z7901 Long term (current) use of anticoagulants: Secondary | ICD-10-CM

## 2016-09-14 DIAGNOSIS — I480 Paroxysmal atrial fibrillation: Secondary | ICD-10-CM | POA: Diagnosis not present

## 2016-09-14 DIAGNOSIS — C3492 Malignant neoplasm of unspecified part of left bronchus or lung: Secondary | ICD-10-CM | POA: Diagnosis not present

## 2016-09-14 DIAGNOSIS — G8929 Other chronic pain: Secondary | ICD-10-CM | POA: Diagnosis present

## 2016-09-14 DIAGNOSIS — I110 Hypertensive heart disease with heart failure: Secondary | ICD-10-CM | POA: Diagnosis present

## 2016-09-14 DIAGNOSIS — R740 Nonspecific elevation of levels of transaminase and lactic acid dehydrogenase [LDH]: Secondary | ICD-10-CM | POA: Diagnosis not present

## 2016-09-14 DIAGNOSIS — M6281 Muscle weakness (generalized): Secondary | ICD-10-CM | POA: Diagnosis not present

## 2016-09-14 DIAGNOSIS — I9589 Other hypotension: Secondary | ICD-10-CM | POA: Diagnosis not present

## 2016-09-14 DIAGNOSIS — J910 Malignant pleural effusion: Secondary | ICD-10-CM | POA: Diagnosis present

## 2016-09-14 DIAGNOSIS — R627 Adult failure to thrive: Secondary | ICD-10-CM | POA: Diagnosis not present

## 2016-09-14 DIAGNOSIS — N179 Acute kidney failure, unspecified: Secondary | ICD-10-CM | POA: Diagnosis present

## 2016-09-14 DIAGNOSIS — R1319 Other dysphagia: Secondary | ICD-10-CM | POA: Diagnosis not present

## 2016-09-14 DIAGNOSIS — E785 Hyperlipidemia, unspecified: Secondary | ICD-10-CM | POA: Diagnosis present

## 2016-09-14 DIAGNOSIS — I5032 Chronic diastolic (congestive) heart failure: Secondary | ICD-10-CM | POA: Diagnosis not present

## 2016-09-14 DIAGNOSIS — R918 Other nonspecific abnormal finding of lung field: Secondary | ICD-10-CM | POA: Diagnosis not present

## 2016-09-14 DIAGNOSIS — Z87891 Personal history of nicotine dependence: Secondary | ICD-10-CM

## 2016-09-14 DIAGNOSIS — Z7902 Long term (current) use of antithrombotics/antiplatelets: Secondary | ICD-10-CM | POA: Diagnosis not present

## 2016-09-14 DIAGNOSIS — R6510 Systemic inflammatory response syndrome (SIRS) of non-infectious origin without acute organ dysfunction: Secondary | ICD-10-CM | POA: Diagnosis not present

## 2016-09-14 DIAGNOSIS — Z515 Encounter for palliative care: Secondary | ICD-10-CM | POA: Diagnosis not present

## 2016-09-14 DIAGNOSIS — I639 Cerebral infarction, unspecified: Secondary | ICD-10-CM | POA: Diagnosis not present

## 2016-09-14 DIAGNOSIS — E876 Hypokalemia: Secondary | ICD-10-CM | POA: Diagnosis present

## 2016-09-14 DIAGNOSIS — I509 Heart failure, unspecified: Secondary | ICD-10-CM | POA: Diagnosis not present

## 2016-09-14 DIAGNOSIS — R2689 Other abnormalities of gait and mobility: Secondary | ICD-10-CM | POA: Diagnosis not present

## 2016-09-14 DIAGNOSIS — I4891 Unspecified atrial fibrillation: Secondary | ICD-10-CM | POA: Diagnosis not present

## 2016-09-14 DIAGNOSIS — Z8673 Personal history of transient ischemic attack (TIA), and cerebral infarction without residual deficits: Secondary | ICD-10-CM

## 2016-09-14 DIAGNOSIS — J449 Chronic obstructive pulmonary disease, unspecified: Secondary | ICD-10-CM | POA: Diagnosis not present

## 2016-09-14 DIAGNOSIS — I739 Peripheral vascular disease, unspecified: Secondary | ICD-10-CM | POA: Diagnosis not present

## 2016-09-14 DIAGNOSIS — R41841 Cognitive communication deficit: Secondary | ICD-10-CM | POA: Diagnosis not present

## 2016-09-14 DIAGNOSIS — J9811 Atelectasis: Secondary | ICD-10-CM

## 2016-09-14 DIAGNOSIS — M05712 Rheumatoid arthritis with rheumatoid factor of left shoulder without organ or systems involvement: Secondary | ICD-10-CM | POA: Diagnosis not present

## 2016-09-14 DIAGNOSIS — R278 Other lack of coordination: Secondary | ICD-10-CM | POA: Diagnosis not present

## 2016-09-14 HISTORY — DX: Heart failure, unspecified: I50.9

## 2016-09-14 HISTORY — DX: Cerebral infarction, unspecified: I63.9

## 2016-09-14 HISTORY — DX: Unspecified atrial fibrillation: I48.91

## 2016-09-14 LAB — COMPREHENSIVE METABOLIC PANEL
ALT: 25 U/L (ref 14–54)
ANION GAP: 12 (ref 5–15)
AST: 39 U/L (ref 15–41)
Albumin: 2 g/dL — ABNORMAL LOW (ref 3.5–5.0)
Alkaline Phosphatase: 62 U/L (ref 38–126)
BILIRUBIN TOTAL: 0.9 mg/dL (ref 0.3–1.2)
BUN: 35 mg/dL — ABNORMAL HIGH (ref 6–20)
CO2: 20 mmol/L — ABNORMAL LOW (ref 22–32)
Calcium: 7.7 mg/dL — ABNORMAL LOW (ref 8.9–10.3)
Chloride: 104 mmol/L (ref 101–111)
Creatinine, Ser: 0.9 mg/dL (ref 0.44–1.00)
GFR, EST NON AFRICAN AMERICAN: 55 mL/min — AB (ref >60)
Glucose, Bld: 140 mg/dL — ABNORMAL HIGH (ref 65–99)
POTASSIUM: 3.3 mmol/L — AB (ref 3.5–5.1)
Sodium: 136 mmol/L (ref 135–145)
TOTAL PROTEIN: 5 g/dL — AB (ref 6.5–8.1)

## 2016-09-14 LAB — URINALYSIS, ROUTINE W REFLEX MICROSCOPIC
BILIRUBIN URINE: NEGATIVE
Glucose, UA: NEGATIVE mg/dL
Ketones, ur: NEGATIVE mg/dL
Nitrite: NEGATIVE
PH: 5 (ref 5.0–8.0)
Protein, ur: NEGATIVE mg/dL
SPECIFIC GRAVITY, URINE: 1.025 (ref 1.005–1.030)

## 2016-09-14 LAB — I-STAT CG4 LACTIC ACID, ED
LACTIC ACID, VENOUS: 8.31 mmol/L — AB (ref 0.5–1.9)
LACTIC ACID, VENOUS: 2.03 mmol/L — AB (ref 0.5–1.9)

## 2016-09-14 LAB — CBG MONITORING, ED: GLUCOSE-CAPILLARY: 176 mg/dL — AB (ref 65–99)

## 2016-09-14 LAB — LIPASE, BLOOD: LIPASE: 22 U/L (ref 11–51)

## 2016-09-14 LAB — CBC WITH DIFFERENTIAL/PLATELET
BASOS PCT: 0 %
Basophils Absolute: 0 10*3/uL (ref 0.0–0.1)
EOS ABS: 0.1 10*3/uL (ref 0.0–0.7)
Eosinophils Relative: 0 %
HEMATOCRIT: 36.7 % (ref 36.0–46.0)
HEMOGLOBIN: 11.9 g/dL — AB (ref 12.0–15.0)
LYMPHS ABS: 2.2 10*3/uL (ref 0.7–4.0)
Lymphocytes Relative: 11 %
MCH: 30.5 pg (ref 26.0–34.0)
MCHC: 32.4 g/dL (ref 30.0–36.0)
MCV: 94.1 fL (ref 78.0–100.0)
Monocytes Absolute: 0.5 10*3/uL (ref 0.1–1.0)
Monocytes Relative: 3 %
NEUTROS ABS: 16.4 10*3/uL — AB (ref 1.7–7.7)
NEUTROS PCT: 86 %
Platelets: 372 10*3/uL (ref 150–400)
RBC: 3.9 MIL/uL (ref 3.87–5.11)
RDW: 15.7 % — ABNORMAL HIGH (ref 11.5–15.5)
WBC: 19.1 10*3/uL — AB (ref 4.0–10.5)

## 2016-09-14 LAB — APTT: APTT: 39 s — AB (ref 24–36)

## 2016-09-14 LAB — I-STAT TROPONIN, ED: TROPONIN I, POC: 0.05 ng/mL (ref 0.00–0.08)

## 2016-09-14 LAB — STREP PNEUMONIAE URINARY ANTIGEN: STREP PNEUMO URINARY ANTIGEN: NEGATIVE

## 2016-09-14 LAB — ABO/RH: ABO/RH(D): A POS

## 2016-09-14 LAB — TYPE AND SCREEN
ABO/RH(D): A POS
ANTIBODY SCREEN: NEGATIVE

## 2016-09-14 LAB — I-STAT CHEM 8, ED
BUN: 46 mg/dL — ABNORMAL HIGH (ref 6–20)
CHLORIDE: 97 mmol/L — AB (ref 101–111)
Calcium, Ion: 1.06 mmol/L — ABNORMAL LOW (ref 1.15–1.40)
Creatinine, Ser: 1.1 mg/dL — ABNORMAL HIGH (ref 0.44–1.00)
Glucose, Bld: 232 mg/dL — ABNORMAL HIGH (ref 65–99)
HEMATOCRIT: 36 % (ref 36.0–46.0)
HEMOGLOBIN: 12.2 g/dL (ref 12.0–15.0)
POTASSIUM: 3.7 mmol/L (ref 3.5–5.1)
Sodium: 133 mmol/L — ABNORMAL LOW (ref 135–145)
TCO2: 22 mmol/L (ref 0–100)

## 2016-09-14 LAB — PROTIME-INR
INR: 1.86
PROTHROMBIN TIME: 21.7 s — AB (ref 11.4–15.2)

## 2016-09-14 LAB — LACTIC ACID, PLASMA: Lactic Acid, Venous: 2 mmol/L (ref 0.5–1.9)

## 2016-09-14 LAB — GLUCOSE, CAPILLARY: Glucose-Capillary: 103 mg/dL — ABNORMAL HIGH (ref 65–99)

## 2016-09-14 LAB — MAGNESIUM: MAGNESIUM: 1.9 mg/dL (ref 1.7–2.4)

## 2016-09-14 LAB — PHOSPHORUS: PHOSPHORUS: 3.3 mg/dL (ref 2.5–4.6)

## 2016-09-14 MED ORDER — HYDROCORTISONE NA SUCCINATE PF 100 MG IJ SOLR
50.0000 mg | Freq: Four times a day (QID) | INTRAMUSCULAR | Status: DC
  Administered 2016-09-14 – 2016-09-15 (×5): 50 mg via INTRAVENOUS
  Filled 2016-09-14 (×5): qty 2

## 2016-09-14 MED ORDER — SODIUM CHLORIDE 0.9 % IV SOLN
INTRAVENOUS | Status: DC
  Administered 2016-09-14: 16:00:00 via INTRAVENOUS

## 2016-09-14 MED ORDER — APIXABAN 2.5 MG PO TABS
2.5000 mg | ORAL_TABLET | Freq: Two times a day (BID) | ORAL | Status: DC
  Administered 2016-09-14 – 2016-09-19 (×10): 2.5 mg via ORAL
  Filled 2016-09-14 (×11): qty 1

## 2016-09-14 MED ORDER — PIPERACILLIN-TAZOBACTAM 3.375 G IVPB
3.3750 g | Freq: Three times a day (TID) | INTRAVENOUS | Status: DC
  Administered 2016-09-14 – 2016-09-16 (×4): 3.375 g via INTRAVENOUS
  Filled 2016-09-14 (×6): qty 50

## 2016-09-14 MED ORDER — PROMETHAZINE HCL 25 MG PO TABS: 12.5000 mg | ORAL_TABLET | Freq: Four times a day (QID) | ORAL | Status: DC | PRN

## 2016-09-14 MED ORDER — VANCOMYCIN HCL 500 MG IV SOLR
500.0000 mg | INTRAVENOUS | Status: DC
  Administered 2016-09-15: 500 mg via INTRAVENOUS
  Filled 2016-09-14 (×2): qty 500

## 2016-09-14 MED ORDER — BOOST / RESOURCE BREEZE PO LIQD
1.0000 | Freq: Three times a day (TID) | ORAL | Status: DC
  Administered 2016-09-14 – 2016-09-18 (×11): 1 via ORAL
  Filled 2016-09-14: qty 1

## 2016-09-14 MED ORDER — PIPERACILLIN-TAZOBACTAM 3.375 G IVPB 30 MIN
3.3750 g | Freq: Once | INTRAVENOUS | Status: AC
  Administered 2016-09-14: 3.375 g via INTRAVENOUS
  Filled 2016-09-14: qty 50

## 2016-09-14 MED ORDER — INSULIN ASPART 100 UNIT/ML ~~LOC~~ SOLN
0.0000 [IU] | Freq: Every day | SUBCUTANEOUS | Status: DC
  Administered 2016-09-17: 2 [IU] via SUBCUTANEOUS

## 2016-09-14 MED ORDER — ALBUTEROL SULFATE (2.5 MG/3ML) 0.083% IN NEBU
3.0000 mL | INHALATION_SOLUTION | RESPIRATORY_TRACT | Status: DC | PRN
  Administered 2016-09-17: 3 mL via RESPIRATORY_TRACT

## 2016-09-14 MED ORDER — INSULIN ASPART 100 UNIT/ML ~~LOC~~ SOLN
0.0000 [IU] | Freq: Three times a day (TID) | SUBCUTANEOUS | Status: DC
  Administered 2016-09-14 – 2016-09-15 (×2): 2 [IU] via SUBCUTANEOUS
  Administered 2016-09-15 (×2): 1 [IU] via SUBCUTANEOUS
  Administered 2016-09-16: 2 [IU] via SUBCUTANEOUS
  Administered 2016-09-16 – 2016-09-18 (×4): 1 [IU] via SUBCUTANEOUS
  Filled 2016-09-14: qty 1

## 2016-09-14 MED ORDER — SODIUM CHLORIDE 0.9 % IV BOLUS (SEPSIS)
1000.0000 mL | Freq: Once | INTRAVENOUS | Status: AC
  Administered 2016-09-14: 1000 mL via INTRAVENOUS

## 2016-09-14 MED ORDER — METHOTREXATE 2.5 MG PO TABS
15.0000 mg | ORAL_TABLET | ORAL | Status: DC
  Administered 2016-09-19: 15 mg via ORAL
  Filled 2016-09-14: qty 6

## 2016-09-14 MED ORDER — SODIUM CHLORIDE 0.9% FLUSH
3.0000 mL | Freq: Two times a day (BID) | INTRAVENOUS | Status: DC
  Administered 2016-09-16 – 2016-09-18 (×6): 3 mL via INTRAVENOUS

## 2016-09-14 MED ORDER — SODIUM CHLORIDE 0.9 % IV BOLUS (SEPSIS): 1000.0000 mL | Freq: Once | INTRAVENOUS | Status: DC

## 2016-09-14 MED ORDER — IOPAMIDOL (ISOVUE-370) INJECTION 76%
INTRAVENOUS | Status: AC
  Administered 2016-09-14: 100 mL
  Filled 2016-09-14: qty 100

## 2016-09-14 MED ORDER — SIMVASTATIN 10 MG PO TABS
10.0000 mg | ORAL_TABLET | Freq: Every day | ORAL | Status: DC
  Administered 2016-09-15 – 2016-09-18 (×4): 10 mg via ORAL
  Filled 2016-09-14 (×4): qty 1

## 2016-09-14 MED ORDER — HYDROCODONE-ACETAMINOPHEN 5-325 MG PO TABS
1.0000 | ORAL_TABLET | Freq: Two times a day (BID) | ORAL | Status: DC
  Administered 2016-09-14 – 2016-09-19 (×10): 1 via ORAL
  Filled 2016-09-14 (×10): qty 1

## 2016-09-14 MED ORDER — SODIUM CHLORIDE 0.9 % IV BOLUS (SEPSIS)
2000.0000 mL | Freq: Once | INTRAVENOUS | Status: AC
  Administered 2016-09-14: 2000 mL via INTRAVENOUS

## 2016-09-14 MED ORDER — FLUTICASONE FUROATE-VILANTEROL 100-25 MCG/INH IN AEPB: 1.0000 | INHALATION_SPRAY | Freq: Every day | RESPIRATORY_TRACT | Status: DC

## 2016-09-14 MED ORDER — VANCOMYCIN HCL IN DEXTROSE 1-5 GM/200ML-% IV SOLN
1000.0000 mg | Freq: Once | INTRAVENOUS | Status: AC
  Administered 2016-09-14: 1000 mg via INTRAVENOUS
  Filled 2016-09-14: qty 200

## 2016-09-14 NOTE — ED Triage Notes (Signed)
Pt here with family c/o generalized weakness x weeks getting more severe; pt unable to ambulate to car this am; pt per baseline otherwise; pt with poor PO intake

## 2016-09-14 NOTE — Progress Notes (Addendum)
Pharmacy Antibiotic Note  Debbie Kramer is a 83 y.o. female admitted on 09/14/2016 with sepsis.  Pharmacy has been consulted for vancomycin dosing. WBC and lactic acid elevated. SCr  1.10 (eGFR ~28 ml/min).  Plan: -Vancomycin 1gm IV x1 per MD -Vancomycin 521m IV q24h -Zosyn 3.375g IV x1 per MD -Monitor renal function, LOT, cultures, vancomycin level as indicated  ADDENDUM: Add Zosyn per pharmacy  Plan: -Zosyn 3.375g IV q8h EI   Weight: 128 lb 1.4 oz (58.1 kg)  Temp (24hrs), Avg:96.3 F (35.7 C), Min:96.3 F (35.7 C), Max:96.3 F (35.7 C)   Recent Labs Lab 09/14/16 1136 09/14/16 1200  WBC 19.1*  --   CREATININE  --  1.10*  LATICACIDVEN  --  8.31*    Estimated Creatinine Clearance: 28.2 mL/min (by C-G formula based on SCr of 1.1 mg/dL (H)).    No Known Allergies  Antimicrobials this admission: 2/12 Vancomycin >>  2/12 Zosyn >>  Dose adjustments this admission: none  Microbiology results: 2/12 BCx: IP  Thank you for allowing pharmacy to be a part of this patient's care.  MArrie Senate PharmD PGY-1 Pharmacy Resident Pager: [phone]/07/2017

## 2016-09-14 NOTE — ED Notes (Signed)
Pt CBG was 176, notified John(RN)

## 2016-09-14 NOTE — ED Notes (Signed)
Patient transported to CT 

## 2016-09-14 NOTE — ED Provider Notes (Signed)
[facility] DEPT Provider Note   CSN: [id number] Arrival date & time: 09/14/16  1101     History   Chief Complaint Chief Complaint  Patient presents with  . Weakness    HPI Debbie Kramer is a 83 y.o. female.  HPI Patient presents to the emergency department with increasing generalized weakness and decreased oral intake worsening over the past several weeks.  Today she was unable to ambulate because she was so weak and was brought to the emergency department for evaluation.  On arrival to the ER she was found to be hypotensive and hypothermic.  She reports abdominal pain since yesterday.  Family denies nausea vomiting diarrhea.  Reports no blood in the stool.  No fevers or chills.  No productive cough or chest pain.  Symptoms are moderate to severe in severity.  Nothing improves or worsens her symptoms.  She was scheduled to see her primary care doctor today but she was too weak and therefore was brought to the ER instead.   Past Medical History:  Diagnosis Date  . Bronchitis   . COPD (chronic obstructive pulmonary disease) ([place])   . Degenerative disc disease   . Hypertension   . Rheumatoid arthritis(714.0)     Patient Active Problem List   Diagnosis Date Noted  . Paroxysmal atrial fibrillation ([place]) 02/19/2016  . Malnutrition of moderate degree 08/08/2015  . CAP (community acquired pneumonia) 08/04/2015  . Elevated troponin 08/04/2015  . Abnormal ECG 08/04/2015  . Prolonged QT interval 08/04/2015  . Acute CVA (cerebrovascular accident) ([place]) 10/23/2014  . Essential hypertension 10/23/2014  . Tobacco abuse 10/23/2014  . Rheumatoid arthritis ([place]) 03/04/2011  . Osteoarthritis 03/04/2011  . Degenerative disc disease 03/04/2011    Past Surgical History:  Procedure Laterality Date  . CHOLECYSTECTOMY     lap choley  . HERNIA REPAIR     umbilical  . KNEE SURGERY Bilateral 1996, 1997   replacements  . LOOP RECORDER IMPLANT N/A 10/25/2014   Procedure: LOOP RECORDER  IMPLANT;  Surgeon: Deboraha Sprang, MD;  Location: [facility] CATH LAB;  Service: Cardiovascular;  Laterality: N/A;    OB History    No data available       Home Medications    Prior to Admission medications   Medication Sig Start Date End Date Taking? Authorizing Provider  albuterol (PROVENTIL HFA;VENTOLIN HFA) 108 (90 BASE) MCG/ACT inhaler Inhale 2 puffs into the lungs every 4 (four) hours as needed for wheezing or shortness of breath. 07/07/15  Yes Jola Schmidt, MD  apixaban (ELIQUIS) 2.5 MG TABS tablet Take 1 tablet (2.5 mg total) by mouth 2 (two) times daily. 09/08/16  Yes Sherran Needs, NP  benazepril (LOTENSIN) 20 MG tablet Take 20 mg by mouth daily.     Yes Historical Provider, MD  Calcium Citrate-Vitamin D (CITRACAL PETITES/VITAMIN D PO) Take 1 tablet by mouth daily.    Yes Historical Provider, MD  feeding supplement (BOOST / RESOURCE BREEZE) LIQD Take 1 Container by mouth 3 (three) times daily between meals. 08/08/15  Yes Hosie Poisson, MD  Fluticasone Furoate-Vilanterol (BREO ELLIPTA) 100-25 MCG/INH AEPB Inhale 1 puff into the lungs daily.   Yes Historical Provider, MD  folic acid (FOLVITE) 1 MG tablet Take 1 mg by mouth daily.   Yes Historical Provider, MD  furosemide (LASIX) 20 MG tablet Take 20 mg by mouth daily. 07/14/16  Yes Historical Provider, MD  HYDROcodone-acetaminophen (NORCO/VICODIN) 5-325 MG per tablet Take 1 tablet by mouth 2 (two) times daily.  11/24/14  Yes Historical Provider, MD  INCRUSE ELLIPTA 62.5 MCG/INH AEPB Take 1 Inhaler by mouth at bedtime. 08/16/16  Yes Historical Provider, MD  InFLIXimab (REMICADE IV) Inject 200 mg into the vein every 8 (eight) weeks.    Yes Historical Provider, MD  methotrexate 2.5 MG tablet Take 15 mg by mouth once a week.    Yes Historical Provider, MD  metoprolol tartrate (LOPRESSOR) 25 MG tablet Take 0.5 tablets (12.5 mg total) by mouth 2 (two) times daily. Please call and schedule a follow up appt for further refills [phone] Patient taking  differently: Take 12.5 mg by mouth daily. Please call and schedule a follow up appt for further refills [phone] 09/03/16  Yes Minus Breeding, MD  Multiple Vitamin (MULTIVITAMIN PO) Take 1 capsule by mouth daily.    Yes Historical Provider, MD  simvastatin (ZOCOR) 10 MG tablet Take 1 tablet (10 mg total) by mouth daily at 6 PM. 10/25/14  Yes Eugenie Filler, MD  alendronate (FOSAMAX) 70 MG tablet Take 70 mg by mouth once a week. Take with a full glass of water on an empty stomach.    Historical Provider, MD    Family History Family History  Problem Relation Age of Onset  . Stroke Father   . Cancer Father     lung  . Cancer Mother   . Diabetes Brother   . Stroke Maternal Grandmother     Social History Social History  Substance Use Topics  . Smoking status: Former Smoker    Packs/day: 0.00    Years: 50.00    Types: Cigarettes    Quit date: 08/04/2015  . Smokeless tobacco: Not on file     Comment: Smokes a few cigarettes per day.    . Alcohol use No     Allergies   Patient has no known allergies.   Review of Systems Review of Systems  All other systems reviewed and are negative.    Physical Exam Updated Vital Signs BP (!) 77/50   Pulse 89   Temp (!) 96.3 F (35.7 C) (Rectal)   Resp 25   Wt 128 lb 1.4 oz (58.1 kg)   LMP  (LMP Unknown) Comment: Postmenopausal  SpO2 100%   BMI 25.02 kg/m   Physical Exam  Constitutional: She is oriented to person, place, and time. She appears well-developed and well-nourished. No distress.  HENT:  Head: Normocephalic and atraumatic.  Eyes: EOM are normal.  Neck: Normal range of motion.  Cardiovascular: Normal rate, regular rhythm and normal heart sounds.   Pulmonary/Chest: Effort normal and breath sounds normal.  Abdominal: Soft. She exhibits no distension.  Mild generalized abdominal tenderness without guarding or rebound  Musculoskeletal: Normal range of motion.  Neurological: She is alert and oriented to person, place,  and time.  Skin: Skin is warm and dry.  Psychiatric: She has a normal mood and affect. Judgment normal.  Nursing note and vitals reviewed.    ED Treatments / Results  Labs (all labs ordered are listed, but only abnormal results are displayed) Labs Reviewed  CBC WITH DIFFERENTIAL/PLATELET - Abnormal; Notable for the following:       Result Value   WBC 19.1 (*)    Hemoglobin 11.9 (*)    RDW 15.7 (*)    Neutro Abs 16.4 (*)    All other components within normal limits  I-STAT CG4 LACTIC ACID, ED - Abnormal; Notable for the following:    Lactic Acid, Venous 8.31 (*)    All other components  within normal limits  I-STAT CHEM 8, ED - Abnormal; Notable for the following:    Sodium 133 (*)    Chloride 97 (*)    BUN 46 (*)    Creatinine, Ser 1.10 (*)    Glucose, Bld 232 (*)    Calcium, Ion 1.06 (*)    All other components within normal limits  CULTURE, BLOOD (ROUTINE X 2)  CULTURE, BLOOD (ROUTINE X 2)  COMPREHENSIVE METABOLIC PANEL  LIPASE, BLOOD  I-STAT TROPOININ, ED  I-STAT CG4 LACTIC ACID, ED  TYPE AND SCREEN  ABO/RH    EKG  EKG Interpretation None       Radiology Dg Chest Portable 1 View  Result Date: 09/14/2016 CLINICAL DATA:  Four days of shortness of breath, nonproductive cough. History of COPD, hypertension, discontinue smoking 1 year ago. Also history of rheumatoid arthritis. EXAM: PORTABLE CHEST 1 VIEW COMPARISON:  Chest x-ray and chest CT scan of August 04, 2015 FINDINGS: There is new elevation of the right hemidiaphragm. Gas-filled bowel lies deep to it. The right lung is adequately inflated. The interstitial markings of both lungs are chronically increased. There is no alveolar infiltrate. The heart and pulmonary vascularity are normal. There is dense calcification in the wall of the thoracic aorta and in the mitral valvular annulus. There is an implanted loop recorder present. There is no pleural effusion. The observed bony thorax exhibits no acute abnormality.  There is moderate degenerative change of the left shoulder. IMPRESSION: Chronic bronchitic changes. New elevation of the left hemidiaphragm. No CHF nor pneumonia. Thoracic aortic atherosclerosis. Electronically Signed   By: David  Martinique M.D.   On: 09/14/2016 13:55   Ct Angio Abd/pel W And/or Wo Contrast  Result Date: 09/14/2016 CLINICAL DATA:  Abdominal pain, decreased appetite EXAM: CTA ABDOMEN AND PELVIS WITH CONTRAST TECHNIQUE: Multidetector CT imaging of the abdomen and pelvis was performed using the standard protocol during bolus administration of intravenous contrast. Multiplanar reconstructed images and MIPs were obtained and reviewed to evaluate the vascular anatomy. CONTRAST:  100 mL Isovue-300 IV COMPARISON:  04/29/2010 FINDINGS: VASCULAR Coronary calcifications. Aorta: Tortuous atheromatous visualized distal descending thoracic segment. Coarse atheromatous calcifications through the abdominal aorta without aneurysm, dissection, or stenosis. Infrarenal segment ectatic up to 2.8 cm diameter. Celiac: Partially calcified ostial plaque without significant stenosis. Patent distally with unremarkable branch anatomy. SMA: Eccentric partial calcified plaque extending over length of at least 2.7 cm from the ostium, resulting in at least moderately severe stenosis. Patent distally, with classic distal branch anatomy. Renals: Duplicated left, superior dominant, both patent proximally. Scattered hilar and intraparenchymal atheromatous calcifications. Duplicated right, superior dominant with heavily calcified ostial plaque resulting in at least moderate short segment stenosis. Coarse hilar branch atheromatous calcifications. IMA: Mild ostial stenosis related to aortic wall plaque, patent distally. Inflow: Heavily calcified plaque just beyond the origin of the left common carotid artery resulting in stenosis of possible hemodynamic significance. There is heavy atheromatous plaque through the distal iliac arterial  system with tandem areas of at least mild stenosis in the external iliac artery. Heavily calcified plaque through the right iliac arterial system resulting in at least mild stenosis of the distal external iliac. No aneurysm or dissection. Proximal Outflow: Eccentric heavily calcified plaque in both common femoral arteries. Origin stenosis of the right SFA of possible hemodynamic significance. Veins: Patent hepatic veins, portal vein, inferior and superior mesenteric veins. Diminutive splenic vein. Patent bilateral renal veins. Review of the MIP images confirms the above findings. NON-VASCULAR Lower chest: Elevated left  hemidiaphragm. Patchy peripheral interstitial opacities in the visualized lung bases left greater than right, progressive since previous exam. Trace left pleural effusion. Heavy mitral annulus calcifications. Hepatobiliary: No focal liver abnormality is seen. Status post cholecystectomy. No biliary dilatation. Pancreas: Diffuse atrophy.  No discrete mass or ductal dilatation. Spleen: Small, without focal lesion Adrenals/Urinary Tract: Adrenal glands are unremarkable. Kidneys are normal, without renal calculi, focal lesion, or hydronephrosis. Bladder is unremarkable. Stomach/Bowel: Stomach, small bowel, colon are nondilated. There is mild wall thickening and enhancement in the distal descending and proximal sigmoid portions of the colon. Multiple sigmoid diverticula. No adjacent inflammatory/ edematous change. Lymphatic: No adenopathy localized. Reproductive: Status post hysterectomy. No adnexal masses. Other: No ascites.  No free air. Musculoskeletal: Advanced spondylitic changes in the visualized lower thoracic and lumbar spine. Negative for fracture or other worrisome bone lesion. IMPRESSION: VASCULAR 1. 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 However, the patent celiac axis and inferior mesenteric artery would typically provide adequate collateral supply to prevent  occlusive mesenteric ischemia. 2. Tandem left common and external iliac artery stenoses of possible hemodynamic significance. Correlate with ABIs and clinical symptomatology. 3. Origin stenosis of proximal right SFA, of possible hemodynamic significance. Correlate with ABIs and clinical symptomatology. 4.  Aortic Atherosclerosis (ICD10-170.0) NON-VASCULAR 1. Mild wall thickening and enhancement in the descending and proximal sigmoid portions of the colon suggesting nonspecific colitis. 2. Small left pleural effusion 3. Sigmoid diverticulosis. Electronically Signed   By: Lucrezia Europe M.D.   On: 09/14/2016 13:01    Procedures Procedures (including critical care time)   +++++++++++++++++++++++++++++++++++++++++++++++++++++++++++  CRITICAL CARE Performed by: Hoy Morn Total critical care time: 31 minutes Critical care time was exclusive of separately billable procedures and treating other patients. Critical care was necessary to treat or prevent imminent or life-threatening deterioration. Critical care was time spent personally by me on the following activities: development of treatment plan with patient and/or surrogate as well as nursing, discussions with consultants, evaluation of patient's response to treatment, examination of patient, obtaining history from patient or surrogate, ordering and performing treatments and interventions, ordering and review of laboratory studies, ordering and review of radiographic studies, pulse oximetry and re-evaluation of patient's condition.  +++++++++++++++++++++++++++++++++++++++++++++++++++++++++++++++    Medications Ordered in ED Medications  piperacillin-tazobactam (ZOSYN) IVPB 3.375 g (not administered)  sodium chloride 0.9 % bolus 1,000 mL (not administered)  vancomycin (VANCOCIN) IVPB 1000 mg/200 mL premix (not administered)  vancomycin (VANCOCIN) 500 mg in sodium chloride 0.9 % 100 mL IVPB (not administered)  sodium chloride 0.9 % bolus 2,000 mL  (2,000 mLs Intravenous New Bag/Given 09/14/16 1140)  iopamidol (ISOVUE-370) 76 % injection (100 mLs  Contrast Given 09/14/16 1230)     Initial Impression / Assessment and Plan / ED Course  I have reviewed the triage vital signs and the nursing notes.  Pertinent labs & imaging results that were available during my care of the patient were reviewed by me and considered in my medical decision making (see chart for details).     Long discussion was had with the patient and patient's family.  The patient wishes to not be intubated nor have CPR performed in case of sudden arrest while in the ER or the hospital.  Decreasing oral intake with associated hypothermia and hypotension.  Broad-spectrum antibiotics.  Blood cultures.  IV fluid resuscitation.  After 2-1/2 L of fluid her blood pressures improved to 96 systolic.  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  She left leg could be having intestinal angina and this could be playing a role in her symptomatology.  There is still the likelihood of ischemic bowel given the elevated lactate.  No peritoneal signs on examination.  She'll be admitted to the hospitalist service.   Final Clinical Impressions(s) / ED Diagnoses   Final diagnoses:  None    New Prescriptions New Prescriptions   No medications on file     Jola Schmidt, MD 09/14/16 1430

## 2016-09-14 NOTE — H&P (Signed)
History and Physical    Debbie Kramer XQJ:[id number] DOB: 12-01-1925 DOA: 09/14/2016  PCP: Haywood Pao, MD Patient coming from: Home  Chief Complaint: Generalized weakness and fatigue  HPI: Debbie Kramer is a 83 y.o. female with medical history significant of CVA, atrial fibrillation, CHF, COPD, hypertension, rheumatoid arthritis. Patient waning of multiple week history of generalized weakness and fatigue with associated poor oral intake due to low appetite. Patient reports prior to onset of symptoms that she been diagnosed with a respiratory illness and was given steroids as well as an antibiotic. She's been off those medicines now for approximately 2 weeks. Patient's only focal complaint is a intermittent productive cough for the last 2 days but denies any shortness of breath, chest pain, palpitations, dysuria, frequency, back pain, focal neurological deficit, neck stiffness, headache. Patient does have some mild abdominal discomfort from time to time which is easily relieved with passing her bowels.  ED Course: 12 L of IV fluid given after patient noted to be hypothermic and hypotensive. Broad-spectrum antibiotics started in the ED. Objective findings outlined below.  Review of Systems: As per HPI otherwise 10 point review of systems negative.   Ambulatory Status: generalized deconditioned but ambulates w/o assistance prior to admission.   Past Medical History:  Diagnosis Date  . Atrial fibrillation ([place])   . Bronchitis   . CHF (congestive heart failure) ([place])   . COPD (chronic obstructive pulmonary disease) ([place])   . CVA (cerebral vascular accident) ([place])   . Degenerative disc disease   . Hypertension   . Rheumatoid arthritis(714.0)     Past Surgical History:  Procedure Laterality Date  . CHOLECYSTECTOMY     lap choley  . HERNIA REPAIR     umbilical  . KNEE SURGERY Bilateral 1996, 1997   replacements  . LOOP RECORDER IMPLANT N/A 10/25/2014   Procedure: LOOP  RECORDER IMPLANT;  Surgeon: Deboraha Sprang, MD;  Location: [facility] CATH LAB;  Service: Cardiovascular;  Laterality: N/A;    Social History   Social History  . Marital status: Widowed    Spouse name: N/A  . Number of children: 5  . Years of education: 10   Occupational History  . Retired     retired   Social History Main Topics  . Smoking status: Former Smoker    Packs/day: 0.00    Years: 50.00    Types: Cigarettes    Quit date: 08/04/2015  . Smokeless tobacco: Not on file     Comment: Smokes a few cigarettes per day.    . Alcohol use No  . Drug use: No  . Sexual activity: Not on file   Other Topics Concern  . Not on file   Social History Narrative   Lives with daughter, Otila Kluver; widowed   Right handed   Caffeine use - none    No Known Allergies  Family History  Problem Relation Age of Onset  . Stroke Father   . Cancer Father     lung  . Cancer Mother   . Diabetes Brother   . Stroke Maternal Grandmother     Prior to Admission medications   Medication Sig Start Date End Date Taking? Authorizing Provider  albuterol (PROVENTIL HFA;VENTOLIN HFA) 108 (90 BASE) MCG/ACT inhaler Inhale 2 puffs into the lungs every 4 (four) hours as needed for wheezing or shortness of breath. 07/07/15  Yes Jola Schmidt, MD  apixaban (ELIQUIS) 2.5 MG TABS tablet Take 1 tablet (2.5 mg total) by mouth 2 (  two) times daily. 09/08/16  Yes Sherran Needs, NP  benazepril (LOTENSIN) 20 MG tablet Take 20 mg by mouth daily.     Yes Historical Provider, MD  Calcium Citrate-Vitamin D (CITRACAL PETITES/VITAMIN D PO) Take 1 tablet by mouth daily.    Yes Historical Provider, MD  feeding supplement (BOOST / RESOURCE BREEZE) LIQD Take 1 Container by mouth 3 (three) times daily between meals. 08/08/15  Yes Hosie Poisson, MD  Fluticasone Furoate-Vilanterol (BREO ELLIPTA) 100-25 MCG/INH AEPB Inhale 1 puff into the lungs daily.   Yes Historical Provider, MD  folic acid (FOLVITE) 1 MG tablet Take 1 mg by mouth daily.   Yes  Historical Provider, MD  furosemide (LASIX) 20 MG tablet Take 20 mg by mouth daily. 07/14/16  Yes Historical Provider, MD  HYDROcodone-acetaminophen (NORCO/VICODIN) 5-325 MG per tablet Take 1 tablet by mouth 2 (two) times daily.  11/24/14  Yes Historical Provider, MD  INCRUSE ELLIPTA 62.5 MCG/INH AEPB Take 1 Inhaler by mouth at bedtime. 08/16/16  Yes Historical Provider, MD  InFLIXimab (REMICADE IV) Inject 200 mg into the vein every 8 (eight) weeks.    Yes Historical Provider, MD  methotrexate 2.5 MG tablet Take 15 mg by mouth once a week.    Yes Historical Provider, MD  metoprolol tartrate (LOPRESSOR) 25 MG tablet Take 0.5 tablets (12.5 mg total) by mouth 2 (two) times daily. Please call and schedule a follow up appt for further refills [phone] Patient taking differently: Take 12.5 mg by mouth daily. Please call and schedule a follow up appt for further refills [phone] 09/03/16  Yes Minus Breeding, MD  Multiple Vitamin (MULTIVITAMIN PO) Take 1 capsule by mouth daily.    Yes Historical Provider, MD  simvastatin (ZOCOR) 10 MG tablet Take 1 tablet (10 mg total) by mouth daily at 6 PM. 10/25/14  Yes Eugenie Filler, MD  alendronate (FOSAMAX) 70 MG tablet Take 70 mg by mouth once a week. Take with a full glass of water on an empty stomach.    Historical Provider, MD    Physical Exam: Vitals:   09/14/16 1431 09/14/16 1440 09/14/16 1441 09/14/16 1452  BP: 96/57 91/77  (!) 91/53  Pulse: 80  89 80  Resp:   18 26  Temp:      TempSrc:      SpO2: 97%  98% 97%  Weight:         General: Ill-appearing, resting in bed under multiple blankets. Eyes:  PERRL, EOMI, normal lids, iris ENT: Very dry mucous membranes, normal hearing Neck:  no LAD, masses or thyromegaly Cardiovascular: Faint cardiac sounds, regular rate and rhythm, No LE edema.  Respiratory: Crackles heard at bases. Mild increased effort. No wheezes. Abdomen:  soft, ntnd, NABS Skin:  no rash or induration seen on limited  exam Musculoskeletal:  Hands with ulnar deviation of the fingers. No bony abnormalities otherwise, upper and lower extremities symmetrical with equal strength throughout.  rossly normal tone BUE/BLE, good ROM, no bony abnormality Psychiatric:  grossly normal mood and affect, speech fluent and appropriate, AOx3 Neurologic:  CN 2-12 grossly intact, moves all extremities in coordinated fashion, sensation intact  Labs on Admission: I have personally reviewed following labs and imaging studies  CBC:  Recent Labs Lab 09/14/16 1136 09/14/16 1200  WBC 19.1*  --   NEUTROABS 16.4*  --   HGB 11.9* 12.2  HCT 36.7 36.0  MCV 94.1  --   PLT 372  --    Basic Metabolic Panel:  Recent  Labs Lab 09/14/16 1200  NA 133*  K 3.7  CL 97*  GLUCOSE 232*  BUN 46*  CREATININE 1.10*   GFR: Estimated Creatinine Clearance: 28.2 mL/min (by C-G formula based on SCr of 1.1 mg/dL (H)). Liver Function Tests: No results for input(s): AST, ALT, ALKPHOS, BILITOT, PROT, ALBUMIN in the last 168 hours. No results for input(s): LIPASE, AMYLASE in the last 168 hours. No results for input(s): AMMONIA in the last 168 hours. Coagulation Profile: No results for input(s): INR, PROTIME in the last 168 hours. Cardiac Enzymes: No results for input(s): CKTOTAL, CKMB, CKMBINDEX, TROPONINI in the last 168 hours. BNP (last 3 results) No results for input(s): PROBNP in the last 8760 hours. HbA1C: No results for input(s): HGBA1C in the last 72 hours. CBG: No results for input(s): GLUCAP in the last 168 hours. Lipid Profile: No results for input(s): CHOL, HDL, LDLCALC, TRIG, CHOLHDL, LDLDIRECT in the last 72 hours. Thyroid Function Tests: No results for input(s): TSH, T4TOTAL, FREET4, T3FREE, THYROIDAB in the last 72 hours. Anemia Panel: No results for input(s): VITAMINB12, FOLATE, FERRITIN, TIBC, IRON, RETICCTPCT in the last 72 hours. Urine analysis:    Component Value Date/Time   COLORURINE AMBER (A) 08/04/2015 2025    APPEARANCEUR CLOUDY (A) 08/04/2015 2025   LABSPEC 1.025 08/04/2015 2025   PHURINE 6.0 08/04/2015 2025   GLUCOSEU NEGATIVE 08/04/2015 2025   HGBUR NEGATIVE 08/04/2015 2025   BILIRUBINUR SMALL (A) 08/04/2015 2025   KETONESUR 15 (A) 08/04/2015 2025   PROTEINUR NEGATIVE 08/04/2015 2025   UROBILINOGEN 0.2 10/23/2014 1451   NITRITE NEGATIVE 08/04/2015 2025   LEUKOCYTESUR NEGATIVE 08/04/2015 2025    Creatinine Clearance: Estimated Creatinine Clearance: 28.2 mL/min (by C-G formula based on SCr of 1.1 mg/dL (H)).  Sepsis Labs: '@LABRCNTIP'$ (procalcitonin:4,lacticidven:4) )No results found for this or any previous visit (from the past 240 hour(s)).   Radiological Exams on Admission: Dg Chest Portable 1 View  Result Date: 09/14/2016 CLINICAL DATA:  Four days of shortness of breath, nonproductive cough. History of COPD, hypertension, discontinue smoking 1 year ago. Also history of rheumatoid arthritis. EXAM: PORTABLE CHEST 1 VIEW COMPARISON:  Chest x-ray and chest CT scan of August 04, 2015 FINDINGS: There is new elevation of the right hemidiaphragm. Gas-filled bowel lies deep to it. The right lung is adequately inflated. The interstitial markings of both lungs are chronically increased. There is no alveolar infiltrate. The heart and pulmonary vascularity are normal. There is dense calcification in the wall of the thoracic aorta and in the mitral valvular annulus. There is an implanted loop recorder present. There is no pleural effusion. The observed bony thorax exhibits no acute abnormality. There is moderate degenerative change of the left shoulder. IMPRESSION: Chronic bronchitic changes. New elevation of the left hemidiaphragm. No CHF nor pneumonia. Thoracic aortic atherosclerosis. Electronically Signed   By: David  Martinique M.D.   On: 09/14/2016 13:55   Ct Angio Abd/pel W And/or Wo Contrast  Result Date: 09/14/2016 CLINICAL DATA:  Abdominal pain, decreased appetite EXAM: CTA ABDOMEN AND PELVIS WITH  CONTRAST TECHNIQUE: Multidetector CT imaging of the abdomen and pelvis was performed using the standard protocol during bolus administration of intravenous contrast. Multiplanar reconstructed images and MIPs were obtained and reviewed to evaluate the vascular anatomy. CONTRAST:  100 mL Isovue-300 IV COMPARISON:  04/29/2010 FINDINGS: VASCULAR Coronary calcifications. Aorta: Tortuous atheromatous visualized distal descending thoracic segment. Coarse atheromatous calcifications through the abdominal aorta without aneurysm, dissection, or stenosis. Infrarenal segment ectatic up to 2.8 cm diameter. Celiac: Partially calcified  ostial plaque without significant stenosis. Patent distally with unremarkable branch anatomy. SMA: Eccentric partial calcified plaque extending over length of at least 2.7 cm from the ostium, resulting in at least moderately severe stenosis. Patent distally, with classic distal branch anatomy. Renals: Duplicated left, superior dominant, both patent proximally. Scattered hilar and intraparenchymal atheromatous calcifications. Duplicated right, superior dominant with heavily calcified ostial plaque resulting in at least moderate short segment stenosis. Coarse hilar branch atheromatous calcifications. IMA: Mild ostial stenosis related to aortic wall plaque, patent distally. Inflow: Heavily calcified plaque just beyond the origin of the left common carotid artery resulting in stenosis of possible hemodynamic significance. There is heavy atheromatous plaque through the distal iliac arterial system with tandem areas of at least mild stenosis in the external iliac artery. Heavily calcified plaque through the right iliac arterial system resulting in at least mild stenosis of the distal external iliac. No aneurysm or dissection. Proximal Outflow: Eccentric heavily calcified plaque in both common femoral arteries. Origin stenosis of the right SFA of possible hemodynamic significance. Veins: Patent hepatic  veins, portal vein, inferior and superior mesenteric veins. Diminutive splenic vein. Patent bilateral renal veins. Review of the MIP images confirms the above findings. NON-VASCULAR Lower chest: Elevated left hemidiaphragm. Patchy peripheral interstitial opacities in the visualized lung bases left greater than right, progressive since previous exam. Trace left pleural effusion. Heavy mitral annulus calcifications. Hepatobiliary: No focal liver abnormality is seen. Status post cholecystectomy. No biliary dilatation. Pancreas: Diffuse atrophy.  No discrete mass or ductal dilatation. Spleen: Small, without focal lesion Adrenals/Urinary Tract: Adrenal glands are unremarkable. Kidneys are normal, without renal calculi, focal lesion, or hydronephrosis. Bladder is unremarkable. Stomach/Bowel: Stomach, small bowel, colon are nondilated. There is mild wall thickening and enhancement in the distal descending and proximal sigmoid portions of the colon. Multiple sigmoid diverticula. No adjacent inflammatory/ edematous change. Lymphatic: No adenopathy localized. Reproductive: Status post hysterectomy. No adnexal masses. Other: No ascites.  No free air. Musculoskeletal: Advanced spondylitic changes in the visualized lower thoracic and lumbar spine. Negative for fracture or other worrisome bone lesion. IMPRESSION: VASCULAR 1. 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 However, the patent celiac axis and inferior mesenteric artery would typically provide adequate collateral supply to prevent occlusive mesenteric ischemia. 2. Tandem left common and external iliac artery stenoses of possible hemodynamic significance. Correlate with ABIs and clinical symptomatology. 3. Origin stenosis of proximal right SFA, of possible hemodynamic significance. Correlate with ABIs and clinical symptomatology. 4.  Aortic Atherosclerosis (ICD10-170.0) NON-VASCULAR 1. Mild wall thickening and enhancement in the descending  and proximal sigmoid portions of the colon suggesting nonspecific colitis. 2. Small left pleural effusion 3. Sigmoid diverticulosis. Electronically Signed   By: Lucrezia Europe M.D.   On: 09/14/2016 13:01    EKG: Independently reviewed. LAFB, sinus  Assessment/Plan Active Problems:   Rheumatoid arthritis ([place])   Essential hypertension   CAP (community acquired pneumonia)   Paroxysmal atrial fibrillation ([facility])   Sepsis ([facility])   Hypotension   SMA stenosis ([facility])   AKI (acute kidney injury) ([place])   Hyperglycemia   Chronic combined systolic and diastolic CHF (congestive heart failure) ([facility])   Sepsis: Hypotensive, hypothermic, WBC 19.1 with left shift, lactic acid 8.31, 2 day history of mildly productive cough with several week history of worsening generalized weakness and anorexia. CT angio of the abdomen and pelvis with central arterial disease as outlined below. Not likely to be contributing at this time after discussing the case with Dr. Tawni Millers of vascular  surgery. Doubt bowel ischemia. Suspect after IVF that CXR may show pneumonia. No Urine production for UA.  - SDU - UA, UCX, BCX - Continue Vanc/Zosyn - Procalcitonin, res viral panel, trend lactic acid - sepsis/pneumonia orderset - IVF  Hypertension: hypotensive on admission - Hold metop and benazepril - IVF - Stress dose steroids for 24 rhs due to recent outpt steroid use.   Arterial stenosis: Proximal stenosis of SMA, left common and external iliac artery stenosis. Discussed findings with on-call vascular surgeon, Dr. early and feels that these changes are likely chronic and noncooperative to current symptoms. Patient denies any postprandial issues. Patient with strong pedal pulses bilaterally. Patient does not need emergent evaluation and would only need outpatient follow-up if becomes symptomatic. No need for formal consult at this time.  AKI: mild. Cr 1.1. Baseline 0.6. Likely from poor oral intake -  IVF  - BMET in  am  Hyperglycemia: 232 on admission. No h/o DM. Recent stress and steroids (2 wks ago), and infection not likely to elevate glucose above 200.  - SSI (sensitive) - A1c  Chronic combined systolic and diastolic congestive heart failure: Last echo showing an EF of 40% and grade 1 diastolic dysfunction. No evidence of acute conversation at this time. Monitor closely given aggressive fluid administration due to hypotension. - Strict I's and O's, daily weights - Hold Lasix due to hypotension and sepsis - Echo  COPD: at baseline - continue Ellipta  Afib:  - continue Eliquis, - Hold Metop   Protein calorie malnutrition:  - continue ensure  RA: well controlled - Continue Remicade outpt - COntinue methotrexate (due Saturday)  HLD: - continue statin  Chronic pain: - continuie norco  DVT prophylaxis: Eliquis Code Status: DNR  Family Communication: Daughter  Disposition Plan: pending improvement   Consults called: Vascular - by phone only  Admission status: inpt    MERRELL, DAVID J MD [facility]  If 7PM-7AM, please contact night-coverage [email or web address] Password [facility]  09/14/2016, 3:57 PM

## 2016-09-14 NOTE — ED Notes (Signed)
Placed pt on 2 L nasal cannula.  SpO2 RA was 88-89%

## 2016-09-14 NOTE — ED Notes (Signed)
Dr Venora Maples updated on pt's BP status.

## 2016-09-15 ENCOUNTER — Ambulatory Visit (INDEPENDENT_AMBULATORY_CARE_PROVIDER_SITE_OTHER): Payer: Medicare Other | Admitting: *Deleted

## 2016-09-15 ENCOUNTER — Inpatient Hospital Stay (HOSPITAL_COMMUNITY): Payer: Medicare Other

## 2016-09-15 ENCOUNTER — Inpatient Hospital Stay (HOSPITAL_COMMUNITY): Admission: RE | Admit: 2016-09-15 | Payer: Medicare Other | Source: Ambulatory Visit | Admitting: Nurse Practitioner

## 2016-09-15 DIAGNOSIS — I639 Cerebral infarction, unspecified: Secondary | ICD-10-CM | POA: Diagnosis not present

## 2016-09-15 DIAGNOSIS — I771 Stricture of artery: Secondary | ICD-10-CM

## 2016-09-15 DIAGNOSIS — I10 Essential (primary) hypertension: Secondary | ICD-10-CM

## 2016-09-15 DIAGNOSIS — J181 Lobar pneumonia, unspecified organism: Secondary | ICD-10-CM

## 2016-09-15 DIAGNOSIS — I509 Heart failure, unspecified: Secondary | ICD-10-CM

## 2016-09-15 LAB — MAGNESIUM: Magnesium: 1.9 mg/dL (ref 1.7–2.4)

## 2016-09-15 LAB — CBC
HEMATOCRIT: 29.6 % — AB (ref 36.0–46.0)
HEMOGLOBIN: 9.6 g/dL — AB (ref 12.0–15.0)
MCH: 29.8 pg (ref 26.0–34.0)
MCHC: 32.4 g/dL (ref 30.0–36.0)
MCV: 91.9 fL (ref 78.0–100.0)
Platelets: 280 10*3/uL (ref 150–400)
RBC: 3.22 MIL/uL — ABNORMAL LOW (ref 3.87–5.11)
RDW: 15.4 % (ref 11.5–15.5)
WBC: 12 10*3/uL — ABNORMAL HIGH (ref 4.0–10.5)

## 2016-09-15 LAB — COMPREHENSIVE METABOLIC PANEL
ALK PHOS: 59 U/L (ref 38–126)
ALT: 24 U/L (ref 14–54)
ANION GAP: 9 (ref 5–15)
AST: 29 U/L (ref 15–41)
Albumin: 1.9 g/dL — ABNORMAL LOW (ref 3.5–5.0)
BILIRUBIN TOTAL: 0.6 mg/dL (ref 0.3–1.2)
BUN: 27 mg/dL — ABNORMAL HIGH (ref 6–20)
CALCIUM: 7.2 mg/dL — AB (ref 8.9–10.3)
CO2: 21 mmol/L — ABNORMAL LOW (ref 22–32)
Chloride: 106 mmol/L (ref 101–111)
Creatinine, Ser: 0.74 mg/dL (ref 0.44–1.00)
GLUCOSE: 121 mg/dL — AB (ref 65–99)
POTASSIUM: 2.8 mmol/L — AB (ref 3.5–5.1)
Sodium: 136 mmol/L (ref 135–145)
TOTAL PROTEIN: 4.9 g/dL — AB (ref 6.5–8.1)

## 2016-09-15 LAB — RESPIRATORY PANEL BY PCR
ADENOVIRUS-RVPPCR: NOT DETECTED
Bordetella pertussis: NOT DETECTED
CHLAMYDOPHILA PNEUMONIAE-RVPPCR: NOT DETECTED
CORONAVIRUS HKU1-RVPPCR: NOT DETECTED
CORONAVIRUS NL63-RVPPCR: NOT DETECTED
Coronavirus 229E: NOT DETECTED
Coronavirus OC43: NOT DETECTED
INFLUENZA A-RVPPCR: NOT DETECTED
Influenza B: NOT DETECTED
MYCOPLASMA PNEUMONIAE-RVPPCR: NOT DETECTED
Metapneumovirus: NOT DETECTED
PARAINFLUENZA VIRUS 1-RVPPCR: NOT DETECTED
PARAINFLUENZA VIRUS 3-RVPPCR: NOT DETECTED
PARAINFLUENZA VIRUS 4-RVPPCR: NOT DETECTED
Parainfluenza Virus 2: NOT DETECTED
Respiratory Syncytial Virus: NOT DETECTED
Rhinovirus / Enterovirus: NOT DETECTED

## 2016-09-15 LAB — GLUCOSE, CAPILLARY
GLUCOSE-CAPILLARY: 144 mg/dL — AB (ref 65–99)
Glucose-Capillary: 195 mg/dL — ABNORMAL HIGH (ref 65–99)
Glucose-Capillary: 138 mg/dL — ABNORMAL HIGH (ref 65–99)
Glucose-Capillary: 161 mg/dL — ABNORMAL HIGH (ref 65–99)

## 2016-09-15 LAB — MRSA PCR SCREENING: MRSA BY PCR: NEGATIVE

## 2016-09-15 LAB — POTASSIUM: Potassium: 3.7 mmol/L (ref 3.5–5.1)

## 2016-09-15 LAB — URINE CULTURE

## 2016-09-15 LAB — ECHOCARDIOGRAM COMPLETE
Height: 60 in
Weight: 1702.4 oz

## 2016-09-15 LAB — HEMOGLOBIN A1C
HEMOGLOBIN A1C: 5.4 % (ref 4.8–5.6)
MEAN PLASMA GLUCOSE: 108 mg/dL

## 2016-09-15 LAB — HIV ANTIBODY (ROUTINE TESTING W REFLEX): HIV Screen 4th Generation wRfx: NONREACTIVE

## 2016-09-15 LAB — LACTIC ACID, PLASMA: LACTIC ACID, VENOUS: 2.2 mmol/L — AB (ref 0.5–1.9)

## 2016-09-15 MED ORDER — POTASSIUM CHLORIDE CRYS ER 20 MEQ PO TBCR
60.0000 meq | EXTENDED_RELEASE_TABLET | Freq: Once | ORAL | Status: AC
  Administered 2016-09-15: 60 meq via ORAL
  Filled 2016-09-15: qty 3

## 2016-09-15 MED ORDER — HYDROCORTISONE NA SUCCINATE PF 100 MG IJ SOLR
100.0000 mg | Freq: Four times a day (QID) | INTRAMUSCULAR | Status: DC
  Administered 2016-09-16: 100 mg via INTRAVENOUS
  Filled 2016-09-15 (×2): qty 2

## 2016-09-15 MED ORDER — SODIUM CHLORIDE 0.9 % IV BOLUS (SEPSIS)
1000.0000 mL | Freq: Once | INTRAVENOUS | Status: AC
  Administered 2016-09-15: 1000 mL via INTRAVENOUS

## 2016-09-15 MED ORDER — ZOLPIDEM TARTRATE 5 MG PO TABS
2.5000 mg | ORAL_TABLET | Freq: Once | ORAL | Status: DC
  Filled 2016-09-15: qty 1

## 2016-09-15 NOTE — Progress Notes (Signed)
  Echocardiogram 2D Echocardiogram has been performed.  Tresa Res 09/15/2016, 1:51 PM

## 2016-09-15 NOTE — Progress Notes (Signed)
PROGRESS NOTE    Debbie Kramer  VCB:[id number] DOB: 1926/07/20 DOA: 09/14/2016 PCP: Haywood Pao, MD   Brief Narrative:  83 y.o.WF PMHx CVA, Atrial fibrillation, chronic systolic and diastolic CHF (Insertable Cardiac Monitor/Implantable Loop Recorder present), COPD, HTN, RA,   Patient waning of multiple week history of generalized weakness and fatigue with associated poor oral intake due to low cardiac monitor/ appetite. Patient reports prior to onset of symptoms that she been diagnosed with a respiratory illness and was given steroids as well as an antibiotic. She's been off those medicines now for approximately 2 weeks. Patient's only focal complaint is a intermittent productive cough for the last 2 days but denies any shortness of breath, chest pain, palpitations, dysuria, frequency, back pain, focal neurological deficit, neck stiffness, headache. Patient does have some mild abdominal discomfort from time to time which is easily relieved with passing her bowels.   Subjective: 2/13 A/O 4. NAD. Patient asked to be allowed to sit in chair during the day.   Assessment & Plan:   Active Problems:   Rheumatoid arthritis ([place])   Essential hypertension   CAP (community acquired pneumonia)   Paroxysmal atrial fibrillation (HCC)   Sepsis (HCC)   Hypotension   SMA stenosis (HCC)   AKI (acute kidney injury) ([place])   Hyperglycemia   Chronic combined systolic and diastolic CHF (congestive heart failure) (HCC)   Sepsis unspecified organism:/ CAP -Hypotensive, hypothermic, WBC 19.1 with left shift, lactic acid 8.31,  - CT angio of the abdomen and pelvis with central arterial disease as outlined below. Not likely to be contributing at this time after discussing the case with Dr. Tawni Millers of vascular surgery. Doubt bowel ischemia. Suspect after IVF that CXR may show pneumonia. No Urine production for UA.  -Normal saline 146m/hr  -Continue current antibiotics  Chronic Diastolic congestive  heart failure:  Last echo showing an EF of 40% and grade 1 diastolic dysfunction. No evidence of acute conversation at this time. Monitor closely given aggressive fluid administration due to hypotension. - Strict I's and O's, since admission +7.3 L - daily weights Filed Weights   09/14/16 1312 09/14/16 2055  Weight: 58.1 kg (128 lb 1.4 oz) 48.3 kg (106 lb 6.4 oz)  - Hold Lasix due to hypotension and sepsis - Echo: See results below   Hypertension:  -hypotensive on admission: Continues to be soft - Hold BP medication -Solu-Cortef 100 mg QID  Arterial stenosis:  -Proximal stenosis of SMA, left common and external iliac artery stenosis.  -Per chart findings discussed with on-call vascular surgeon, Dr. EDonnetta Hutchingand feels that these changes are likely chronic and noncooperative to current symptoms.  -Patient denies any postprandial issues. Patient with strong pedal pulses bilaterally. Patient does not need emergent evaluation. - outpatient follow-up if becomes symptomatic. .  AKI:( Baseline 0.6) Lab Results  Component Value Date   CREATININE 0.74 09/15/2016   CREATININE 0.90 09/14/2016   CREATININE 1.10 (H) 09/14/2016   Hyperglycemia:  -2/12 Hemoglobin A1c= 5.4 -232 on admission.  Recent stress and steroids (2 wks ago), and infection not likely to elevate glucose above 200.  - SSI (sensitive)   COPD:  -at baseline - continue Ellipta  Paroxysmal A-Fib:  - continue Eliquis, - Hold Metoprolol  -Currently in NSR   Protein calorie malnutrition:  - continue ensure  Dysphagia? -Swallow study pending  RA:  -well controlled - Continue Remicade outpt - COntinue methotrexate (due Saturday)  HLD: - continue statin  Chronic pain: - continuie norco  Hypokalemia -Potassium goal> 4  -K-Dur 60 mEq -Recheck K/Mg at 1500   DVT prophylaxis: Eliquis Code Status: DO NOT RESUSCITATE Family Communication: None Disposition Plan: Resolution sepsis   Consultants:    None  Procedures/Significant Events:  10/13 Echocardiogram;Left ventricle: moderate focal basal and mild concentric hypertrophy.  -LVEF = 60% to 65%. - (grade 2 diastolic dysfunction).     VENTILATOR SETTINGS:    Cultures 2/12 respiratory virus panel negative 2/12 blood NGTD 2/12 urine positive multiple species      Antimicrobials: Anti-infectives    Start     Stop   09/15/16 1430  vancomycin (VANCOCIN) 500 mg in sodium chloride 0.9 % 100 mL IVPB         09/14/16 2200  piperacillin-tazobactam (ZOSYN) IVPB 3.375 g         09/14/16 1345  vancomycin (VANCOCIN) IVPB 1000 mg/200 mL premix     09/14/16 1646   09/14/16 1315  piperacillin-tazobactam (ZOSYN) IVPB 3.375 g     09/14/16 1649       Devices   LINES / TUBES:      Continuous Infusions: . sodium chloride 75 mL/hr at 09/14/16 1615     Objective: Vitals:   09/14/16 2019 09/14/16 2055 09/15/16 0014 09/15/16 0415  BP:  (!) 84/52 118/80 (!) 101/54  Pulse: 79 75 79 76  Resp: 21 (!) 23 (!) 22 16  Temp:  97.9 F (36.6 C) 97.6 F (36.4 C) 97.9 F (36.6 C)  TempSrc:  Oral Oral Oral  SpO2: 100% 99% 98% 98%  Weight:  48.3 kg (106 lb 6.4 oz)    Height:  5' (1.524 m)      Intake/Output Summary (Last 24 hours) at 09/15/16 0817 Last data filed at 09/15/16 0600  Gross per 24 hour  Intake          4321.25 ml  Output                0 ml  Net          4321.25 ml   Filed Weights   09/14/16 1312 09/14/16 2055  Weight: 58.1 kg (128 lb 1.4 oz) 48.3 kg (106 lb 6.4 oz)    Examination:  General: A/O 4, NAD, No acute respiratory distress Eyes: negative scleral hemorrhage, negative anisocoria, negative icterus ENT: Negative Runny nose, negative gingival bleeding, Neck:  Negative scars, masses, torticollis, lymphadenopathy, JVD Lungs: upper airway rhonchi, without wheezes or crackles Cardiovascular: Regular rate and rhythm without murmur gallop or rub normal S1 and S2 Abdomen: negative abdominal pain,  nondistended, positive soft, bowel sounds, no rebound, no ascites, no appreciable mass Extremities: No significant cyanosis, clubbing, or edema bilateral lower extremities Skin: Negative rashes, lesions, ulcers Psychiatric:  Negative depression, negative anxiety, negative fatigue, negative mania  Central nervous system:  Cranial nerves II through XII intact, tongue/uvula midline, all extremities muscle strength 5/5, sensation intact throughout, , negative dysarthria, negative expressive aphasia, negative receptive aphasia.  .     Data Reviewed: Care during the described time interval was provided by me .  I have reviewed this patient's available data, including medical history, events of note, physical examination, and all test results as part of my evaluation. I have personally reviewed and interpreted all radiology studies.  CBC:  Recent Labs Lab 09/14/16 1136 09/14/16 1200 09/15/16 0418  WBC 19.1*  --  12.0*  NEUTROABS 16.4*  --   --   HGB 11.9* 12.2 9.6*  HCT 36.7 36.0 29.6*  MCV 94.1  --  91.9  PLT 372  --  706   Basic Metabolic Panel:  Recent Labs Lab 09/14/16 1200 09/14/16 1521 09/14/16 1709 09/15/16 0418  NA 133* 136  --  136  K 3.7 3.3*  --  2.8*  CL 97* 104  --  106  CO2  --  20*  --  21*  GLUCOSE 232* 140*  --  121*  BUN 46* 35*  --  27*  CREATININE 1.10* 0.90  --  0.74  CALCIUM  --  7.7*  --  7.2*  MG  --   --  1.9  --   PHOS  --   --  3.3  --    GFR: Estimated Creatinine Clearance: 34.9 mL/min (by C-G formula based on SCr of 0.74 mg/dL). Liver Function Tests:  Recent Labs Lab 09/14/16 1521 09/15/16 0418  AST 39 29  ALT 25 24  ALKPHOS 62 59  BILITOT 0.9 0.6  PROT 5.0* 4.9*  ALBUMIN 2.0* 1.9*    Recent Labs Lab 09/14/16 1521  LIPASE 22   No results for input(s): AMMONIA in the last 168 hours. Coagulation Profile:  Recent Labs Lab 09/14/16 1709  INR 1.86   Cardiac Enzymes: No results for input(s): CKTOTAL, CKMB, CKMBINDEX, TROPONINI  in the last 168 hours. BNP (last 3 results) No results for input(s): PROBNP in the last 8760 hours. HbA1C:  Recent Labs  09/14/16 1621  HGBA1C 5.4   CBG:  Recent Labs Lab 09/14/16 1701 09/14/16 2153 09/15/16 0746  GLUCAP 176* 103* 144*   Lipid Profile: No results for input(s): CHOL, HDL, LDLCALC, TRIG, CHOLHDL, LDLDIRECT in the last 72 hours. Thyroid Function Tests: No results for input(s): TSH, T4TOTAL, FREET4, T3FREE, THYROIDAB in the last 72 hours. Anemia Panel: No results for input(s): VITAMINB12, FOLATE, FERRITIN, TIBC, IRON, RETICCTPCT in the last 72 hours. Urine analysis:    Component Value Date/Time   COLORURINE YELLOW 09/14/2016 1647   APPEARANCEUR CLOUDY (A) 09/14/2016 1647   LABSPEC 1.025 09/14/2016 1647   PHURINE 5.0 09/14/2016 1647   GLUCOSEU NEGATIVE 09/14/2016 1647   HGBUR LARGE (A) 09/14/2016 1647   BILIRUBINUR NEGATIVE 09/14/2016 1647   KETONESUR NEGATIVE 09/14/2016 1647   PROTEINUR NEGATIVE 09/14/2016 1647   UROBILINOGEN 0.2 10/23/2014 1451   NITRITE NEGATIVE 09/14/2016 1647   LEUKOCYTESUR SMALL (A) 09/14/2016 1647   Sepsis Labs: '@LABRCNTIP'$ (procalcitonin:4,lacticidven:4)  ) Recent Results (from the past 240 hour(s))  MRSA PCR Screening     Status: None   Collection Time: 09/14/16 10:03 PM  Result Value Ref Range Status   MRSA by PCR NEGATIVE NEGATIVE Final    Comment:        The GeneXpert MRSA Assay (FDA approved for NASAL specimens only), is one component of a comprehensive MRSA colonization surveillance program. It is not intended to diagnose MRSA infection nor to guide or monitor treatment for MRSA infections.          Radiology Studies: Dg Chest Port 1 View  Result Date: 09/15/2016 CLINICAL DATA:  Sepsis, cough, COPD, CHF, hypertension EXAM: PORTABLE CHEST 1 VIEW COMPARISON:  Portable exam 0628 hours compared to 09/14/2016 and correlated with a prior chest CT 08/04/2015 FINDINGS: Loop recorder projects over heart. Normal  heart size and pulmonary vascularity. Dense atherosclerotic calcification aorta. Abnormal soft tissue density in medial LEFT upper lobe, question mass; patient had a paramediastinal density in the LEFT upper lobe on a prior CT exam. Volume loss LEFT chest with elevation of LEFT diaphragm and linear subsegmental atelectasis versus scarring.  RIGHT lung grossly clear. No definite pleural effusion or pneumothorax. Bones demineralized with LEFT glenohumeral degenerative changes noted. IMPRESSION: Persistent volume loss in LEFT hemithorax with atelectasis versus scarring. Abnormal soft tissue density in the medial LEFT upper lobe question mass; patient had a paramediastinal nodular density in this region on a prior CT exam and further evaluation by CT chest with contrast is recommended. Electronically Signed   By: Lavonia Dana M.D.   On: 09/15/2016 07:30   Dg Chest Portable 1 View  Result Date: 09/14/2016 CLINICAL DATA:  Four days of shortness of breath, nonproductive cough. History of COPD, hypertension, discontinue smoking 1 year ago. Also history of rheumatoid arthritis. EXAM: PORTABLE CHEST 1 VIEW COMPARISON:  Chest x-ray and chest CT scan of August 04, 2015 FINDINGS: There is new elevation of the right hemidiaphragm. Gas-filled bowel lies deep to it. The right lung is adequately inflated. The interstitial markings of both lungs are chronically increased. There is no alveolar infiltrate. The heart and pulmonary vascularity are normal. There is dense calcification in the wall of the thoracic aorta and in the mitral valvular annulus. There is an implanted loop recorder present. There is no pleural effusion. The observed bony thorax exhibits no acute abnormality. There is moderate degenerative change of the left shoulder. IMPRESSION: Chronic bronchitic changes. New elevation of the left hemidiaphragm. No CHF nor pneumonia. Thoracic aortic atherosclerosis. Electronically Signed   By: David  Martinique M.D.   On: 09/14/2016  13:55   Ct Angio Abd/pel W And/or Wo Contrast  Result Date: 09/14/2016 CLINICAL DATA:  Abdominal pain, decreased appetite EXAM: CTA ABDOMEN AND PELVIS WITH CONTRAST TECHNIQUE: Multidetector CT imaging of the abdomen and pelvis was performed using the standard protocol during bolus administration of intravenous contrast. Multiplanar reconstructed images and MIPs were obtained and reviewed to evaluate the vascular anatomy. CONTRAST:  100 mL Isovue-300 IV COMPARISON:  04/29/2010 FINDINGS: VASCULAR Coronary calcifications. Aorta: Tortuous atheromatous visualized distal descending thoracic segment. Coarse atheromatous calcifications through the abdominal aorta without aneurysm, dissection, or stenosis. Infrarenal segment ectatic up to 2.8 cm diameter. Celiac: Partially calcified ostial plaque without significant stenosis. Patent distally with unremarkable branch anatomy. SMA: Eccentric partial calcified plaque extending over length of at least 2.7 cm from the ostium, resulting in at least moderately severe stenosis. Patent distally, with classic distal branch anatomy. Renals: Duplicated left, superior dominant, both patent proximally. Scattered hilar and intraparenchymal atheromatous calcifications. Duplicated right, superior dominant with heavily calcified ostial plaque resulting in at least moderate short segment stenosis. Coarse hilar branch atheromatous calcifications. IMA: Mild ostial stenosis related to aortic wall plaque, patent distally. Inflow: Heavily calcified plaque just beyond the origin of the left common carotid artery resulting in stenosis of possible hemodynamic significance. There is heavy atheromatous plaque through the distal iliac arterial system with tandem areas of at least mild stenosis in the external iliac artery. Heavily calcified plaque through the right iliac arterial system resulting in at least mild stenosis of the distal external iliac. No aneurysm or dissection. Proximal Outflow:  Eccentric heavily calcified plaque in both common femoral arteries. Origin stenosis of the right SFA of possible hemodynamic significance. Veins: Patent hepatic veins, portal vein, inferior and superior mesenteric veins. Diminutive splenic vein. Patent bilateral renal veins. Review of the MIP images confirms the above findings. NON-VASCULAR Lower chest: Elevated left hemidiaphragm. Patchy peripheral interstitial opacities in the visualized lung bases left greater than right, progressive since previous exam. Trace left pleural effusion. Heavy mitral annulus calcifications. Hepatobiliary: No focal liver abnormality  is seen. Status post cholecystectomy. No biliary dilatation. Pancreas: Diffuse atrophy.  No discrete mass or ductal dilatation. Spleen: Small, without focal lesion Adrenals/Urinary Tract: Adrenal glands are unremarkable. Kidneys are normal, without renal calculi, focal lesion, or hydronephrosis. Bladder is unremarkable. Stomach/Bowel: Stomach, small bowel, colon are nondilated. There is mild wall thickening and enhancement in the distal descending and proximal sigmoid portions of the colon. Multiple sigmoid diverticula. No adjacent inflammatory/ edematous change. Lymphatic: No adenopathy localized. Reproductive: Status post hysterectomy. No adnexal masses. Other: No ascites.  No free air. Musculoskeletal: Advanced spondylitic changes in the visualized lower thoracic and lumbar spine. Negative for fracture or other worrisome bone lesion. IMPRESSION: VASCULAR 1. 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 However, the patent celiac axis and inferior mesenteric artery would typically provide adequate collateral supply to prevent occlusive mesenteric ischemia. 2. Tandem left common and external iliac artery stenoses of possible hemodynamic significance. Correlate with ABIs and clinical symptomatology. 3. Origin stenosis of proximal right SFA, of possible hemodynamic significance.  Correlate with ABIs and clinical symptomatology. 4.  Aortic Atherosclerosis (ICD10-170.0) NON-VASCULAR 1. Mild wall thickening and enhancement in the descending and proximal sigmoid portions of the colon suggesting nonspecific colitis. 2. Small left pleural effusion 3. Sigmoid diverticulosis. Electronically Signed   By: Lucrezia Europe M.D.   On: 09/14/2016 13:01        Scheduled Meds: . apixaban  2.5 mg Oral BID  . feeding supplement  1 Container Oral TID BM  . HYDROcodone-acetaminophen  1 tablet Oral BID  . hydrocortisone sod succinate (SOLU-CORTEF) inj  50 mg Intravenous Q6H  . insulin aspart  0-5 Units Subcutaneous QHS  . insulin aspart  0-9 Units Subcutaneous TID WC  . [START ON 09/19/2016] methotrexate  15 mg Oral Q Sat  . piperacillin-tazobactam (ZOSYN)  IV  3.375 g Intravenous Q8H  . simvastatin  10 mg Oral q1800  . sodium chloride flush  3 mL Intravenous Q12H  . vancomycin  500 mg Intravenous Q24H  . zolpidem  2.5 mg Oral Once   Continuous Infusions: . sodium chloride 75 mL/hr at 09/14/16 1615     LOS: 1 day    Time spent: 40 minutes    Stefana Lodico, Geraldo Docker, MD [facility] Pager [phone]   If 7PM-7AM, please contact night-coverage [email or web address] Password [facility] 09/15/2016, 8:17 AM

## 2016-09-15 NOTE — Progress Notes (Signed)
Carelink Summary Report / Loop Recorder 

## 2016-09-16 ENCOUNTER — Encounter (HOSPITAL_COMMUNITY): Payer: Self-pay | Admitting: Radiology

## 2016-09-16 ENCOUNTER — Inpatient Hospital Stay (HOSPITAL_COMMUNITY): Payer: Medicare Other

## 2016-09-16 DIAGNOSIS — J189 Pneumonia, unspecified organism: Secondary | ICD-10-CM

## 2016-09-16 DIAGNOSIS — N179 Acute kidney failure, unspecified: Secondary | ICD-10-CM

## 2016-09-16 DIAGNOSIS — I480 Paroxysmal atrial fibrillation: Secondary | ICD-10-CM

## 2016-09-16 DIAGNOSIS — I9589 Other hypotension: Secondary | ICD-10-CM

## 2016-09-16 DIAGNOSIS — R739 Hyperglycemia, unspecified: Secondary | ICD-10-CM

## 2016-09-16 LAB — CBC WITH DIFFERENTIAL/PLATELET
BASOS ABS: 0 10*3/uL (ref 0.0–0.1)
BASOS PCT: 0 %
EOS ABS: 0 10*3/uL (ref 0.0–0.7)
EOS PCT: 0 %
HCT: 29.9 % — ABNORMAL LOW (ref 36.0–46.0)
Hemoglobin: 9.4 g/dL — ABNORMAL LOW (ref 12.0–15.0)
Lymphocytes Relative: 5 %
Lymphs Abs: 0.6 10*3/uL — ABNORMAL LOW (ref 0.7–4.0)
MCH: 29.7 pg (ref 26.0–34.0)
MCHC: 31.4 g/dL (ref 30.0–36.0)
MCV: 94.6 fL (ref 78.0–100.0)
Monocytes Absolute: 0.2 10*3/uL (ref 0.1–1.0)
Monocytes Relative: 2 %
Neutro Abs: 10.7 10*3/uL — ABNORMAL HIGH (ref 1.7–7.7)
Neutrophils Relative %: 93 %
PLATELETS: 297 10*3/uL (ref 150–400)
RBC: 3.16 MIL/uL — AB (ref 3.87–5.11)
RDW: 16.1 % — ABNORMAL HIGH (ref 11.5–15.5)
WBC: 11.5 10*3/uL — AB (ref 4.0–10.5)

## 2016-09-16 LAB — BASIC METABOLIC PANEL
Anion gap: 6 (ref 5–15)
BUN: 14 mg/dL (ref 6–20)
CALCIUM: 7.1 mg/dL — AB (ref 8.9–10.3)
CHLORIDE: 110 mmol/L (ref 101–111)
CO2: 23 mmol/L (ref 22–32)
CREATININE: 0.75 mg/dL (ref 0.44–1.00)
GFR calc non Af Amer: >60 mL/min (ref >60)
Glucose, Bld: 170 mg/dL — ABNORMAL HIGH (ref 65–99)
Potassium: 4.6 mmol/L (ref 3.5–5.1)
SODIUM: 139 mmol/L (ref 135–145)

## 2016-09-16 LAB — LEGIONELLA PNEUMOPHILA SEROGP 1 UR AG: L. pneumophila Serogp 1 Ur Ag: NEGATIVE

## 2016-09-16 LAB — GLUCOSE, CAPILLARY
GLUCOSE-CAPILLARY: 140 mg/dL — AB (ref 65–99)
GLUCOSE-CAPILLARY: 139 mg/dL — AB (ref 65–99)
GLUCOSE-CAPILLARY: 139 mg/dL — AB (ref 65–99)
Glucose-Capillary: 148 mg/dL — ABNORMAL HIGH (ref 65–99)

## 2016-09-16 LAB — LACTIC ACID, PLASMA: LACTIC ACID, VENOUS: 1.9 mmol/L (ref 0.5–1.9)

## 2016-09-16 LAB — MAGNESIUM: MAGNESIUM: 1.8 mg/dL (ref 1.7–2.4)

## 2016-09-16 MED ORDER — IPRATROPIUM-ALBUTEROL 0.5-2.5 (3) MG/3ML IN SOLN
3.0000 mL | Freq: Three times a day (TID) | RESPIRATORY_TRACT | Status: DC
  Administered 2016-09-16 – 2016-09-19 (×7): 3 mL via RESPIRATORY_TRACT
  Filled 2016-09-16 (×8): qty 3

## 2016-09-16 MED ORDER — FOLIC ACID 1 MG PO TABS
1.0000 mg | ORAL_TABLET | Freq: Every day | ORAL | Status: DC
  Administered 2016-09-16 – 2016-09-18 (×3): 1 mg via ORAL
  Filled 2016-09-16 (×3): qty 1

## 2016-09-16 MED ORDER — IPRATROPIUM-ALBUTEROL 0.5-2.5 (3) MG/3ML IN SOLN
3.0000 mL | Freq: Four times a day (QID) | RESPIRATORY_TRACT | Status: DC
  Filled 2016-09-16 (×2): qty 3

## 2016-09-16 MED ORDER — ACETAMINOPHEN 500 MG PO TABS
1000.0000 mg | ORAL_TABLET | Freq: Three times a day (TID) | ORAL | Status: DC | PRN
  Administered 2016-09-16: 1000 mg via ORAL
  Filled 2016-09-16: qty 2

## 2016-09-16 MED ORDER — ALBUTEROL SULFATE (2.5 MG/3ML) 0.083% IN NEBU
2.5000 mg | INHALATION_SOLUTION | RESPIRATORY_TRACT | Status: DC | PRN
  Filled 2016-09-16: qty 3

## 2016-09-16 MED ORDER — PIPERACILLIN-TAZOBACTAM 3.375 G IVPB
3.3750 g | Freq: Three times a day (TID) | INTRAVENOUS | Status: DC
  Administered 2016-09-16 – 2016-09-18 (×7): 3.375 g via INTRAVENOUS
  Filled 2016-09-16 (×10): qty 50

## 2016-09-16 MED ORDER — ACETAMINOPHEN 325 MG PO TABS
650.0000 mg | ORAL_TABLET | Freq: Four times a day (QID) | ORAL | Status: DC | PRN
  Administered 2016-09-17 – 2016-09-19 (×2): 650 mg via ORAL
  Filled 2016-09-16 (×3): qty 2

## 2016-09-16 MED ORDER — HYDROCORTISONE NA SUCCINATE PF 100 MG IJ SOLR
50.0000 mg | Freq: Two times a day (BID) | INTRAMUSCULAR | Status: DC
  Administered 2016-09-16 – 2016-09-18 (×5): 50 mg via INTRAVENOUS
  Filled 2016-09-16 (×5): qty 2

## 2016-09-16 NOTE — Progress Notes (Signed)
[place] TEAM 1 - Stepdown/ICU TEAM  CROSBY BEVAN  AST:[id number] DOB: Dec 11, 1925 DOA: 09/14/2016 PCP: Haywood Pao, MD    Brief Narrative:  83 yo F Hx CVA, Atrial fibrillation, chronic CHF, S/P Implantable Loop Recorder, COPD, HTN, and RA who presented w/ a multiple week history of generalized weakness/fatigue with associated poor oral intake. Prior to the onset of lethargy she had been diagnosed with a respiratory illness and was given steroids as well as an antibiotic.   Subjective: The patient states she feels much better.  She is resting comfortably in bed.  She denies current shortness of breath fevers chills nausea or vomiting.  She continues to experience a mild wheeze and a productive cough.  Assessment & Plan:  Sepsis due to ?PNA LLL - LLL volume loss on CXR resp virus panel negative - blood cx negative - ?LLL PNA v/s bronhitis or even bronchiectasis - aspiration PNA or even post-obstructive PNA also on the differential - will check CT chest to evaluate further (discussed w/ pt and dgtr in detail and they agree)  LUL soft tissue density Noted on CXR this admit and present on prior CT chest Jan 2017 - repeat CT chest today   Chronic grade 2 Diastolic congestive heart failure TTE 2/13 noted EF 60-65% w/ grade 2 diastolic dysfunction - baseline weight appears to be approximately 57 kg  Filed Weights   09/14/16 1312 09/14/16 2055  Weight: 58.1 kg (128 lb 1.4 oz) 48.3 kg (106 lb 6.4 oz)    Hypotension w/ hx of Hypertension Blood pressure has stabilized with simple volume resuscitation  Peripheral arterial disease  Proximal stenosis of SMA, left common and external iliac artery stenosis noted on CTangio - discussed with Vascular Surgeon who feels that these changes are likely chronic - patient denies any postprandial issues and has strong pedal pulses bilaterally  AKI baseline 0.6 - renal function has normalized with volume resuscitation  Recent Labs Lab  09/14/16 1200 09/14/16 1521 09/15/16 0418 09/16/16 0516  CREATININE 1.10* 0.90 0.74 0.75    Hyperglycemia 2/12 A1c 5.4  COPD at baseline but history suggests possible chronic bronchitis versus bronchiectasis - CT chest to investigate  Paroxysmal A-Fib continue Eliquis - currently in NSR  Dysphagia? swallow study pending  RA well controlled - continue Remicade and methotrexate   HLD continue statin  Chronic pain continuie norco  Hypokalemia  DVT prophylaxis: eliquis Code Status: DNR / NO CODE Family Communication: Spoke with daughter at bedside at length Disposition Plan: Stable for transfer to telemetry bed  Consultants:  none  Procedures: none  Antimicrobials:  Anti-infectives    Start     Dose/Rate Route Frequency Ordered Stop   09/15/16 1430  vancomycin (VANCOCIN) 500 mg in sodium chloride 0.9 % 100 mL IVPB     500 mg 100 mL/hr over 60 Minutes Intravenous Every 24 hours 09/14/16 1347     09/14/16 2200  piperacillin-tazobactam (ZOSYN) IVPB 3.375 g     3.375 g 12.5 mL/hr over 240 Minutes Intravenous Every 8 hours 09/14/16 1557     09/14/16 1345  vancomycin (VANCOCIN) IVPB 1000 mg/200 mL premix     1,000 mg 200 mL/hr over 60 Minutes Intravenous  Once 09/14/16 1328 09/14/16 1646   09/14/16 1315  piperacillin-tazobactam (ZOSYN) IVPB 3.375 g     3.375 g 100 mL/hr over 30 Minutes Intravenous  Once 09/14/16 1314 09/14/16 1649      Objective: Blood pressure 118/62, pulse 81, temperature 97.5 F (36.4 C), temperature  source Oral, resp. rate 17, height 5' (1.524 m), weight 48.3 kg (106 lb 6.4 oz), SpO2 97 %.  Intake/Output Summary (Last 24 hours) at 09/16/16 1022 Last data filed at 09/16/16 0636  Gross per 24 hour  Intake           4027.5 ml  Output                0 ml  Net           4027.5 ml   Filed Weights   09/14/16 1312 09/14/16 2055  Weight: 58.1 kg (128 lb 1.4 oz) 48.3 kg (106 lb 6.4 oz)    Examination: General: No acute respiratory  distress - alert and conversant  Lungs: Coarse crackles throughout with poor air movement and left base and very mild expiratory wheezing Cardiovascular: Regular rate without appreciable murmur Abdomen: Nontender, nondistended, soft, bowel sounds positive, no rebound, no ascites, no appreciable mass Extremities: No significant cyanosis, clubbing, or edema bilateral lower extremities  CBC:  Recent Labs Lab 09/14/16 1136 09/14/16 1200 09/15/16 0418 09/16/16 0516  WBC 19.1*  --  12.0* 11.5*  NEUTROABS 16.4*  --   --  10.7*  HGB 11.9* 12.2 9.6* 9.4*  HCT 36.7 36.0 29.6* 29.9*  MCV 94.1  --  91.9 94.6  PLT 372  --  280 376   Basic Metabolic Panel:  Recent Labs Lab 09/14/16 1200 09/14/16 1521 09/14/16 1709 09/15/16 0418 09/15/16 1509 09/16/16 0516  NA 133* 136  --  136  --  139  K 3.7 3.3*  --  2.8* 3.7 4.6  CL 97* 104  --  106  --  110  CO2  --  20*  --  21*  --  23  GLUCOSE 232* 140*  --  121*  --  170*  BUN 46* 35*  --  27*  --  14  CREATININE 1.10* 0.90  --  0.74  --  0.75  CALCIUM  --  7.7*  --  7.2*  --  7.1*  MG  --   --  1.9  --  1.9 1.8  PHOS  --   --  3.3  --   --   --    GFR: Estimated Creatinine Clearance: 34.9 mL/min (by C-G formula based on SCr of 0.75 mg/dL).  Liver Function Tests:  Recent Labs Lab 09/14/16 1521 09/15/16 0418  AST 39 29  ALT 25 24  ALKPHOS 62 59  BILITOT 0.9 0.6  PROT 5.0* 4.9*  ALBUMIN 2.0* 1.9*    Recent Labs Lab 09/14/16 1521  LIPASE 22   Coagulation Profile:  Recent Labs Lab 09/14/16 1709  INR 1.86    HbA1C: Hgb A1c MFr Bld  Date/Time Value Ref Range Status  09/14/2016 04:21 PM 5.4 4.8 - 5.6 % Final    Comment:    (NOTE)         Pre-diabetes: 5.7 - 6.4         Diabetes: >6.4         Glycemic control for adults with diabetes: <7.0   10/24/2014 01:23 AM 5.6 4.8 - 5.6 % Final    Comment:    (NOTE)         Pre-diabetes: 5.7 - 6.4         Diabetes: >6.4         Glycemic control for adults with diabetes:  <7.0     CBG:  Recent Labs Lab 09/15/16 0746 09/15/16 1132 09/15/16 1644  09/15/16 2154 09/16/16 0730  GLUCAP 144* 195* 138* 161* 148*    Recent Results (from the past 240 hour(s))  Blood culture (routine x 2)     Status: None (Preliminary result)   Collection Time: 09/14/16  3:19 PM  Result Value Ref Range Status   Specimen Description BLOOD RIGHT ANTECUBITAL  Final   Special Requests IN PEDIATRIC BOTTLE 1CC  Final   Culture NO GROWTH < 24 HOURS  Final   Report Status PENDING  Incomplete  Blood culture (routine x 2)     Status: None (Preliminary result)   Collection Time: 09/14/16  3:26 PM  Result Value Ref Range Status   Specimen Description BLOOD RIGHT HAND  Final   Special Requests IN PEDIATRIC BOTTLE 1CC  Final   Culture NO GROWTH < 24 HOURS  Final   Report Status PENDING  Incomplete  Respiratory Panel by PCR     Status: None   Collection Time: 09/14/16  3:47 PM  Result Value Ref Range Status   Adenovirus NOT DETECTED NOT DETECTED Final   Coronavirus 229E NOT DETECTED NOT DETECTED Final   Coronavirus HKU1 NOT DETECTED NOT DETECTED Final   Coronavirus NL63 NOT DETECTED NOT DETECTED Final   Coronavirus OC43 NOT DETECTED NOT DETECTED Final   Metapneumovirus NOT DETECTED NOT DETECTED Final   Rhinovirus / Enterovirus NOT DETECTED NOT DETECTED Final   Influenza A NOT DETECTED NOT DETECTED Final   Influenza B NOT DETECTED NOT DETECTED Final   Parainfluenza Virus 1 NOT DETECTED NOT DETECTED Final   Parainfluenza Virus 2 NOT DETECTED NOT DETECTED Final   Parainfluenza Virus 3 NOT DETECTED NOT DETECTED Final   Parainfluenza Virus 4 NOT DETECTED NOT DETECTED Final   Respiratory Syncytial Virus NOT DETECTED NOT DETECTED Final   Bordetella pertussis NOT DETECTED NOT DETECTED Final   Chlamydophila pneumoniae NOT DETECTED NOT DETECTED Final   Mycoplasma pneumoniae NOT DETECTED NOT DETECTED Final  Culture, Urine     Status: Abnormal   Collection Time: 09/14/16  4:47 PM    Result Value Ref Range Status   Specimen Description URINE, RANDOM  Final   Special Requests NONE  Final   Culture MULTIPLE SPECIES PRESENT, SUGGEST RECOLLECTION (A)  Final   Report Status 09/15/2016 FINAL  Final  MRSA PCR Screening     Status: None   Collection Time: 09/14/16 10:03 PM  Result Value Ref Range Status   MRSA by PCR NEGATIVE NEGATIVE Final    Comment:        The GeneXpert MRSA Assay (FDA approved for NASAL specimens only), is one component of a comprehensive MRSA colonization surveillance program. It is not intended to diagnose MRSA infection nor to guide or monitor treatment for MRSA infections.      Scheduled Meds: . apixaban  2.5 mg Oral BID  . feeding supplement  1 Container Oral TID BM  . HYDROcodone-acetaminophen  1 tablet Oral BID  . hydrocortisone sod succinate (SOLU-CORTEF) inj  100 mg Intravenous Q6H  . insulin aspart  0-5 Units Subcutaneous QHS  . insulin aspart  0-9 Units Subcutaneous TID WC  . [START ON 09/19/2016] methotrexate  15 mg Oral Q Sat  . piperacillin-tazobactam (ZOSYN)  IV  3.375 g Intravenous Q8H  . simvastatin  10 mg Oral q1800  . sodium chloride flush  3 mL Intravenous Q12H  . vancomycin  500 mg Intravenous Q24H  . zolpidem  2.5 mg Oral Once     LOS: 2 days   Dellis Filbert T.  Thereasa Solo, MD [facility] Office  [phone] Pager - Text Page per Amion as per below:  On-Call/Text Page:      [email or web address]      password [email or web address]  If 7PM-7AM, please contact night-coverage [email or web address] Password [facility] 09/16/2016, 10:22 AM

## 2016-09-16 NOTE — Evaluation (Signed)
Physical Therapy Evaluation Patient Details Name: Debbie Kramer MRN: [id number] DOB: 11/13/1925 Today's Date: 09/16/2016   History of Present Illness  83 y.o. female with medical history significant of CVA, atrial fibrillation, CHF, COPD, hypertension, rheumatoid arthritis. Patient waning of multiple week history of generalized weakness and fatigue with associated poor oral intake due to low appetite. Dx of sepsis, ?PNA, AKI.  Clinical Impression  Pt admitted with above diagnosis. Pt currently with functional limitations due to the deficits listed below (see PT Problem List). Mod assist for supine to sit, min A to ambulate 3'. Pt has had several weeks of decline in mobility. ST-SNF recommended to address deconditioning. Of note, pt's daughter who is POA feels pt would benefit from ST-SNF, feels her sister isn't providing good care for pt at home.   Pt will benefit from skilled PT to increase their independence and safety with mobility to allow discharge to the venue listed below.       Follow Up Recommendations SNF;Supervision/Assistance - 24 hour     Equipment Recommendations  None recommended by PT    Recommendations for Other Services OT consult     Precautions / Restrictions Precautions Precautions: Fall Precaution Comments: pt/family deny falls in past 1 year Restrictions Weight Bearing Restrictions: No      Mobility  Bed Mobility Overal bed mobility: Needs Assistance Bed Mobility: Supine to Sit     Supine to sit: Min assist     General bed mobility comments: min A to raise trunk  Transfers Overall transfer level: Needs assistance Equipment used: Rolling walker (2 wheeled) Transfers: Sit to/from Stand Sit to Stand: Mod assist         General transfer comment: VCs hand placement, mod A to rise  Ambulation/Gait Ambulation/Gait assistance: Min guard Ambulation Distance (Feet): 3 Feet Assistive device: Rolling walker (2 wheeled) Gait Pattern/deviations:  Step-to pattern;Decreased step length - right;Decreased step length - left;Trunk flexed   Gait velocity interpretation: Below normal speed for age/gender General Gait Details: min/guard for safety, distance limited by fatigue, SaO2 94% on RA, 2/4 dyspnea, congested breath sounds,  HR 70s, no LOB  Stairs            Wheelchair Mobility    Modified Rankin (Stroke Patients Only)       Balance Overall balance assessment: Needs assistance Sitting-balance support: Feet supported;Bilateral upper extremity supported Sitting balance-Leahy Scale: Good     Standing balance support: Bilateral upper extremity supported Standing balance-Leahy Scale: Poor Standing balance comment: relies on BUE support                             Pertinent Vitals/Pain Pain Assessment: No/denies pain    Home Living Family/patient expects to be discharged to:: Private residence Living Arrangements: Children Available Help at Discharge: Family;Available 24 hours/day Type of Home: House Home Access: Stairs to enter Entrance Stairs-Rails: Right;Left;Can reach both Entrance Stairs-Number of Steps: 2 Home Layout: One level Home Equipment: Walker - 2 wheels;Cane - single point;Shower seat;Grab bars - toilet;Grab bars - tub/shower;Wheelchair - power      Prior Function Level of Independence: Needs assistance   Gait / Transfers Assistance Needed: previously walked with SPC, lately has walked short distances with RW  ADL's / Homemaking Assistance Needed: assist for sponge bath        Hand Dominance   Dominant Hand: Right    Extremity/Trunk Assessment   Upper Extremity Assessment Upper Extremity Assessment: Defer to OT  evaluation    Lower Extremity Assessment Lower Extremity Assessment: Overall WFL for tasks assessed    Cervical / Trunk Assessment Cervical / Trunk Assessment: Kyphotic  Communication   Communication: No difficulties  Cognition Arousal/Alertness:  Awake/alert Behavior During Therapy: WFL for tasks assessed/performed Overall Cognitive Status: Within Functional Limits for tasks assessed                      General Comments      Exercises     Assessment/Plan    PT Assessment Patient needs continued PT services  PT Problem List Decreased activity tolerance;Decreased balance;Decreased mobility          PT Treatment Interventions Gait training;Functional mobility training;Stair training;Therapeutic exercise;Therapeutic activities;Patient/family education    PT Goals (Current goals can be found in the Care Plan section)  Acute Rehab PT Goals Patient Stated Goal: to be able to walk, daughter who is POA would like ST-SNF PT Goal Formulation: With patient/family Time For Goal Achievement: 09/18/2016 Potential to Achieve Goals: Good    Frequency Min 3X/week   Barriers to discharge        Co-evaluation               End of Session Equipment Utilized During Treatment: Gait belt;Oxygen Activity Tolerance: Patient limited by fatigue;No increased pain Patient left: in chair;with call bell/phone within reach;with family/visitor present Nurse Communication: Mobility status         Time: 1309-1330 PT Time Calculation (min) (ACUTE ONLY): 21 min   Charges:   PT Evaluation $PT Eval Moderate Complexity: 1 Procedure     PT G Codes:        Philomena Doheny 09/16/2016, 1:44 PM [phone]

## 2016-09-16 NOTE — Progress Notes (Signed)
RT attempted to see patient x2 fr neb treatments, and she refused both times. She did allow me to listen to her, BBS coarse crackles. After assessment, I am changing her nebs to TID and PRN and we will continue to encourage her to take them.

## 2016-09-16 NOTE — Evaluation (Signed)
Clinical/Bedside Swallow Evaluation Patient Details  Name: Debbie Kramer MRN: [id number] Date of Birth: Dec 13, 1925  Today's Date: 09/16/2016 Time: SLP Start Time (ACUTE ONLY): 55 SLP Stop Time (ACUTE ONLY): 1120 SLP Time Calculation (min) (ACUTE ONLY): 15 min  Past Medical History:  Past Medical History:  Diagnosis Date  . Atrial fibrillation ([place])   . Bronchitis   . CHF (congestive heart failure) ([place])   . COPD (chronic obstructive pulmonary disease) ([place])   . CVA (cerebral vascular accident) ([place])   . Degenerative disc disease   . Hypertension   . Rheumatoid arthritis(714.0)    Past Surgical History:  Past Surgical History:  Procedure Laterality Date  . CHOLECYSTECTOMY     lap choley  . HERNIA REPAIR     umbilical  . KNEE SURGERY Bilateral 1996, 1997   replacements  . LOOP RECORDER IMPLANT N/A 10/25/2014   Procedure: LOOP RECORDER IMPLANT;  Surgeon: Deboraha Sprang, MD;  Location: [facility] CATH LAB;  Service: Cardiovascular;  Laterality: N/A;   HPI:  83 y.o.WF PMHxCVA, Atrial fibrillation, chronic systolic and diastolic CHF, COPD, HTN, RA.  Admitted with multiple week hx weakness, fatigue, poor oral intake.  Hx MBS 08/06/15 - mild dysphagia intermittent silent, deep penetration. Regular diet, thin liquids were recommended at that time.     Assessment / Plan / Recommendation Clinical Impression  Pt presents with a functional oropharyngeal swallow with adequate mastication, brisk swallow response, no overt s/s of aspiration. RR compatible with adequate swallow/respiratory reciprocity.  Recommend continuing current diet.  No further SLP f/u warranted.  Dtr/pt educated.     Aspiration Risk       Diet Recommendation   regular, thin liquids  Medication Administration: Whole meds with liquid (give with puree if coughing)    Other  Recommendations Oral Care Recommendations: Oral care BID   Follow up Recommendations    no    Frequency and Duration            Prognosis         Swallow Study   General Date of Onset: 09/14/16 HPI: 83 y.o.WF PMHxCVA, Atrial fibrillation, chronic systolic and diastolic CHF, COPD, HTN, RA.  Admitted with multiple week hx weakness, fatigue, poor oral intake.  Hx MBS 08/06/15 - mild dysphagia intermittent silent, deep penetration. Regular diet, thin liquids were recommended at that time.   Type of Study: Bedside Swallow Evaluation Previous Swallow Assessment: see HPI Diet Prior to this Study: Regular;Thin liquids Temperature Spikes Noted: No Respiratory Status: Nasal cannula History of Recent Intubation: No Behavior/Cognition: Alert;Cooperative;Pleasant mood Oral Cavity Assessment: Within Functional Limits Oral Care Completed by SLP: No Oral Cavity - Dentition: Dentures, top;Dentures, bottom Vision: Functional for self-feeding Self-Feeding Abilities: Able to feed self Patient Positioning: Upright in bed Baseline Vocal Quality: Wet Volitional Cough: Strong Volitional Swallow: Able to elicit    Oral/Motor/Sensory Function Overall Oral Motor/Sensory Function: Within functional limits   Ice Chips Ice chips: Within functional limits   Thin Liquid Thin Liquid: Within functional limits Presentation: Cup;Straw    Nectar Thick Nectar Thick Liquid: Not tested   Honey Thick Honey Thick Liquid: Not tested   Puree Puree: Within functional limits Presentation: Self Fed;Spoon   Solid   GO   Solid: Within functional limits        Juan Quam Laurice 09/16/2016,11:32 AM

## 2016-09-17 DIAGNOSIS — J9859 Other diseases of mediastinum, not elsewhere classified: Secondary | ICD-10-CM | POA: Diagnosis present

## 2016-09-17 DIAGNOSIS — D381 Neoplasm of uncertain behavior of trachea, bronchus and lung: Secondary | ICD-10-CM | POA: Diagnosis present

## 2016-09-17 DIAGNOSIS — J189 Pneumonia, unspecified organism: Secondary | ICD-10-CM | POA: Diagnosis present

## 2016-09-17 LAB — CBC WITH DIFFERENTIAL/PLATELET
Basophils Absolute: 0 10*3/uL (ref 0.0–0.1)
Basophils Relative: 0 %
EOS PCT: 0 %
Eosinophils Absolute: 0 10*3/uL (ref 0.0–0.7)
HEMATOCRIT: 30 % — AB (ref 36.0–46.0)
HEMOGLOBIN: 9.5 g/dL — AB (ref 12.0–15.0)
LYMPHS ABS: 0.8 10*3/uL (ref 0.7–4.0)
LYMPHS PCT: 8 %
MCH: 30.3 pg (ref 26.0–34.0)
MCHC: 31.7 g/dL (ref 30.0–36.0)
MCV: 95.5 fL (ref 78.0–100.0)
Monocytes Absolute: 0.4 10*3/uL (ref 0.1–1.0)
Monocytes Relative: 4 %
NEUTROS PCT: 88 %
Neutro Abs: 8.7 10*3/uL — ABNORMAL HIGH (ref 1.7–7.7)
PLATELETS: 279 10*3/uL (ref 150–400)
RBC: 3.14 MIL/uL — AB (ref 3.87–5.11)
RDW: 16.3 % — ABNORMAL HIGH (ref 11.5–15.5)
WBC: 9.9 10*3/uL (ref 4.0–10.5)

## 2016-09-17 LAB — BASIC METABOLIC PANEL
ANION GAP: 4 — AB (ref 5–15)
BUN: 10 mg/dL (ref 6–20)
CHLORIDE: 112 mmol/L — AB (ref 101–111)
CO2: 23 mmol/L (ref 22–32)
Calcium: 7.1 mg/dL — ABNORMAL LOW (ref 8.9–10.3)
Creatinine, Ser: 0.66 mg/dL (ref 0.44–1.00)
GFR calc non Af Amer: >60 mL/min (ref >60)
Glucose, Bld: 138 mg/dL — ABNORMAL HIGH (ref 65–99)
POTASSIUM: 3.2 mmol/L — AB (ref 3.5–5.1)
SODIUM: 139 mmol/L (ref 135–145)

## 2016-09-17 LAB — GLUCOSE, CAPILLARY
GLUCOSE-CAPILLARY: 217 mg/dL — AB (ref 65–99)
Glucose-Capillary: 124 mg/dL — ABNORMAL HIGH (ref 65–99)
Glucose-Capillary: 176 mg/dL — ABNORMAL HIGH (ref 65–99)
Glucose-Capillary: 223 mg/dL — ABNORMAL HIGH (ref 65–99)

## 2016-09-17 LAB — MAGNESIUM: MAGNESIUM: 1.9 mg/dL (ref 1.7–2.4)

## 2016-09-17 MED ORDER — DM-GUAIFENESIN ER 30-600 MG PO TB12
1.0000 | ORAL_TABLET | Freq: Two times a day (BID) | ORAL | Status: DC
  Administered 2016-09-17 – 2016-09-18 (×2): 1 via ORAL
  Filled 2016-09-17 (×2): qty 1

## 2016-09-17 MED ORDER — POTASSIUM CHLORIDE CRYS ER 20 MEQ PO TBCR
60.0000 meq | EXTENDED_RELEASE_TABLET | Freq: Once | ORAL | Status: AC
  Administered 2016-09-17: 60 meq via ORAL
  Filled 2016-09-17: qty 3

## 2016-09-17 NOTE — Evaluation (Signed)
Occupational Therapy Evaluation Patient Details Name: Debbie Kramer MRN: [id number] DOB: [birth year] Today's Date: 09/17/2016    History of Present Illness 83 y.o. female with medical history significant of CVA, atrial fibrillation, CHF, COPD, hypertension, rheumatoid arthritis. Patient waning of multiple week history of generalized weakness and fatigue with associated poor oral intake due to low appetite. Dx of sepsis, ?PNA, AKI.   Clinical Impression   Pt reports she required assist for sponge bathing but was able to complete dressing and toilet transfers at mod I level PTA. Currently pt overall min guard for seated ADL and stand pivot transfers. Pt presenting with deconditioning, generalized weakness, and appears to be anxious regarding activity which impacts her independence and safety with ADL and functional mobility. Educated pt on importance of OOB activity and sitting up in chair due to current respiratory status; pt verbalized understanding. Recommending SNF for follow up to maximize independence and safety with ADL and functional mobility prior to return home. Pt would benefit from continued skilled OT to address established goals.    Follow Up Recommendations  SNF;Supervision/Assistance - 24 hour    Equipment Recommendations  Tub/shower seat    Recommendations for Other Services       Precautions / Restrictions Precautions Precautions: Fall Restrictions Weight Bearing Restrictions: No      Mobility Bed Mobility Overal bed mobility: Needs Assistance Bed Mobility: Supine to Sit     Supine to sit: Min guard;HOB elevated     General bed mobility comments: Min guard for safety especially with trunk elevation to sitting. Pt able to scoot hips out to EOB. Increased time required, HOB elevated with use of bed rails  Transfers Overall transfer level: Needs assistance Equipment used: Rolling walker (2 wheeled) Transfers: Sit to/from [facility] Sit to  Stand: Min guard Stand pivot transfers: Min guard       General transfer comment: Cues for hand placement and increased time to perform.    Balance Overall balance assessment: Needs assistance Sitting-balance support: Feet unsupported;Bilateral upper extremity supported Sitting balance-Leahy Scale: Good     Standing balance support: Bilateral upper extremity supported Standing balance-Leahy Scale: Poor Standing balance comment: RW for support                            ADL Overall ADL's : Needs assistance/impaired Eating/Feeding: Set up;Sitting   Grooming: Min guard;Sitting   Upper Body Bathing: Min guard;Sitting   Lower Body Bathing: Minimal assistance;Sit to/from stand   Upper Body Dressing : Min guard;Sitting   Lower Body Dressing: Min guard;Sit to/from stand Lower Body Dressing Details (indicate cue type and reason): Pt able to don socks sitting EOB with min guard assist Toilet Transfer: Min Geophysical data processor Details (indicate cue type and reason): Simulated by stand pivot from EOB to chair         Functional mobility during ADLs: Min guard;Rolling walker General ADL Comments: Pt appears anxious throughout activity. Understands importance of OOB activity and sitting upright for respiratory complications but needs much encouragement to participate in activities. VSS throughout; kept pt on supplemental O2.     Vision     Perception     Praxis      Pertinent Vitals/Pain Pain Assessment: 0-10 Pain Score: 5  Pain Location: all over Pain Descriptors / Indicators: Sore Pain Intervention(s): Monitored during session;Repositioned     Hand Dominance Right   Extremity/Trunk Assessment Upper Extremity Assessment Upper Extremity Assessment: Generalized weakness  Lower Extremity Assessment Lower Extremity Assessment: Defer to PT evaluation   Cervical / Trunk Assessment Cervical / Trunk Assessment: Kyphotic   Communication  Communication Communication: No difficulties   Cognition Arousal/Alertness: Awake/alert Behavior During Therapy: WFL for tasks assessed/performed;Anxious Overall Cognitive Status: Within Functional Limits for tasks assessed                     General Comments       Exercises       Shoulder Instructions      Home Living Family/patient expects to be discharged to:: Private residence Living Arrangements: Children Available Help at Discharge: Family;Available 24 hours/day Type of Home: House Home Access: Stairs to enter [employer] of Steps: 2 Entrance Stairs-Rails: Right;Left;Can reach both Home Layout: One level     Bathroom Shower/Tub: Occupational psychologist: Handicapped height Bathroom Accessibility: Yes How Accessible: Accessible via walker Home Equipment: [place] - 2 wheels;Cane - single point;Shower seat;Grab bars - toilet;Grab bars - tub/shower;Wheelchair - power          Prior Functioning/Environment Level of Independence: Needs assistance  Gait / Transfers Assistance Needed: previously walked with SPC, lately has walked short distances with RW ADL's / Homemaking Assistance Needed: assist for sponge bath. dresses independently            OT Problem List: Decreased strength;Decreased activity tolerance;Impaired balance (sitting and/or standing);Decreased knowledge of use of DME or AE;Cardiopulmonary status limiting activity;Pain   OT Treatment/Interventions: Self-care/ADL training;Therapeutic exercise;Energy conservation;DME and/or AE instruction;Therapeutic activities;Patient/family education;Balance training    OT Goals(Current goals can be found in the care plan section) Acute Rehab OT Goals Patient Stated Goal: to get back home after rehab OT Goal Formulation: With patient Time For Goal Achievement: October 03, 2016 Potential to Achieve Goals: Good ADL Goals Pt Will Perform Grooming: with set-up;sitting Pt Will Transfer to Toilet:  with supervision;ambulating;bedside commode (over toilet) Pt Will Perform Toileting - Clothing Manipulation and hygiene: with supervision;sit to/from stand Additional ADL Goal #1: Pt will independently verbally recall 3 energy conservation strategies in utilize during ADL.  OT Frequency: Min 2X/week   Barriers to D/C:            Co-evaluation              End of Session Equipment Utilized During Treatment: Gait belt;Rolling walker;Oxygen Nurse Communication: Mobility status  Activity Tolerance: Patient tolerated treatment well Patient left: in chair;with call bell/phone within reach;with chair alarm set   Time: 2119-4174 OT Time Calculation (min): 24 min Charges:  OT General Charges $OT Visit: 1 Procedure OT Evaluation $OT Eval Moderate Complexity: 1 Procedure OT Treatments $Self Care/Home Management : 8-22 mins G-Codes:     Binnie Kand M.S., OTR/L Pager: [phone]  09/17/2016, 10:22 AM

## 2016-09-17 NOTE — Progress Notes (Signed)
PROGRESS NOTE    Debbie Kramer  ZOX:[id number] DOB: 1926/01/02 DOA: 09/14/2016 PCP: Haywood Pao, MD   Brief Narrative:  83 y.o.WF PMHx CVA, Atrial fibrillation, chronic systolic and diastolic CHF (Insertable Cardiac Monitor/Implantable Loop Recorder present), COPD, HTN, RA,   Patient waning of multiple week history of generalized weakness and fatigue with associated poor oral intake due to low cardiac monitor/ appetite. Patient reports prior to onset of symptoms that she been diagnosed with a respiratory illness and was given steroids as well as an antibiotic. She's been off those medicines now for approximately 2 weeks. Patient's only focal complaint is a intermittent productive cough for the last 2 days but denies any shortness of breath, chest pain, palpitations, dysuria, frequency, back pain, focal neurological deficit, neck stiffness, headache. Patient does have some mild abdominal discomfort from time to time which is easily relieved with passing her bowels.   Subjective: 2/15 A/O 4. NAD. Positive SOB, positive CP.Marland Kitchen   Assessment & Plan:   Active Problems:   Rheumatoid arthritis ([place])   Essential hypertension   CAP (community acquired pneumonia)   Paroxysmal atrial fibrillation (H[facility])   Sepsis (HCC)   Hypotension   SMA stenosis (HCC)   AKI (acute kidney injury) ([place])   Hyperglycemia   Chronic combined systolic and diastolic CHF (congestive heart failure) (HCC)  Lungs neoplasm -Patient CT chest consistent with lung cancer. Given patient's age and frailty patient and family elected no further workup. Request power acute  -Sepsis unspecified organism:/ Postobstructive pneumonia -Hypotensive, hypothermic, WBC 19.1 with left shift, lactic acid 8.31,  - CT angio of the abdomen and pelvis with central arterial disease as outlined below. Not likely to be contributing at this time after discussing the case with Dr. Tawni Millers of vascular surgery. Doubt bowel ischemia.    -Postobstructive pneumonia secondary to neoplasm  -Flutter valve -Continue current antibiotics -Mucinex DM BID -DuoNeb TID  Chronic Diastolic congestive heart failure:  -Last echo showing an EF of 40% and grade 1 diastolic dysfunction. No evidence of acute conversation at this time. Monitor closely given aggressive fluid administration due to hypotension. - Strict I's and O's, since admission + 8.8 L - daily weights Filed Weights   09/14/16 1312 09/14/16 2055 09/17/16 0418  Weight: 58.1 kg (128 lb 1.4 oz) 48.3 kg (106 lb 6.4 oz) 52.1 kg (114 lb 12.8 oz)  - Hold Lasix due to hypotension and sepsis - Echo: See results below   Hypotension:  -hypotensive on admission: Continues to be soft - Hold BP medication  Arterial stenosis:  -Proximal stenosis of SMA, left common and external iliac artery stenosis.  -Per chart findings discussed with on-call vascular surgeon, Dr. Donnetta Hutching and feels that these changes are likely chronic and noncooperative to current symptoms.  -Patient denies any postprandial issues. Patient with strong pedal pulses bilaterally. Patient does not need emergent evaluation. - outpatient follow-up if becomes symptomatic. .  AKI:( Baseline 0.6) Lab Results  Component Value Date   CREATININE 0.66 09/17/2016   CREATININE 0.75 09/16/2016   CREATININE 0.74 09/15/2016   Hyperglycemia:  -2/12 Hemoglobin A1c= 5.4 -232 on admission.  Recent stress and steroids (2 wks ago), and infection not likely to elevate glucose above 200.  - SSI (sensitive)  COPD:   Paroxysmal A-Fib:  - continue Eliquis, - Hold Metoprolol  -Currently in NSR   Protein calorie malnutrition:  - continue ensure  Dysphagia -Swallow study: Regular diet fluid consistency thin  RA:  -well controlled - Continue Remicade outpt -  COntinue methotrexate (due Saturday)  HLD: - continue statin  Chronic pain: - continuie norco  Hypokalemia -Potassium goal> 4  -K-Dur 60 mEq    DVT  prophylaxis: Eliquis Code Status: DO NOT RESUSCITATE Family Communication: Spoke at length with Trey Sailors and patient they wish to speak with palliative care. Disposition Plan: Await Palliative CARE recommendation    Consultants:  None  Procedures/Significant Events:  10/13 Echocardiogram;Left ventricle: moderate focal basal and mild concentric hypertrophy.  -LVEF = 60% to 65%. - (grade 2 diastolic dysfunction).     VENTILATOR SETTINGS:    Cultures 2/12 respiratory virus panel negative 2/12 blood NGTD 2/12 urine positive multiple species       Antimicrobials: Anti-infectives    Start     Stop   09/16/16 1400  piperacillin-tazobactam (ZOSYN) IVPB 3.375 g         09/15/16 1430  vancomycin (VANCOCIN) 500 mg in sodium chloride 0.9 % 100 mL IVPB  Status:  Discontinued     09/16/16 1111   09/14/16 2200  piperacillin-tazobactam (ZOSYN) IVPB 3.375 g  Status:  Discontinued     09/16/16 1212   09/14/16 1345  vancomycin (VANCOCIN) IVPB 1000 mg/200 mL premix     09/14/16 1646   09/14/16 1315  piperacillin-tazobactam (ZOSYN) IVPB 3.375 g     09/14/16 1649       Devices   LINES / TUBES:      Continuous Infusions:    Objective: Vitals:   09/16/16 1700 09/16/16 1942 09/16/16 1958 09/17/16 0418  BP:  114/70  117/72  Pulse:  77  83  Resp:  (!) 24  19  Temp: 97.7 F (36.5 C) 98.3 F (36.8 C)  97.9 F (36.6 C)  TempSrc: Oral Oral  Oral  SpO2:  100% 100% 98%  Weight:    52.1 kg (114 lb 12.8 oz)  Height:        Intake/Output Summary (Last 24 hours) at 09/17/16 1505 Last data filed at 09/17/16 0502  Gross per 24 hour  Intake              150 ml  Output                0 ml  Net              150 ml   Filed Weights   09/14/16 1312 09/14/16 2055 09/17/16 0418  Weight: 58.1 kg (128 lb 1.4 oz) 48.3 kg (106 lb 6.4 oz) 52.1 kg (114 lb 12.8 oz)    Examination:  General: A/O 4, NAD, No acute respiratory distress Eyes: negative scleral hemorrhage, negative  anisocoria, negative icterus ENT: Negative Runny nose, negative gingival bleeding, Neck:  Negative scars, masses, torticollis, lymphadenopathy, JVD Lungs: upper airway rhonchi,Absent breath sounds left lung fields without wheezes or crackles Cardiovascular: Regular rate and rhythm without murmur gallop or rub normal S1 and S2 Abdomen: negative abdominal pain, nondistended, positive soft, bowel sounds, no rebound, no ascites, no appreciable mass Extremities: No significant cyanosis, clubbing, or edema bilateral lower extremities Skin: Negative rashes, lesions, ulcers Psychiatric:  Negative depression, negative anxiety, negative fatigue, negative mania  Central nervous system:  Cranial nerves II through XII intact, tongue/uvula midline, all extremities muscle strength 5/5, sensation intact throughout, , negative dysarthria, negative expressive aphasia, negative receptive aphasia.  .     Data Reviewed: Care during the described time interval was provided by me .  I have reviewed this patient's available data, including medical history, events of note, physical  examination, and all test results as part of my evaluation. I have personally reviewed and interpreted all radiology studies.  CBC:  Recent Labs Lab 09/14/16 1136 09/14/16 1200 09/15/16 0418 09/16/16 0516 09/17/16 0345  WBC 19.1*  --  12.0* 11.5* 9.9  NEUTROABS 16.4*  --   --  10.7* 8.7*  HGB 11.9* 12.2 9.6* 9.4* 9.5*  HCT 36.7 36.0 29.6* 29.9* 30.0*  MCV 94.1  --  91.9 94.6 95.5  PLT 372  --  280 297 258   Basic Metabolic Panel:  Recent Labs Lab 09/14/16 1200 09/14/16 1521 09/14/16 1709 09/15/16 0418 09/15/16 1509 09/16/16 0516 09/17/16 0345  NA 133* 136  --  136  --  139 139  K 3.7 3.3*  --  2.8* 3.7 4.6 3.2*  CL 97* 104  --  106  --  110 112*  CO2  --  20*  --  21*  --  23 23  GLUCOSE 232* 140*  --  121*  --  170* 138*  BUN 46* 35*  --  27*  --  14 10  CREATININE 1.10* 0.90  --  0.74  --  0.75 0.66  CALCIUM   --  7.7*  --  7.2*  --  7.1* 7.1*  MG  --   --  1.9  --  1.9 1.8 1.9  PHOS  --   --  3.3  --   --   --   --    GFR: Estimated Creatinine Clearance: 34.9 mL/min (by C-G formula based on SCr of 0.66 mg/dL). Liver Function Tests:  Recent Labs Lab 09/14/16 1521 09/15/16 0418  AST 39 29  ALT 25 24  ALKPHOS 62 59  BILITOT 0.9 0.6  PROT 5.0* 4.9*  ALBUMIN 2.0* 1.9*    Recent Labs Lab 09/14/16 1521  LIPASE 22   No results for input(s): AMMONIA in the last 168 hours. Coagulation Profile:  Recent Labs Lab 09/14/16 1709  INR 1.86   Cardiac Enzymes: No results for input(s): CKTOTAL, CKMB, CKMBINDEX, TROPONINI in the last 168 hours. BNP (last 3 results) No results for input(s): PROBNP in the last 8760 hours. HbA1C:  Recent Labs  09/14/16 1621  HGBA1C 5.4   CBG:  Recent Labs Lab 09/16/16 0730 09/16/16 1114 09/16/16 1632 09/16/16 2203 09/17/16 0723  GLUCAP 148* 140* 139* 139* 124*   Lipid Profile: No results for input(s): CHOL, HDL, LDLCALC, TRIG, CHOLHDL, LDLDIRECT in the last 72 hours. Thyroid Function Tests: No results for input(s): TSH, T4TOTAL, FREET4, T3FREE, THYROIDAB in the last 72 hours. Anemia Panel: No results for input(s): VITAMINB12, FOLATE, FERRITIN, TIBC, IRON, RETICCTPCT in the last 72 hours. Urine analysis:    Component Value Date/Time   COLORURINE YELLOW 09/14/2016 1647   APPEARANCEUR CLOUDY (A) 09/14/2016 1647   LABSPEC 1.025 09/14/2016 1647   PHURINE 5.0 09/14/2016 1647   GLUCOSEU NEGATIVE 09/14/2016 1647   HGBUR LARGE (A) 09/14/2016 1647   BILIRUBINUR NEGATIVE 09/14/2016 1647   KETONESUR NEGATIVE 09/14/2016 1647   PROTEINUR NEGATIVE 09/14/2016 1647   UROBILINOGEN 0.2 10/23/2014 1451   NITRITE NEGATIVE 09/14/2016 1647   LEUKOCYTESUR SMALL (A) 09/14/2016 1647   Sepsis Labs: '@LABRCNTIP'$ (procalcitonin:4,lacticidven:4)  ) Recent Results (from the past 240 hour(s))  Blood culture (routine x 2)     Status: None (Preliminary result)    Collection Time: 09/14/16  3:19 PM  Result Value Ref Range Status   Specimen Description BLOOD RIGHT ANTECUBITAL  Final   Special Requests IN PEDIATRIC BOTTLE [place]  Final   Culture NO GROWTH 2 DAYS  Final   Report Status PENDING  Incomplete  Blood culture (routine x 2)     Status: None (Preliminary result)   Collection Time: 09/14/16  3:26 PM  Result Value Ref Range Status   Specimen Description BLOOD RIGHT HAND  Final   Special Requests IN PEDIATRIC BOTTLE 1CC  Final   Culture NO GROWTH 2 DAYS  Final   Report Status PENDING  Incomplete  Respiratory Panel by PCR     Status: None   Collection Time: 09/14/16  3:47 PM  Result Value Ref Range Status   Adenovirus NOT DETECTED NOT DETECTED Final   Coronavirus 229E NOT DETECTED NOT DETECTED Final   Coronavirus HKU1 NOT DETECTED NOT DETECTED Final   Coronavirus NL63 NOT DETECTED NOT DETECTED Final   Coronavirus OC43 NOT DETECTED NOT DETECTED Final   Metapneumovirus NOT DETECTED NOT DETECTED Final   Rhinovirus / Enterovirus NOT DETECTED NOT DETECTED Final   Influenza A NOT DETECTED NOT DETECTED Final   Influenza B NOT DETECTED NOT DETECTED Final   Parainfluenza Virus 1 NOT DETECTED NOT DETECTED Final   Parainfluenza Virus 2 NOT DETECTED NOT DETECTED Final   Parainfluenza Virus 3 NOT DETECTED NOT DETECTED Final   Parainfluenza Virus 4 NOT DETECTED NOT DETECTED Final   Respiratory Syncytial Virus NOT DETECTED NOT DETECTED Final   Bordetella pertussis NOT DETECTED NOT DETECTED Final   Chlamydophila pneumoniae NOT DETECTED NOT DETECTED Final   Mycoplasma pneumoniae NOT DETECTED NOT DETECTED Final  Culture, Urine     Status: Abnormal   Collection Time: 09/14/16  4:47 PM  Result Value Ref Range Status   Specimen Description URINE, RANDOM  Final   Special Requests NONE  Final   Culture MULTIPLE SPECIES PRESENT, SUGGEST RECOLLECTION (A)  Final   Report Status 09/15/2016 FINAL  Final  MRSA PCR Screening     Status: None   Collection Time:  09/14/16 10:03 PM  Result Value Ref Range Status   MRSA by PCR NEGATIVE NEGATIVE Final    Comment:        The GeneXpert MRSA Assay (FDA approved for NASAL specimens only), is one component of a comprehensive MRSA colonization surveillance program. It is not intended to diagnose MRSA infection nor to guide or monitor treatment for MRSA infections.          Radiology Studies: Ct Chest Wo Contrast  Result Date: 09/16/2016 CLINICAL DATA:  83 year old female with history of mediastinal mass and atelectasis the left lung on recent chest radiograph. EXAM: CT CHEST WITHOUT CONTRAST TECHNIQUE: Multidetector CT imaging of the chest was performed following the standard protocol without IV contrast. COMPARISON:  Chest CT 08/04/2015. FINDINGS: Cardiovascular: Heart size is normal. There is no significant pericardial fluid, thickening or pericardial calcification. There is aortic atherosclerosis, as well as atherosclerosis of the great vessels of the mediastinum and the coronary arteries, including calcified atherosclerotic plaque in the left main, left anterior descending, left circumflex and right coronary arteries. Calcifications of the aortic valve. Severe calcifications of the mitral annulus. Dilatation of the pulmonic trunk (3.7 cm), concerning for pulmonary arterial hypertension. Mediastinum/Nodes: There is abnormal soft tissue in the upper left hemithorax, some of which appears to involve the superior mediastinum to the left of the aortic arch and proximal great vessels. This soft tissue comes in contact with the left innominate vein which could be narrowed or occluded by this soft tissue. This is poorly evaluated on today's noncontrast CT, but the  bulkiest portion of this soft tissue which is contiguous with adjacent pleural nodularity measures approximately 7.0 x 2.3 cm (axial image 21 of series 201). Prominent soft tissue is also noted in the left suprahilar region, likely some underlying  lymphadenopathy (poorly evaluated on today's noncontrast CT examination). There is also abnormal nodal tissue in the left hilar and subcarinal regions where this soft tissue measures up to 15 mm in thickness. Esophagus is unremarkable in appearance. No axillary lymphadenopathy. Lungs/Pleura: As mentioned above there is some abnormal soft tissue involving the pleura throughout the left hemithorax, the largest portion of which is in the apical region measuring up to 7.0 x 2.3 cm (image 21 of series 201). Several other nodular regions are noted throughout the left hemithorax. Small bilateral pleural effusions. The pleural effusion in the left hemithorax appears to be partially loculated, with several pockets in the left major fissure, while the right pleural effusion is simple in appearance lying dependently. 6 mm right lower lobe pulmonary nodule (image 67 of series 205). Several areas of passive subsegmental atelectasis are noted in the lungs bilaterally (left greater than right). Upper Abdomen: Calcifications in both renal hila, strongly favored to be vascular. Extensive aortic atherosclerosis. Status post cholecystectomy. Small volume of perihepatic ascites. Musculoskeletal: Extensive edema throughout the soft tissues of the left breast and visualized portions of the left upper extremity and axillary region, suggesting proximal venous obstruction. Irregular appearing sclerotic lesion in the left side of the manubrium measuring 1.6 x 2.6 x 2.2 cm (axial image 22 of series 201), concerning for metastatic lesion. IMPRESSION: 1. Findings are highly concerning for malignancy in the thorax, with what appears to be a pleural-based mass in the upper left hemithorax, probable left malignant pleural effusion, and left hilar and subcarinal lymphadenopathy, as detailed above. 2. The mass in the upper left hemithorax appears to invade the superior mediastinum and likely causes severe narrowing or occlusion of the left  innominate vein resulting in left breast, axillary and left upper extremity edema. 3. 6 mm right lower lobe pulmonary nodules nonspecific. This could be a metastatic lesion or a benign nodule. Attention on followup studies is recommended. 4. Aortic atherosclerosis, in addition to left main and 3 vessel coronary artery disease. 5. There are calcifications of the aortic valve and mitral annulus. Echocardiographic correlation for evaluation of potential valvular dysfunction may be warranted if clinically indicated. 6. Small volume of ascites. Electronically Signed   By: Vinnie Langton M.D.   On: 09/16/2016 15:48        Scheduled Meds: . apixaban  2.5 mg Oral BID  . feeding supplement  1 Container Oral TID BM  . folic acid  1 mg Oral Daily  . HYDROcodone-acetaminophen  1 tablet Oral BID  . hydrocortisone sod succinate (SOLU-CORTEF) inj  50 mg Intravenous Q12H  . insulin aspart  0-5 Units Subcutaneous QHS  . insulin aspart  0-9 Units Subcutaneous TID WC  . ipratropium-albuterol  3 mL Nebulization TID  . [START ON 09/19/2016] methotrexate  15 mg Oral Q Sat  . piperacillin-tazobactam (ZOSYN)  IV  3.375 g Intravenous Q8H  . simvastatin  10 mg Oral q1800  . sodium chloride flush  3 mL Intravenous Q12H   Continuous Infusions:    LOS: 3 days    Time spent: 40 minutes    WOODS, Geraldo Docker, MD [facility] Pager [phone]   If 7PM-7AM, please contact night-coverage [email or web address] Password [facility] 09/17/2016, 8:32 AM

## 2016-09-18 DIAGNOSIS — D381 Neoplasm of uncertain behavior of trachea, bronchus and lung: Secondary | ICD-10-CM

## 2016-09-18 DIAGNOSIS — Z515 Encounter for palliative care: Secondary | ICD-10-CM

## 2016-09-18 DIAGNOSIS — Z7189 Other specified counseling: Secondary | ICD-10-CM

## 2016-09-18 DIAGNOSIS — M05712 Rheumatoid arthritis with rheumatoid factor of left shoulder without organ or systems involvement: Secondary | ICD-10-CM

## 2016-09-18 DIAGNOSIS — J9859 Other diseases of mediastinum, not elsewhere classified: Secondary | ICD-10-CM

## 2016-09-18 LAB — GLUCOSE, CAPILLARY
Glucose-Capillary: 101 mg/dL — ABNORMAL HIGH (ref 65–99)
Glucose-Capillary: 134 mg/dL — ABNORMAL HIGH (ref 65–99)
Glucose-Capillary: 127 mg/dL — ABNORMAL HIGH (ref 65–99)
Glucose-Capillary: 173 mg/dL — ABNORMAL HIGH (ref 65–99)

## 2016-09-18 LAB — MAGNESIUM: MAGNESIUM: 1.8 mg/dL (ref 1.7–2.4)

## 2016-09-18 MED ORDER — ALBUTEROL SULFATE (2.5 MG/3ML) 0.083% IN NEBU: 2.5000 mg | INHALATION_SOLUTION | RESPIRATORY_TRACT | Status: DC | PRN

## 2016-09-18 MED ORDER — BOOST / RESOURCE BREEZE PO LIQD: 1.0000 | Freq: Three times a day (TID) | ORAL | Status: DC | PRN

## 2016-09-18 MED ORDER — DM-GUAIFENESIN ER 30-600 MG PO TB12: 1.0000 | ORAL_TABLET | Freq: Two times a day (BID) | ORAL | Status: DC | PRN

## 2016-09-18 NOTE — Progress Notes (Signed)
[place] TEAM 1 - Stepdown/ICU TEAM  Debbie Kramer  WUJ:[id number] DOB: 1925-08-19 DOA: 09/14/2016 PCP: Haywood Pao, MD    Brief Narrative:  83 yo F Hx CVA, Atrial fibrillation, chronic CHF, S/P Implantable Loop Recorder, COPD, HTN, and RA who presented w/ a multiple week history of generalized weakness/fatigue with associated poor oral intake. Prior to the onset of lethargy she had been diagnosed with a respiratory illness and was given steroids as well as an antibiotic.   Subjective: The patient is resting comfortably in her room at the time of visit.  She denies discomfort or significant respiratory distress at this time.  The family is meeting with palliative care this afternoon with the goal of discharge to a skilled nursing facility with comfort focused care.  Assessment & Plan:  Sepsis due to ?PNA LLL - LLL volume loss on CXR resp virus panel negative - blood cx negative - CT chest notes pleural-based mass in the upper L hemithorax, a probable malignant L pleuarl effusion, and L hilar and subcarinal LAD - the L hemithorax mass invades the superior mediastinum and is severely narrowing the L innominate vein - there is also a 60m RLL pulm nodule - a LUL soft tissue density was noted on prior CT chest Jan 2017 - the patient and her family have opted to not pursue further workup and to focus instead on quality of life and comfort  Chronic grade 2 Diastolic congestive heart failure TTE 2/13 noted EF 60-65% w/ grade 2 diastolic dysfunction - baseline weight appears to be approximately 57 kg - no evidence of volume overload at this time   [facility]   09/14/16 2055 09/17/16 0418 09/18/16 0541  Weight: 48.3 kg (106 lb 6.4 oz) 52.1 kg (114 lb 12.8 oz) 51.6 kg (113 lb 11.2 oz)    Hypotension w/ hx of Hypertension BP stabilized with simple volume resuscitation  Peripheral arterial disease  Proximal stenosis of SMA, left common and external iliac artery stenosis noted on  CTangio - Dr. WSherral Hammersdiscussed with Vascular Surgeon who feels that these changes are likely chronic - patient denied any postprandial issues and has strong pedal pulses bilaterally  AKI baseline 0.6 - renal function normalized with volume resuscitation  Recent Labs Lab 09/14/16 1200 09/14/16 1521 09/15/16 0418 09/16/16 0516 09/17/16 0345  CREATININE 1.10* 0.90 0.74 0.75 0.66    Hyperglycemia 2/12 A1c 5.4 - CBGs well controlled - avoid further monitoring   COPD No acute exacerbation at this time   Paroxysmal A-Fib continue Eliquis - currently in NSR  Dysphagia? Cleared for regular diet by SLP   RA well controlled - continue Remicade and methotrexate   HLD continue statin  Chronic pain continuie norco  Hypokalemia Mild - no further forced replacement   DVT prophylaxis: eliquis Code Status: DNR / NO CODE Family Communication: No family present at time of exam today Disposition Plan: plan for d/c to SNF in AM for comfort focused care   Consultants:  none  Procedures: none  Antimicrobials:  Anti-infectives    Start     Dose/Rate Route Frequency Ordered Stop   09/16/16 1400  piperacillin-tazobactam (ZOSYN) IVPB 3.375 g     3.375 g 12.5 mL/hr over 240 Minutes Intravenous Every 8 hours 09/16/16 1345     09/15/16 1430  vancomycin (VANCOCIN) 500 mg in sodium chloride 0.9 % 100 mL IVPB  Status:  Discontinued     500 mg 100 mL/hr over 60 Minutes Intravenous Every 24 hours 09/14/16  1347 09/16/16 1111   09/14/16 2200  piperacillin-tazobactam (ZOSYN) IVPB 3.375 g  Status:  Discontinued     3.375 g 12.5 mL/hr over 240 Minutes Intravenous Every 8 hours 09/14/16 1557 09/16/16 1212   09/14/16 1345  vancomycin (VANCOCIN) IVPB 1000 mg/200 mL premix     1,000 mg 200 mL/hr over 60 Minutes Intravenous  Once 09/14/16 1328 09/14/16 1646   09/14/16 1315  piperacillin-tazobactam (ZOSYN) IVPB 3.375 g     3.375 g 100 mL/hr over 30 Minutes Intravenous  Once 09/14/16 1314  09/14/16 1649      Objective: Blood pressure (!) 148/81, pulse 76, temperature 97.6 F (36.4 C), temperature source Oral, resp. rate (!) 23, height 5' (1.524 m), weight 51.6 kg (113 lb 11.2 oz), SpO2 99 %.  Intake/Output Summary (Last 24 hours) at 09/18/16 1326 Last data filed at 09/18/16 0551  Gross per 24 hour  Intake               90 ml  Output                0 ml  Net               90 ml   Filed Weights   09/14/16 2055 09/17/16 0418 09/18/16 0541  Weight: 48.3 kg (106 lb 6.4 oz) 52.1 kg (114 lb 12.8 oz) 51.6 kg (113 lb 11.2 oz)    Examination: General: No acute respiratory distress  Lungs: poor air movement left base - no wheezing  Cardiovascular: Regular rate without appreciable murmur Abdomen: Nontender, nondistended, soft, bowel sounds positive, no rebound Extremities: No significant edema bilateral lower extremities  CBC:  Recent Labs Lab 09/14/16 1136 09/14/16 1200 09/15/16 0418 09/16/16 0516 09/17/16 0345  WBC 19.1*  --  12.0* 11.5* 9.9  NEUTROABS 16.4*  --   --  10.7* 8.7*  HGB 11.9* 12.2 9.6* 9.4* 9.5*  HCT 36.7 36.0 29.6* 29.9* 30.0*  MCV 94.1  --  91.9 94.6 95.5  PLT 372  --  280 297 595   Basic Metabolic Panel:  Recent Labs Lab 09/14/16 1200 09/14/16 1521 09/14/16 1709 09/15/16 0418 09/15/16 1509 09/16/16 0516 09/17/16 0345 09/18/16 0440  NA 133* 136  --  136  --  139 139  --   K 3.7 3.3*  --  2.8* 3.7 4.6 3.2*  --   CL 97* 104  --  106  --  110 112*  --   CO2  --  20*  --  21*  --  23 23  --   GLUCOSE 232* 140*  --  121*  --  170* 138*  --   BUN 46* 35*  --  27*  --  14 10  --   CREATININE 1.10* 0.90  --  0.74  --  0.75 0.66  --   CALCIUM  --  7.7*  --  7.2*  --  7.1* 7.1*  --   MG  --   --  1.9  --  1.9 1.8 1.9 1.8  PHOS  --   --  3.3  --   --   --   --   --    GFR: Estimated Creatinine Clearance: 34.9 mL/min (by C-G formula based on SCr of 0.66 mg/dL).  Liver Function Tests:  Recent Labs Lab 09/14/16 1521 09/15/16 0418  AST  39 29  ALT 25 24  ALKPHOS 62 59  BILITOT 0.9 0.6  PROT 5.0* 4.9*  ALBUMIN 2.0* 1.9*  Recent Labs Lab 09/14/16 1521  LIPASE 22   Coagulation Profile:  Recent Labs Lab 09/14/16 1709  INR 1.86    HbA1C: Hgb A1c MFr Bld  Date/Time Value Ref Range Status  09/14/2016 04:21 PM 5.4 4.8 - 5.6 % Final    Comment:    (NOTE)         Pre-diabetes: 5.7 - 6.4         Diabetes: >6.4         Glycemic control for adults with diabetes: <7.0   10/24/2014 01:23 AM 5.6 4.8 - 5.6 % Final    Comment:    (NOTE)         Pre-diabetes: 5.7 - 6.4         Diabetes: >6.4         Glycemic control for adults with diabetes: <7.0     CBG:  Recent Labs Lab 09/17/16 1113 09/17/16 1612 09/17/16 2137 09/18/16 0734 09/18/16 1132  GLUCAP 176* 223* 217* 101* 134*    Recent Results (from the past 240 hour(s))  Blood culture (routine x 2)     Status: None (Preliminary result)   Collection Time: 09/14/16  3:19 PM  Result Value Ref Range Status   Specimen Description BLOOD RIGHT ANTECUBITAL  Final   Special Requests IN PEDIATRIC BOTTLE 1CC  Final   Culture NO GROWTH 4 DAYS  Final   Report Status PENDING  Incomplete  Blood culture (routine x 2)     Status: None (Preliminary result)   Collection Time: 09/14/16  3:26 PM  Result Value Ref Range Status   Specimen Description BLOOD RIGHT HAND  Final   Special Requests IN PEDIATRIC BOTTLE 1CC  Final   Culture NO GROWTH 4 DAYS  Final   Report Status PENDING  Incomplete  Respiratory Panel by PCR     Status: None   Collection Time: 09/14/16  3:47 PM  Result Value Ref Range Status   Adenovirus NOT DETECTED NOT DETECTED Final   Coronavirus 229E NOT DETECTED NOT DETECTED Final   Coronavirus HKU1 NOT DETECTED NOT DETECTED Final   Coronavirus NL63 NOT DETECTED NOT DETECTED Final   Coronavirus OC43 NOT DETECTED NOT DETECTED Final   Metapneumovirus NOT DETECTED NOT DETECTED Final   Rhinovirus / Enterovirus NOT DETECTED NOT DETECTED Final   Influenza  A NOT DETECTED NOT DETECTED Final   Influenza B NOT DETECTED NOT DETECTED Final   Parainfluenza Virus 1 NOT DETECTED NOT DETECTED Final   Parainfluenza Virus 2 NOT DETECTED NOT DETECTED Final   Parainfluenza Virus 3 NOT DETECTED NOT DETECTED Final   Parainfluenza Virus 4 NOT DETECTED NOT DETECTED Final   Respiratory Syncytial Virus NOT DETECTED NOT DETECTED Final   Bordetella pertussis NOT DETECTED NOT DETECTED Final   Chlamydophila pneumoniae NOT DETECTED NOT DETECTED Final   Mycoplasma pneumoniae NOT DETECTED NOT DETECTED Final  Culture, Urine     Status: Abnormal   Collection Time: 09/14/16  4:47 PM  Result Value Ref Range Status   Specimen Description URINE, RANDOM  Final   Special Requests NONE  Final   Culture MULTIPLE SPECIES PRESENT, SUGGEST RECOLLECTION (A)  Final   Report Status 09/15/2016 FINAL  Final  MRSA PCR Screening     Status: None   Collection Time: 09/14/16 10:03 PM  Result Value Ref Range Status   MRSA by PCR NEGATIVE NEGATIVE Final    Comment:        The GeneXpert MRSA Assay (FDA approved for NASAL specimens only),  is one component of a comprehensive MRSA colonization surveillance program. It is not intended to diagnose MRSA infection nor to guide or monitor treatment for MRSA infections.      Scheduled Meds: . apixaban  2.5 mg Oral BID  . dextromethorphan-guaiFENesin  1 tablet Oral BID  . feeding supplement  1 Container Oral TID BM  . folic acid  1 mg Oral Daily  . HYDROcodone-acetaminophen  1 tablet Oral BID  . hydrocortisone sod succinate (SOLU-CORTEF) inj  50 mg Intravenous Q12H  . insulin aspart  0-5 Units Subcutaneous QHS  . insulin aspart  0-9 Units Subcutaneous TID WC  . ipratropium-albuterol  3 mL Nebulization TID  . [START ON 09/19/2016] methotrexate  15 mg Oral Q Sat  . piperacillin-tazobactam (ZOSYN)  IV  3.375 g Intravenous Q8H  . simvastatin  10 mg Oral q1800  . sodium chloride flush  3 mL Intravenous Q12H     LOS: 4 days   Cherene Altes, MD [facility] Office  [phone] Pager - Text Page per Amion as per below:  On-Call/Text Page:      [email or web address]      password [email or web address]  If 7PM-7AM, please contact night-coverage [email or web address] Password [email or web address] 09/18/2016, 1:26 PM

## 2016-09-18 NOTE — Consult Note (Signed)
Consultation Note Date: 09/18/2016   Patient Name: Debbie Kramer  DOB: March 06, 1926  MRN: [id number]  Age / Sex: 83 y.o., female  PCP: Haywood Pao, MD Referring Physician: Cherene Altes, MD  Reason for Consultation: Establishing goals of care  HPI/Patient Profile: 83 y.o. female  with past medical history of CVA, A. fib, chronic CHF, COPD, hypertension, rheumatoid arthritis admitted on 09/14/2016 with sepsis secondary to suspected pneumonia and found to have lung mass consistent with cancer. Consulted for goals of care.  Clinical Assessment and Goals of Care: Met today with patient, her daughter Debbie Kramer, and her daughter Debbie Kramer.  I introduced palliative care as specialized medical care for people living with serious illness. It focuses on providing relief from the symptoms and stress of a serious illness. The goal is to improve quality of life for both the patient and the family.  Reviewed her clinical course this point in time including the fact that she had suspected lung cancer and she has been clear that she has been getting "tired" and does not want to pursue further workup.  Patient reports the most important things to her family, being comfortable, and being out of the hospital.   Her family is clear and desire to transition to [facility] for trial of rehabilitation to maximize her functional status while giving them a chance to prepare for enrolling in hospice. When she is discharged from rehabilitation, family will decide on transitioning home (to daughter Debbie Kramer's house) with hospice support versus placement at residential hospice facility for end-of-life care.  This will depend upon her clinical course over the next couple of weeks at rehabilitation facility.  We discussed palliative care outpatient service following her at skilled facility in order to help family determine best plan forward based upon  her clinical course at skilled facility.  Overarching goal of her care moving forward is on comfort and she does not want to return to the hospital in the future. Completed a most form in order to outline these goals.    SUMMARY OF RECOMMENDATIONS    We completed MOST form today. DNR, Comfort Measures, Determine ABX at time of infection, IVF for time limited trial (if indicated for comfort- we discussed that situation utilizing fluids for comfort would be unlikely), no feeding tube.  DO NOT RESUSCITATE completed and on chart  Made a copy of her healthcare power of attorney and placed on chart  Plan to discharge to [facility] for short course of rehab.  If she continues to worsen, overall goal is for comfort and she does not want to return to the hospital. Palliative care to follow her nursing facility (please place order for palliative care on discharge summary)  Once she has maximized rehabilitation potential, plan is to enroll in hospice. This will be either hospice at her daughter's home versus residential hospice based upon her clinical course. Palliative care to follow at skilled facility to help determine most appropriate plan for hospice at discharge from skilled facility.  Code Status/Advance Care Planning:  DNR  Palliative Prophylaxis:   Bowel Regimen, Delirium Protocol and Frequent Pain Assessment  Additional Recommendations (Limitations, Scope, Preferences):  Plan for trial of rehab to maximize functional status, but overall goal moving forward is comfort.  Psycho-social/Spiritual:   Desire for further Chaplaincy support:no  Additional Recommendations: Education on Hospice  Prognosis:   < 6 months  Discharge Planning: [place] for rehab with Palliative care service follow-up      Primary Diagnoses: Present on Admission: . Sepsis ([place]) . Essential hypertension . Paroxysmal atrial fibrillation ([facility]) . Rheumatoid arthritis ([place]) . CAP (community  acquired pneumonia)   I have reviewed the medical record, interviewed the patient and family, and examined the patient. The following aspects are pertinent.  Past Medical History:  Diagnosis Date  . Atrial fibrillation ([place])   . Bronchitis   . CHF (congestive heart failure) ([place])   . COPD (chronic obstructive pulmonary disease) ([place])   . CVA (cerebral vascular accident) ([place])   . Degenerative disc disease   . Hypertension   . Rheumatoid arthritis(714.0)    Social History   Social History  . Marital status: Widowed    Spouse name: N/A  . Number of children: 5  . Years of education: 10   Occupational History  . Retired     retired   Social History Main Topics  . Smoking status: Former Smoker    Packs/day: 0.00    Years: 50.00    Types: Cigarettes    Quit date: 08/04/2015  . Smokeless tobacco: None     Comment: Smokes a few cigarettes per day.    . Alcohol use No  . Drug use: No  . Sexual activity: Not Asked   Other Topics Concern  . None   Social History Narrative   Lives with daughter, Debbie Kramer; widowed   Right handed   Caffeine use - none   Family History  Problem Relation Age of Onset  . Stroke Father   . Cancer Father     lung  . Cancer Mother   . Diabetes Brother   . Stroke Maternal Grandmother    Scheduled Meds: . apixaban  2.5 mg Oral BID  . HYDROcodone-acetaminophen  1 tablet Oral BID  . insulin aspart  0-5 Units Subcutaneous QHS  . insulin aspart  0-9 Units Subcutaneous TID WC  . ipratropium-albuterol  3 mL Nebulization TID  . [START ON 09/19/2016] methotrexate  15 mg Oral Q Sat  . simvastatin  10 mg Oral q1800  . sodium chloride flush  3 mL Intravenous Q12H   Continuous Infusions: PRN Meds:.acetaminophen, albuterol, dextromethorphan-guaiFENesin, feeding supplement, promethazine Medications Prior to Admission:  Prior to Admission medications   Medication Sig Start Date End Date Taking? Authorizing Provider  albuterol (PROVENTIL HFA;VENTOLIN HFA)  108 (90 BASE) MCG/ACT inhaler Inhale 2 puffs into the lungs every 4 (four) hours as needed for wheezing or shortness of breath. 07/07/15  Yes Jola Schmidt, MD  apixaban (ELIQUIS) 2.5 MG TABS tablet Take 1 tablet (2.5 mg total) by mouth 2 (two) times daily. 09/08/16  Yes Sherran Needs, NP  benazepril (LOTENSIN) 20 MG tablet Take 20 mg by mouth daily.     Yes Historical Provider, MD  Calcium Citrate-Vitamin D (CITRACAL PETITES/VITAMIN D PO) Take 1 tablet by mouth daily.    Yes Historical Provider, MD  feeding supplement (BOOST / RESOURCE BREEZE) LIQD Take 1 Container by mouth 3 (three) times daily between meals. 08/08/15  Yes Hosie Poisson, MD  Fluticasone Furoate-Vilanterol (BREO ELLIPTA) 100-25  MCG/INH AEPB Inhale 1 puff into the lungs daily.   Yes Historical Provider, MD  folic acid (FOLVITE) 1 MG tablet Take 1 mg by mouth daily.   Yes Historical Provider, MD  furosemide (LASIX) 20 MG tablet Take 20 mg by mouth daily. 07/14/16  Yes Historical Provider, MD  HYDROcodone-acetaminophen (NORCO/VICODIN) 5-325 MG per tablet Take 1 tablet by mouth 2 (two) times daily.  11/24/14  Yes Historical Provider, MD  INCRUSE ELLIPTA 62.5 MCG/INH AEPB Take 1 Inhaler by mouth at bedtime. 08/16/16  Yes Historical Provider, MD  InFLIXimab (REMICADE IV) Inject 200 mg into the vein every 8 (eight) weeks.    Yes Historical Provider, MD  methotrexate 2.5 MG tablet Take 15 mg by mouth once a week.    Yes Historical Provider, MD  metoprolol tartrate (LOPRESSOR) 25 MG tablet Take 0.5 tablets (12.5 mg total) by mouth 2 (two) times daily. Please call and schedule a follow up appt for further refills [phone] Patient taking differently: Take 12.5 mg by mouth daily. Please call and schedule a follow up appt for further refills [phone] 09/03/16  Yes Minus Breeding, MD  Multiple Vitamin (MULTIVITAMIN PO) Take 1 capsule by mouth daily.    Yes Historical Provider, MD  simvastatin (ZOCOR) 10 MG tablet Take 1 tablet (10 mg total) by  mouth daily at 6 PM. 10/25/14  Yes Eugenie Filler, MD  alendronate (FOSAMAX) 70 MG tablet Take 70 mg by mouth once a week. Take with a full glass of water on an empty stomach.    Historical Provider, MD   No Known Allergies Review of Systems  Reports intermittent shortness of breath.  Physical Exam  General: Alert, awake, in no acute distress.  HEENT: No bruits, no goiter, no JVD Heart: Regular rate and rhythm. No murmur appreciated. Lungs: significantly diminished air movement, clear Abdomen: Soft, nontender, nondistended, positive bowel sounds.  Ext: No significant edema Skin: Warm and dry Neuro: Grossly intact, nonfocal.  Vital Signs: BP 129/70 (BP Location: Right Arm)   Pulse 89   Temp 97.4 F (36.3 C) (Oral)   Resp (!) 23   Ht 5' (1.524 m)   Wt 51.6 kg (113 lb 11.2 oz)   LMP  (LMP Unknown) Comment: Postmenopausal  SpO2 100%   BMI 22.21 kg/m  Pain Assessment: No/denies pain   Pain Score: 0-No pain   SpO2: SpO2: 100 % O2 Device:SpO2: 100 % O2 Flow Rate: .O2 Flow Rate (L/min): 3 L/min  IO: Intake/output summary:  Intake/Output Summary (Last 24 hours) at 09/18/16 1848 Last data filed at 09/18/16 0551  Gross per 24 hour  Intake               90 ml  Output                0 ml  Net               90 ml    LBM: Last BM Date: 09/17/16 Baseline Weight: Weight: 58.1 kg (128 lb 1.4 oz) Most recent weight: Weight: 51.6 kg (113 lb 11.2 oz)     Palliative Assessment/Data:   Flowsheet Rows   Flowsheet Row Most Recent Value  Intake Tab  Referral Department  Hospitalist  Unit at Time of Referral  Cardiac/Telemetry Unit  Palliative Care Primary Diagnosis  Cardiac  Date Notified  09/17/16  Palliative Care Type  New Palliative care  Reason for referral  Clarify Goals of Care  Date of Admission  09/14/16  # of  days IP prior to Palliative referral  3  Clinical Assessment  Psychosocial & Spiritual Assessment  Palliative Care Outcomes     Time Total: 85 Greater  than 50%  of this time was spent counseling and coordinating care related to the above assessment and plan.  Signed by: Micheline Rough, MD   Please contact Palliative Medicine Team phone at [phone] for questions and concerns.  For individual provider: See Shea Evans

## 2016-09-18 NOTE — Clinical Social Work Note (Signed)
Arrangements made for the patient to DC to [facility] on Saturday. CSW will facilitate DC to [facility] once appropriate.   Liz Beach MSW, [place], [employer], [phone]

## 2016-09-18 NOTE — Progress Notes (Signed)
PT Cancellation Note  Patient Details Name: Debbie Kramer MRN: [id number] DOB: September 24, 1925   Cancelled Treatment:      Pt declined therapy. She was confused and no family member was present. She stated she did not want to get out of bed at this time. Therapy will F/U this afternoon if able or on 09/19/2016.    Carney Living 09/18/2016, 2:41 PM

## 2016-09-18 NOTE — Progress Notes (Signed)
Palliative consult received.  Plan for family meeting today at 65.  I did speak with HCPOA to set up meeting.  She reports that likely goal on discharge will be to skilled facility for trial of rehab.  She does not feel that home environment is safe (she asked me not to mention this in from of her sister with whom she had been living).  Full consult to follow after family meeting.  Micheline Rough, MD [place] Team [phone]

## 2016-09-18 NOTE — Consult Note (Signed)
   [facility] Inpatient Consult   09/18/2016  JENAI SCALETTA 1926-05-24 [id number]   Chart review for patient who was previously active with [place] [facility] Management services.  Review reveals the patient is to discharge to a skilled facility and also reviewed palliative care's note. No current community [facility] Care Management services identified.  This was the patient's 1 st admission in 6 months.  For questions, please contact:  Natividad Brood, RN BSN [facility] Liaison  [phone] business mobile phone Toll free office [phone]

## 2016-09-18 NOTE — Care Management Important Message (Signed)
Important Message  Patient Details  Name: Debbie Kramer MRN: [id number] Date of Birth: [birth year]   Medicare Important Message Given:  Yes    Nathen May 09/18/2016, 12:14 PM

## 2016-09-18 NOTE — NC FL2 (Signed)
[place] MEDICAID FL2 LEVEL OF CARE SCREENING TOOL     IDENTIFICATION  Patient Name: Debbie Kramer Birthdate: [birth year] Sex: female Admission Date (Current Location): 09/14/2016  [facility] and [place] Number:  Herbalist and Address:  The [place]. [facility] [place] [address] [place], [place] [address]      Provider Number: [id number]  Attending Physician Name and Address:  Cherene Altes, MD  Relative Name and Phone Number:       Current Level of Care: Hospital Recommended Level of Care: [place] Prior Approval Number:    Date Approved/Denied:   PASRR Number: [id number]  Discharge Plan: SNF    Current Diagnoses: Patient Active Problem List   Diagnosis Date Noted  . Neoplasm of uncertain behavior of trachea, bronchus, and lung   . Postobstructive pneumonia   . Mediastinal mass   . Sepsis ([place]) 09/14/2016  . Hypotension 09/14/2016  . SMA stenosis ([place]) 09/14/2016  . AKI (acute kidney injury) ([place]) 09/14/2016  . Hyperglycemia 09/14/2016  . Chronic combined systolic and diastolic CHF (congestive heart failure) ([place]) 09/14/2016  . Elevated lactic acid level   . Paroxysmal atrial fibrillation ([place]) 02/19/2016  . Malnutrition of moderate degree 08/08/2015  . CAP (community acquired pneumonia) 08/04/2015  . Elevated troponin 08/04/2015  . Abnormal ECG 08/04/2015  . Prolonged QT interval 08/04/2015  . Acute CVA (cerebrovascular accident) ([place]) 10/23/2014  . Essential hypertension 10/23/2014  . Tobacco abuse 10/23/2014  . Rheumatoid arthritis ([place]) 03/04/2011  . Osteoarthritis 03/04/2011  . Degenerative disc disease 03/04/2011    Orientation RESPIRATION BLADDER Height & Weight     Self, Time, Situation, Place  O2 (3L) Continent, Incontinent Weight: 51.6 kg (113 lb 11.2 oz) Height:  5' (152.4 cm)  BEHAVIORAL SYMPTOMS/MOOD NEUROLOGICAL BOWEL NUTRITION STATUS   (NONE)  (NONE) Continent, Incontinent Diet (Regular)   AMBULATORY STATUS COMMUNICATION OF NEEDS Skin   Extensive Assist Verbally Normal                       Personal Care Assistance Level of Assistance  Bathing, Dressing, Feeding Bathing Assistance: Limited assistance Feeding assistance: Independent Dressing Assistance: Limited assistance     Functional Limitations Info  Sight, Hearing, Speech Sight Info: Adequate Hearing Info: Adequate Speech Info: Adequate    SPECIAL CARE FACTORS FREQUENCY  PT (By licensed PT), OT (By licensed OT)     PT Frequency: 5/week OT Frequency: 5/week            Contractures Contractures Info: Not present    Additional Factors Info  Code Status, Allergies, Insulin Sliding Scale Code Status Info: DNR Allergies Info: NKDA   Insulin Sliding Scale Info: 3/day       Current Medications (09/18/2016):  This is the current hospital active medication list Current Facility-Administered Medications  Medication Dose Route Frequency Provider Last Rate Last Dose  . acetaminophen (TYLENOL) tablet 650 mg  650 mg Oral Q6H PRN Cherene Altes, MD   650 mg at 09/17/16 2011  . albuterol (PROVENTIL) (2.5 MG/3ML) 0.083% nebulizer solution 2.5 mg  2.5 mg Nebulization Q4H PRN Cherene Altes, MD      . albuterol (PROVENTIL) (2.5 MG/3ML) 0.083% nebulizer solution 3 mL  3 mL Inhalation Q4H PRN Waldemar Dickens, MD   3 mL at 09/17/16 [phone]  . apixaban (ELIQUIS) tablet 2.5 mg  2.5 mg Oral BID Waldemar Dickens, MD   2.5 mg at 09/18/16 4132  . dextromethorphan-guaiFENesin [facility]  DM) 30-600 MG per 12 hr tablet 1 tablet  1 tablet Oral BID Allie Bossier, MD   1 tablet at 09/18/16 [phone]  . feeding supplement (BOOST / RESOURCE BREEZE) liquid 1 Container  1 Container Oral TID BM Waldemar Dickens, MD   1 Container at 11/88/67 7373  . folic acid (FOLVITE) tablet 1 mg  1 mg Oral Daily Cherene Altes, MD   1 mg at 09/18/16 6681  . HYDROcodone-acetaminophen (NORCO/VICODIN) 5-325 MG per tablet 1 tablet  1 tablet Oral BID  Waldemar Dickens, MD   1 tablet at 09/18/16 [phone]  . hydrocortisone sodium succinate (SOLU-CORTEF) 100 MG injection 50 mg  50 mg Intravenous Q12H Cherene Altes, MD   50 mg at 09/18/16 0540  . insulin aspart (novoLOG) injection 0-5 Units  0-5 Units Subcutaneous QHS Waldemar Dickens, MD   2 Units at 09/17/16 2144  . insulin aspart (novoLOG) injection 0-9 Units  0-9 Units Subcutaneous TID WC Waldemar Dickens, MD   2 Units at 09/16/16 1700  . ipratropium-albuterol (DUONEB) 0.5-2.5 (3) MG/3ML nebulizer solution 3 mL  3 mL Nebulization TID Cherene Altes, MD   3 mL at 09/18/16 0811  . [START ON 09/19/2016] methotrexate (RHEUMATREX) tablet 15 mg  15 mg Oral Q Sat Waldemar Dickens, MD      . piperacillin-tazobactam (ZOSYN) IVPB 3.375 g  3.375 g Intravenous Q8H Cherene Altes, MD   3.375 g at 09/18/16 0540  . promethazine (PHENERGAN) tablet 12.5 mg  12.5 mg Oral Q6H PRN Waldemar Dickens, MD      . simvastatin (ZOCOR) tablet 10 mg  10 mg Oral q1800 Waldemar Dickens, MD   10 mg at 09/17/16 1746  . sodium chloride flush (NS) 0.9 % injection 3 mL  3 mL Intravenous Q12H Waldemar Dickens, MD   3 mL at 09/17/16 2122     Discharge Medications: Please see discharge summary for a list of discharge medications.  Relevant Imaging Results:  Relevant Lab Results:   Additional Information SSN: [id number] Patient to receive palliative services at SNF.  Rigoberto Noel, LCSW

## 2016-09-19 DIAGNOSIS — I5032 Chronic diastolic (congestive) heart failure: Secondary | ICD-10-CM | POA: Diagnosis not present

## 2016-09-19 DIAGNOSIS — I739 Peripheral vascular disease, unspecified: Secondary | ICD-10-CM | POA: Diagnosis not present

## 2016-09-19 DIAGNOSIS — J9859 Other diseases of mediastinum, not elsewhere classified: Secondary | ICD-10-CM | POA: Diagnosis not present

## 2016-09-19 DIAGNOSIS — C3492 Malignant neoplasm of unspecified part of left bronchus or lung: Secondary | ICD-10-CM | POA: Diagnosis not present

## 2016-09-19 DIAGNOSIS — R627 Adult failure to thrive: Secondary | ICD-10-CM | POA: Diagnosis not present

## 2016-09-19 DIAGNOSIS — M6281 Muscle weakness (generalized): Secondary | ICD-10-CM | POA: Diagnosis not present

## 2016-09-19 DIAGNOSIS — D381 Neoplasm of uncertain behavior of trachea, bronchus and lung: Secondary | ICD-10-CM | POA: Diagnosis not present

## 2016-09-19 DIAGNOSIS — J449 Chronic obstructive pulmonary disease, unspecified: Secondary | ICD-10-CM | POA: Diagnosis not present

## 2016-09-19 DIAGNOSIS — R1319 Other dysphagia: Secondary | ICD-10-CM | POA: Diagnosis not present

## 2016-09-19 DIAGNOSIS — J189 Pneumonia, unspecified organism: Secondary | ICD-10-CM | POA: Diagnosis not present

## 2016-09-19 DIAGNOSIS — M05712 Rheumatoid arthritis with rheumatoid factor of left shoulder without organ or systems involvement: Secondary | ICD-10-CM | POA: Diagnosis not present

## 2016-09-19 DIAGNOSIS — R2689 Other abnormalities of gait and mobility: Secondary | ICD-10-CM | POA: Diagnosis not present

## 2016-09-19 DIAGNOSIS — I480 Paroxysmal atrial fibrillation: Secondary | ICD-10-CM | POA: Diagnosis not present

## 2016-09-19 DIAGNOSIS — R0602 Shortness of breath: Secondary | ICD-10-CM | POA: Diagnosis not present

## 2016-09-19 DIAGNOSIS — R278 Other lack of coordination: Secondary | ICD-10-CM | POA: Diagnosis not present

## 2016-09-19 DIAGNOSIS — R222 Localized swelling, mass and lump, trunk: Secondary | ICD-10-CM | POA: Diagnosis not present

## 2016-09-19 DIAGNOSIS — I5042 Chronic combined systolic (congestive) and diastolic (congestive) heart failure: Secondary | ICD-10-CM

## 2016-09-19 DIAGNOSIS — R41841 Cognitive communication deficit: Secondary | ICD-10-CM | POA: Diagnosis not present

## 2016-09-19 DIAGNOSIS — R5383 Other fatigue: Secondary | ICD-10-CM | POA: Diagnosis not present

## 2016-09-19 DIAGNOSIS — R6510 Systemic inflammatory response syndrome (SIRS) of non-infectious origin without acute organ dysfunction: Secondary | ICD-10-CM | POA: Diagnosis not present

## 2016-09-19 DIAGNOSIS — C3490 Malignant neoplasm of unspecified part of unspecified bronchus or lung: Secondary | ICD-10-CM | POA: Diagnosis not present

## 2016-09-19 DIAGNOSIS — I4891 Unspecified atrial fibrillation: Secondary | ICD-10-CM | POA: Diagnosis not present

## 2016-09-19 DIAGNOSIS — I509 Heart failure, unspecified: Secondary | ICD-10-CM | POA: Diagnosis not present

## 2016-09-19 DIAGNOSIS — R0902 Hypoxemia: Secondary | ICD-10-CM | POA: Diagnosis not present

## 2016-09-19 LAB — CULTURE, BLOOD (ROUTINE X 2)
Culture: NO GROWTH
Culture: NO GROWTH

## 2016-09-19 LAB — GLUCOSE, CAPILLARY: Glucose-Capillary: 98 mg/dL (ref 65–99)

## 2016-09-19 MED ORDER — BOOST / RESOURCE BREEZE PO LIQD
1.0000 | Freq: Three times a day (TID) | ORAL | 0 refills | Status: AC | PRN
Start: 2016-09-19 — End: ?

## 2016-09-19 MED ORDER — ACETAMINOPHEN 325 MG PO TABS: 650.0000 mg | ORAL_TABLET | Freq: Four times a day (QID) | ORAL | Status: AC | PRN

## 2016-09-19 MED ORDER — DM-GUAIFENESIN ER 30-600 MG PO TB12: 1.0000 | ORAL_TABLET | Freq: Two times a day (BID) | ORAL | Status: AC | PRN

## 2016-09-19 MED ORDER — PROMETHAZINE HCL 12.5 MG PO TABS: 12.5000 mg | ORAL_TABLET | Freq: Four times a day (QID) | ORAL | 0 refills | Status: AC | PRN

## 2016-09-19 MED ORDER — ALBUTEROL SULFATE (2.5 MG/3ML) 0.083% IN NEBU: 2.5000 mg | INHALATION_SOLUTION | RESPIRATORY_TRACT | 12 refills | Status: AC | PRN

## 2016-09-19 MED ORDER — HYDROCODONE-ACETAMINOPHEN 5-325 MG PO TABS: 1.0000 | ORAL_TABLET | Freq: Two times a day (BID) | ORAL | 0 refills | Status: AC

## 2016-09-19 NOTE — Discharge Instructions (Signed)
Palliative Care  Palliative care is the care of your body, mind, and spirit. Palliative care services are offered to people dealing with serious and life-threatening illnesses, often in the hospital or a long-term care setting. Palliative care requires a team of people who will help to ensure:  Pain and symptoms are controlled.  Family support.  Spiritual support.  Emotional and social support.  Comfort. Palliative care is a way to bring comfort and peace of mind to a person and his or her family. It can also have a positive impact on the course of illness. Who can receive palliative care services? Palliative care is offered to children and adults when they are seriously ill and may not be responding well to treatment options. The person may be undergoing active treatment, such as chemotherapy, or may have stopped active treatment. Some people may have just been diagnosed with advanced disease or life-limiting illness. A health care provider will usually recommend palliative care services when more support would be helpful. What types of services are included in palliative care? Palliative care includes a team of health care providers and supporters who come together to help a person facing serious and life-threatening illness. The person's existing doctors are included in the care team, and supporters are added. Family members and friends may also receive palliative care services to cope with stress and other concerns. Services are different for each person and are based on the person's needs and preferences. The following people make up a palliative care team:  The person receiving care and his or her family.  Physicians, including primary health care providers and specialists.  Nurses.  A psychosocial worker. Other people on the palliative care team may include:  A pain specialist and sometimes a hospice specialist.  A financial or Research officer, trade union.  [employer], such  as school and spiritual organizations.  Religious leaders.  A care coordinator or case manager.  A bereavement coordinator. The team will talk with the person and his or her family about:  The role of the pain specialist and the hospice specialist if this applies.  The person's active physical symptoms, such as pain, nausea, vomiting, and shortness of breath.  Stress, depression, and anxiety symptoms.  Function and mobility issues and how to stay as active as possible.  Treatment options and how they may affect life.  Spiritual wishes, such as rituals and prayer.  Legacy and memory making activities.  Life and death as a normal process.  Advance directives or living wills, health care proxies, and end-of-life care.  Any other concerns or issues. The palliative care team will make it okay to talk about difficult issues and topics. Preferences and sensitive spiritual and emotional concerns are of high importance. Palliative care and hospice are somewhat different Palliative care and hospice care have similar goals of managing symptoms, promoting comfort, and maintaining dignity. However, hospice care is an option for people during the last 6 months of life expectancy. The palliative care team will coordinate the many support services provided during any phase of serious illness. This information is not intended to replace advice given to you by your health care provider. Make sure you discuss any questions you have with your health care provider. Document Released: 07/25/2013 Document Revised: 12/26/2015 Document Reviewed: 06/06/2013 Elsevier Interactive Patient Education  2017 [employer].

## 2016-09-19 NOTE — Clinical Social Work Note (Signed)
Clinical Social Worker facilitated patient discharge including contacting patient family Tammi Klippel) and facility ([facility]) to confirm patient discharge plans. Clinical information faxed to facility and family agreeable with plan. CSW arranged ambulance transport via [facility] to .  RN to call report [phone] prior to discharge.  Clinical Social Worker will sign off for now as social work intervention is no longer needed. Please consult [place] again if new need arises.  Maudry Zeidan B. Joline Maxcy Clinical Social Work Dept Weekend Social Worker [phone] 9:26 AM

## 2016-09-19 NOTE — Clinical Social Work Placement (Signed)
   CLINICAL SOCIAL WORK PLACEMENT  NOTE  Date:  09/19/2016  Patient Details  Name: Debbie Kramer MRN: [id number] Date of Birth: 1925/08/21  Clinical Social Work is seeking post-discharge placement for this patient at the [place] level of care (*CSW will initial, date and re-position this form in  chart as items are completed):  Yes   Patient/family provided with [place] Work Department's list of facilities offering this level of care within the geographic area requested by the patient (or if unable, by the patient's family).  Yes   Patient/family informed of their freedom to choose among providers that offer the needed level of care, that participate in Medicare, Medicaid or managed care program needed by the patient, have an available bed and are willing to accept the patient.  Yes   Patient/family informed of [place]'s ownership interest in [facility] and [facility], as well as of the fact that they are under no obligation to receive care at these facilities.  PASRR submitted to EDS on       PASRR number received on       Existing PASRR number confirmed on 09/19/16     FL2 transmitted to all facilities in geographic area requested by pt/family on       FL2 transmitted to all facilities within larger geographic area on       Patient informed that his/her managed care company has contracts with or will negotiate with certain facilities, including the following:        Yes   Patient/family informed of bed offers received.  Patient chooses bed at  [facility])     Physician recommends and patient chooses bed at      Patient to be transferred to  ([facility]) on 09/19/16.  Patient to be transferred to facility by  [facility])     Patient family notified on 09/19/16 of transfer.  Name of family member notified:  Dtr - Tammi Klippel     PHYSICIAN       Additional Comment:    _______________________________________________ Serafina Speagle, LCSWA 09/19/2016, 9:28 AM

## 2016-09-19 NOTE — Care Management Note (Signed)
Case Management Note  Patient Details  Name: Debbie Kramer MRN: [id number] Date of Birth: 1925/08/31  Subjective/Objective:  83 y.o. F admitted 09/14/2016 to be discharged to [facility] SNF                   Action/Plan:CM will sign off for now but will be available should additional discharge needs arise or disposition change.    Expected Discharge Date:  09/19/16               Expected Discharge Plan:  Skilled Nursing Facility  In-House Referral:  Clinical Social Work  Discharge planning Services  CM Consult  Post Acute Care Choice:  NA Choice offered to:  NA  DME Arranged:  N/A DME Agency:  NA  HH Arranged:  NA HH Agency:     Status of Service:  Completed, signed off  If discussed at [employer], dates discussed:    Additional Comments:  Delrae Sawyers, RN 09/19/2016, 9:19 AM

## 2016-09-19 NOTE — Progress Notes (Signed)
Report called to Suzy Bouchard, RN at [facility] SNF for transport with [facility] via ambulance stretcher.

## 2016-09-19 NOTE — Discharge Summary (Signed)
DISCHARGE SUMMARY  Debbie Kramer  MR#: [id number]  DOB:11/26/1925  Date of Admission: 09/14/2016 Date of Discharge: 09/19/2016  Attending Physician:Nafisa Olds T  Patient's AOZ:[id number] W Tisovec, MD  Consults:  none  Disposition: D/C to SNF  _________________________________________________________________________________  TREATMENT PLAN:  -please consult Palliative Care to visit the patient once she has been admitted to her SNF  -DNR, Comfort Measures, Determine ABX at time of infection, IVF for time limited trial (if indicated for comfort- we discussed that situation utilizing fluids for comfort would be unlikely), NO FEEDING TUBE  -DO NOT RESUSCITATE on chart  -Plan to discharge to [facility] for short course of rehab.  If she continues to worsen, overall goal is for comfort and she DOES NOT [place]. Palliative care to follow her at the nursing facility.  -Once she has maximized rehabilitation potential, plan is to enroll in hospice. This will be either hospice at her daughter's home versus residential hospice based upon her clinical course. Palliative care to follow at skilled facility to help determine most appropriate plan for hospice at discharge from skilled facility.  _________________________________________________________________________________  Discharge Diagnoses: Suspected Lung CA  SIRS Chronic grade 2 Diastolic congestive heart failure Hypotension w/ hx of Hypertension Peripheral arterial disease  AKI Hyperglycemia COPD Paroxysmal A-Fib Dysphagia? RA HLD Chronic pain Hypokalemia NO CODE BLUE - DNR   Initial presentation: 83 yo F HxCVA, Atrial fibrillation, chronic CHF, S/P Implantable Loop Recorder, COPD, HTN, and RA who presented w/ a multiple week history of generalized weakness/fatigue with associated poor oral intake. Prior to the onset of lethargy she had been diagnosed with a respiratory illness and was given steroids  as well as an antibiotic.   Hospital Course:  SIRS due to Suspected Lung CA  resp virus panel negative - blood cx negative - CT chest noted pleural-based mass in the upper L hemithorax, a probable malignant L pleuarl effusion, and L hilar and subcarinal LAD - the L hemithorax mass invades the superior mediastinum and is severely narrowing the L innominate vein - there is also a 35m RLL pulm nodule - a LUL soft tissue density was noted on prior CT chest Jan 2017 - the patient and her family have opted to not pursue further workup and to focus instead on quality of life and comfort - Palliative Care visited the pt and family while in the hospital, and established the treatment plan detailed above - please consult Palliative Care to follow the pt in the SNF once she is admitted   Chronic grade 2 Diastolic congestive heart failure TTE 2/13 noted EF 60-65% w/ grade 2 diastolic dysfunction - baseline weight appears to be approximately 57 kg - no evidence of volume overload at this time  [facility]   09/17/16 0418 09/18/16 0541 09/19/16 0458  Weight: 52.1 kg (114 lb 12.8 oz) 51.6 kg (113 lb 11.2 oz) 54.4 kg (120 lb)    Hypotension w/ hx of Hypertension BP stabilized with simple volume resuscitation  Peripheral arterial disease  Proximal stenosis of SMA, left common and external iliac artery stenosis noted on CTangio - Dr. WSherral Hammersdiscussed with Vascular Surgeon who feels that these changes are likely chronic - patient denied any postprandial issues and has strong pedal pulses bilaterally  AKI baseline 0.6 - renal function normalized with volume resuscitation  Recent Labs Lab 09/14/16 1200 09/14/16 1521 09/15/16 0418 09/16/16 0516 09/17/16 0345  CREATININE 1.10* 0.90 0.74 0.75 0.66    Hyperglycemia 2/12 A1c 5.4 - CBGs  well controlled   COPD No acute exacerbation at this time   Paroxysmal A-Fib continue Eliquis for now - currently in NSR  Dysphagia? Cleared for regular diet  by SLP   RA well controlled - continue Remicade and methotrexate for now   HLD continue statin  Chronic pain continuie norco  Hypokalemia Mild - no further forced replacement    Allergies as of 09/19/2016   No Known Allergies     Medication List    STOP taking these medications   albuterol 108 (90 Base) MCG/ACT inhaler Commonly known as:  PROVENTIL HFA;VENTOLIN HFA Replaced by:  albuterol (2.5 MG/3ML) 0.083% nebulizer solution   alendronate 70 MG tablet Commonly known as:  FOSAMAX   benazepril 20 MG tablet Commonly known as:  LOTENSIN   BREO ELLIPTA 100-25 MCG/INH Aepb Generic drug:  fluticasone furoate-vilanterol   CITRACAL PETITES/VITAMIN D PO   folic acid 1 MG tablet Commonly known as:  FOLVITE   furosemide 20 MG tablet Commonly known as:  LASIX   INCRUSE ELLIPTA 62.5 MCG/INH Aepb Generic drug:  umeclidinium bromide   MULTIVITAMIN PO   simvastatin 10 MG tablet Commonly known as:  ZOCOR     TAKE these medications   acetaminophen 325 MG tablet Commonly known as:  TYLENOL Take 2 tablets (650 mg total) by mouth every 6 (six) hours as needed for mild pain, fever or headache.   albuterol (2.5 MG/3ML) 0.083% nebulizer solution Commonly known as:  PROVENTIL Take 3 mLs (2.5 mg total) by nebulization every 2 (two) hours as needed for wheezing. Replaces:  albuterol 108 (90 Base) MCG/ACT inhaler   apixaban 2.5 MG Tabs tablet Commonly known as:  ELIQUIS Take 1 tablet (2.5 mg total) by mouth 2 (two) times daily.   dextromethorphan-guaiFENesin 30-600 MG 12hr tablet Commonly known as:  MUCINEX DM Take 1 tablet by mouth 2 (two) times daily as needed for cough.   feeding supplement Liqd Take 1 Container by mouth 3 (three) times daily between meals as needed (hunger / dietary supplementation). What changed:  when to take this  reasons to take this   HYDROcodone-acetaminophen 5-325 MG tablet Commonly known as:  NORCO/VICODIN Take 1 tablet by mouth 2  (two) times daily.   methotrexate 2.5 MG tablet Take 15 mg by mouth once a week.   metoprolol tartrate 25 MG tablet Commonly known as:  LOPRESSOR Take 0.5 tablets (12.5 mg total) by mouth 2 (two) times daily. Please call and schedule a follow up appt for further refills [phone] What changed:  when to take this  additional instructions   promethazine 12.5 MG tablet Commonly known as:  PHENERGAN Take 1 tablet (12.5 mg total) by mouth every 6 (six) hours as needed for nausea.   REMICADE IV Inject 200 mg into the vein every 8 (eight) weeks.       Day of Discharge BP 132/72   Pulse 90   Temp 98.2 F (36.8 C) (Oral)   Resp 18   Ht 5' (1.524 m)   Wt 54.4 kg (120 lb)   LMP  (LMP Unknown) Comment: Postmenopausal  SpO2 100%   BMI 23.44 kg/m   Physical Exam: General: No acute respiratory distress  Lungs: poor air movement left base w/ no wheezing  Cardiovascular: Regular rate without murmur or rub  Abdomen: Nontender, nondistended, soft, bowel sounds positive, no rebound Extremities: No significant edema bilateral lower extremities  Basic Metabolic Panel:  Recent Labs Lab 09/14/16 1200 09/14/16 1521 09/14/16 1709 09/15/16 0418 09/15/16 1509  09/16/16 0516 09/17/16 0345 09/18/16 0440  NA 133* 136  --  136  --  139 139  --   K 3.7 3.3*  --  2.8* 3.7 4.6 3.2*  --   CL 97* 104  --  106  --  110 112*  --   CO2  --  20*  --  21*  --  23 23  --   GLUCOSE 232* 140*  --  121*  --  170* 138*  --   BUN 46* 35*  --  27*  --  14 10  --   CREATININE 1.10* 0.90  --  0.74  --  0.75 0.66  --   CALCIUM  --  7.7*  --  7.2*  --  7.1* 7.1*  --   MG  --   --  1.9  --  1.9 1.8 1.9 1.8  PHOS  --   --  3.3  --   --   --   --   --     Liver Function Tests:  Recent Labs Lab 09/14/16 1521 09/15/16 0418  AST 39 29  ALT 25 24  ALKPHOS 62 59  BILITOT 0.9 0.6  PROT 5.0* 4.9*  ALBUMIN 2.0* 1.9*    Recent Labs Lab 09/14/16 1521  LIPASE 22    Coags:  Recent Labs Lab  09/14/16 1709  INR 1.86    CBC:  Recent Labs Lab 09/14/16 1136 09/14/16 1200 09/15/16 0418 09/16/16 0516 09/17/16 0345  WBC 19.1*  --  12.0* 11.5* 9.9  NEUTROABS 16.4*  --   --  10.7* 8.7*  HGB 11.9* 12.2 9.6* 9.4* 9.5*  HCT 36.7 36.0 29.6* 29.9* 30.0*  MCV 94.1  --  91.9 94.6 95.5  PLT 372  --  280 297 279    CBG:  Recent Labs Lab 09/18/16 0734 09/18/16 1132 09/18/16 1645 09/18/16 2151 09/19/16 0738  GLUCAP 101* 134* 127* 173* 98    Recent Results (from the past 240 hour(s))  Blood culture (routine x 2)     Status: None (Preliminary result)   Collection Time: 09/14/16  3:19 PM  Result Value Ref Range Status   Specimen Description BLOOD RIGHT ANTECUBITAL  Final   Special Requests IN PEDIATRIC BOTTLE 1CC  Final   Culture NO GROWTH 4 DAYS  Final   Report Status PENDING  Incomplete  Blood culture (routine x 2)     Status: None (Preliminary result)   Collection Time: 09/14/16  3:26 PM  Result Value Ref Range Status   Specimen Description BLOOD RIGHT HAND  Final   Special Requests IN PEDIATRIC BOTTLE 1CC  Final   Culture NO GROWTH 4 DAYS  Final   Report Status PENDING  Incomplete  Respiratory Panel by PCR     Status: None   Collection Time: 09/14/16  3:47 PM  Result Value Ref Range Status   Adenovirus NOT DETECTED NOT DETECTED Final   Coronavirus 229E NOT DETECTED NOT DETECTED Final   Coronavirus HKU1 NOT DETECTED NOT DETECTED Final   Coronavirus NL63 NOT DETECTED NOT DETECTED Final   Coronavirus OC43 NOT DETECTED NOT DETECTED Final   Metapneumovirus NOT DETECTED NOT DETECTED Final   Rhinovirus / Enterovirus NOT DETECTED NOT DETECTED Final   Influenza A NOT DETECTED NOT DETECTED Final   Influenza B NOT DETECTED NOT DETECTED Final   Parainfluenza Virus 1 NOT DETECTED NOT DETECTED Final   Parainfluenza Virus 2 NOT DETECTED NOT DETECTED Final   Parainfluenza Virus 3  NOT DETECTED NOT DETECTED Final   Parainfluenza Virus 4 NOT DETECTED NOT DETECTED Final    Respiratory Syncytial Virus NOT DETECTED NOT DETECTED Final   Bordetella pertussis NOT DETECTED NOT DETECTED Final   Chlamydophila pneumoniae NOT DETECTED NOT DETECTED Final   Mycoplasma pneumoniae NOT DETECTED NOT DETECTED Final  Culture, Urine     Status: Abnormal   Collection Time: 09/14/16  4:47 PM  Result Value Ref Range Status   Specimen Description URINE, RANDOM  Final   Special Requests NONE  Final   Culture MULTIPLE SPECIES PRESENT, SUGGEST RECOLLECTION (A)  Final   Report Status 09/15/2016 FINAL  Final  MRSA PCR Screening     Status: None   Collection Time: 09/14/16 10:03 PM  Result Value Ref Range Status   MRSA by PCR NEGATIVE NEGATIVE Final    Comment:        The GeneXpert MRSA Assay (FDA approved for NASAL specimens only), is one component of a comprehensive MRSA colonization surveillance program. It is not intended to diagnose MRSA infection nor to guide or monitor treatment for MRSA infections.      Time spent in discharge (includes decision making & examination of pt): 35 minutes  09/19/2016, 8:28 AM   Cherene Altes, MD [facility] Office  [phone] Pager [phone]  On-Call/Text Page:      [email or web address]      password [facility]

## 2016-09-22 DIAGNOSIS — C3490 Malignant neoplasm of unspecified part of unspecified bronchus or lung: Secondary | ICD-10-CM | POA: Diagnosis not present

## 2016-09-22 DIAGNOSIS — R0902 Hypoxemia: Secondary | ICD-10-CM | POA: Diagnosis not present

## 2016-09-25 DIAGNOSIS — R0602 Shortness of breath: Secondary | ICD-10-CM | POA: Diagnosis not present

## 2016-09-26 LAB — CUP PACEART REMOTE DEVICE CHECK
Date Time Interrogation Session: 20180114050626
MDC IDC PG IMPLANT DT: 20160324

## 2016-09-26 NOTE — Progress Notes (Signed)
Carelink summary report received. Battery status OK. Normal device function. No new symptom episodes, brady, or pause episodes. No new AF episodes. 2 tachy- appear AT/SVT, per daughter pts health declining otherwise denied symptoms per note in EPIC. Monthly summary reports and ROV/PRN

## 2016-09-29 DIAGNOSIS — I679 Cerebrovascular disease, unspecified: Secondary | ICD-10-CM | POA: Diagnosis not present

## 2016-09-29 DIAGNOSIS — D381 Neoplasm of uncertain behavior of trachea, bronchus and lung: Secondary | ICD-10-CM | POA: Diagnosis not present

## 2016-09-29 DIAGNOSIS — R627 Adult failure to thrive: Secondary | ICD-10-CM | POA: Diagnosis not present

## 2016-09-29 DIAGNOSIS — I4891 Unspecified atrial fibrillation: Secondary | ICD-10-CM | POA: Diagnosis not present

## 2016-09-29 DIAGNOSIS — I5030 Unspecified diastolic (congestive) heart failure: Secondary | ICD-10-CM | POA: Diagnosis not present

## 2016-09-29 DIAGNOSIS — I70209 Unspecified atherosclerosis of native arteries of extremities, unspecified extremity: Secondary | ICD-10-CM | POA: Diagnosis not present

## 2016-09-29 DIAGNOSIS — E785 Hyperlipidemia, unspecified: Secondary | ICD-10-CM | POA: Diagnosis not present

## 2016-09-29 DIAGNOSIS — I25810 Atherosclerosis of coronary artery bypass graft(s) without angina pectoris: Secondary | ICD-10-CM | POA: Diagnosis not present

## 2016-09-29 DIAGNOSIS — C771 Secondary and unspecified malignant neoplasm of intrathoracic lymph nodes: Secondary | ICD-10-CM | POA: Diagnosis not present

## 2016-09-29 DIAGNOSIS — M810 Age-related osteoporosis without current pathological fracture: Secondary | ICD-10-CM | POA: Diagnosis not present

## 2016-09-29 DIAGNOSIS — I10 Essential (primary) hypertension: Secondary | ICD-10-CM | POA: Diagnosis not present

## 2016-09-29 DIAGNOSIS — E46 Unspecified protein-calorie malnutrition: Secondary | ICD-10-CM | POA: Diagnosis not present

## 2016-09-30 DIAGNOSIS — I70209 Unspecified atherosclerosis of native arteries of extremities, unspecified extremity: Secondary | ICD-10-CM | POA: Diagnosis not present

## 2016-09-30 DIAGNOSIS — I4891 Unspecified atrial fibrillation: Secondary | ICD-10-CM | POA: Diagnosis not present

## 2016-09-30 DIAGNOSIS — C771 Secondary and unspecified malignant neoplasm of intrathoracic lymph nodes: Secondary | ICD-10-CM | POA: Diagnosis not present

## 2016-09-30 DIAGNOSIS — I679 Cerebrovascular disease, unspecified: Secondary | ICD-10-CM | POA: Diagnosis not present

## 2016-09-30 DIAGNOSIS — I25810 Atherosclerosis of coronary artery bypass graft(s) without angina pectoris: Secondary | ICD-10-CM | POA: Diagnosis not present

## 2016-09-30 DIAGNOSIS — D381 Neoplasm of uncertain behavior of trachea, bronchus and lung: Secondary | ICD-10-CM | POA: Diagnosis not present

## 2016-10-01 ENCOUNTER — Telehealth: Payer: Self-pay | Admitting: Cardiology

## 2016-10-01 DEATH — deceased

## 2016-10-08 ENCOUNTER — Encounter: Payer: Self-pay | Admitting: Cardiology

## 2016-10-12 LAB — CUP PACEART REMOTE DEVICE CHECK
Implantable Pulse Generator Implant Date: 20160324
MDC IDC SESS DTM: 20180213050847

## 2016-11-01 NOTE — Telephone Encounter (Signed)
Pt daughter informed me that pt passed away. Will un-enroll from carelink and send return kit.

## 2016-11-01 DEATH — deceased

## 2017-06-13 IMAGING — CT CT ANGIO CHEST
2 of 6 series · 18 of 36 positions shown · IV contrast (Omni 300)
Comparison: Chest radiograph earlier this day.

CLINICAL DATA: Progressive shortness of breath for 1 week.
Productive cough.

EXAM:
CT ANGIOGRAPHY CHEST WITH CONTRAST
TECHNIQUE: Multidetector CT imaging of the chest was performed using the
standard protocol during bolus administration of intravenous
contrast. Multiplanar CT image reconstructions and MIPs were
obtained to evaluate the vascular anatomy.
CONTRAST:  80mL OMNIPAQUE IOHEXOL 350 MG/ML SOLN

[Series 6: pe thins · axial · 0.71mm/px · z∈[+711,+982]mm · 17 of 601 slices shown]
[im 29/601  lung]
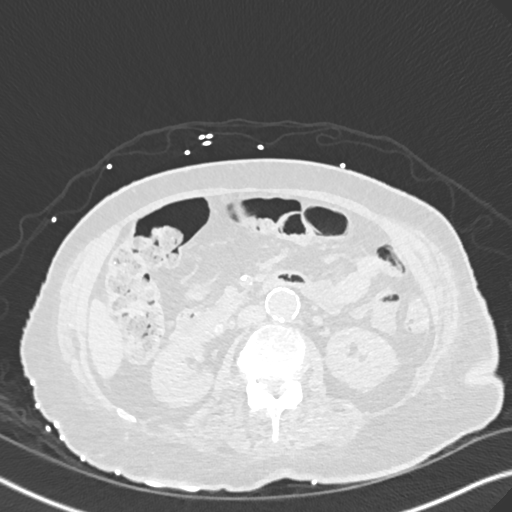
[im 58/601  mediastinal]
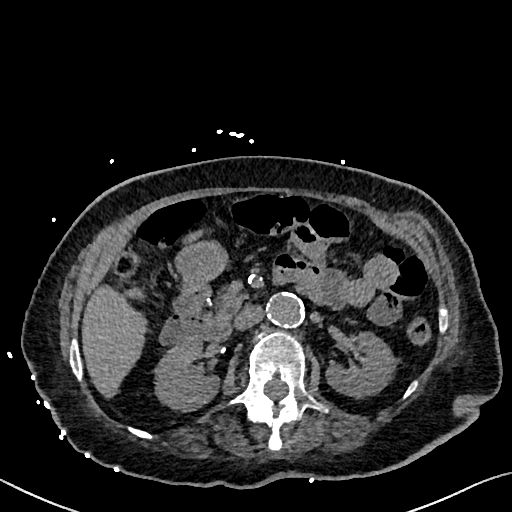
[im 86/601  lung]
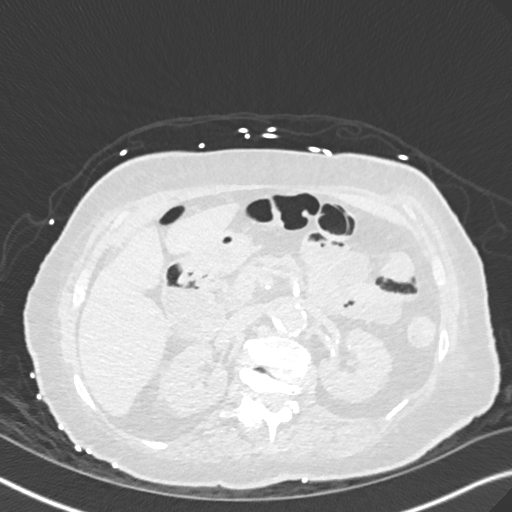
[im 143/601  mediastinal]
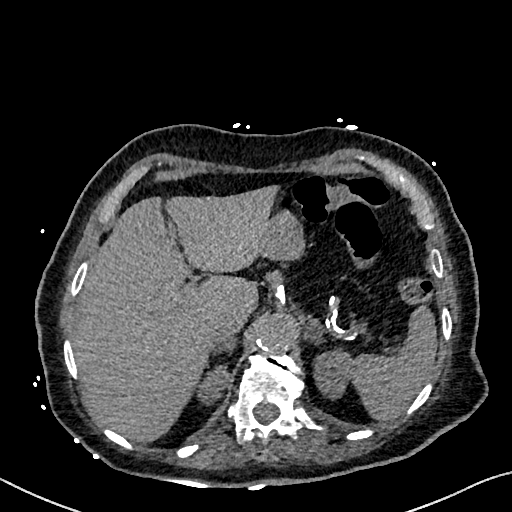
[im 172/601  lung]
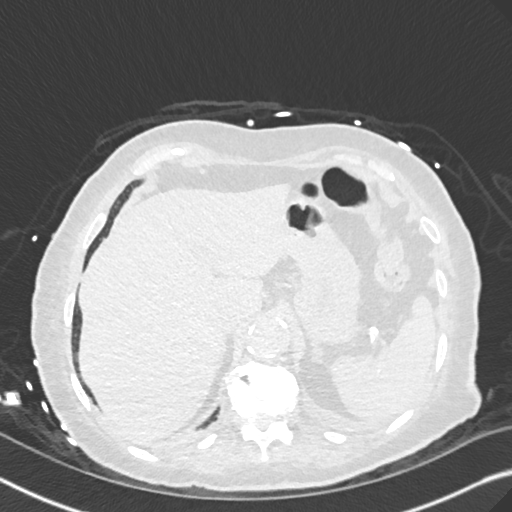
[im 201/601  mediastinal]
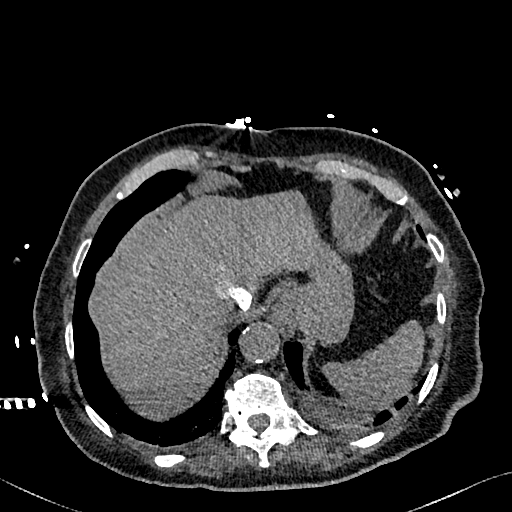
[im 229/601  lung]
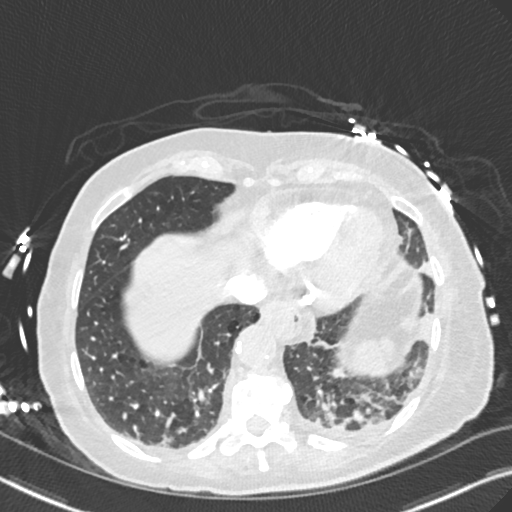
[im 258/601  mediastinal]
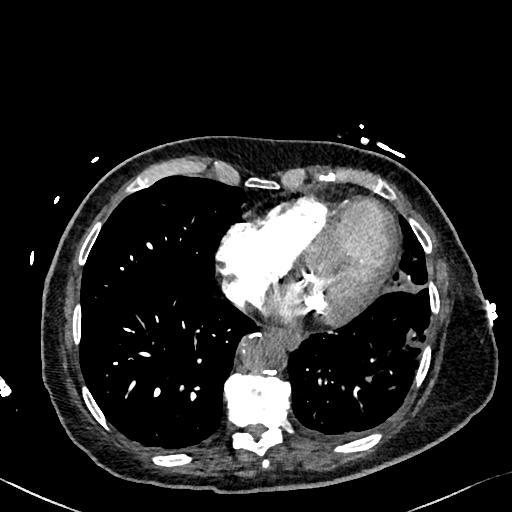
[im 315/601  lung]
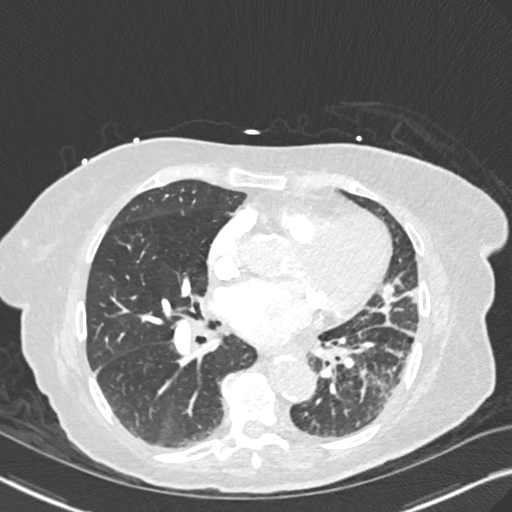
[im 343/601  mediastinal]
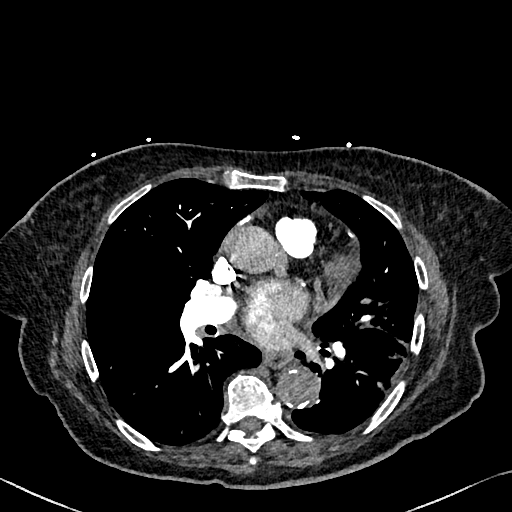
[im 372/601  lung]
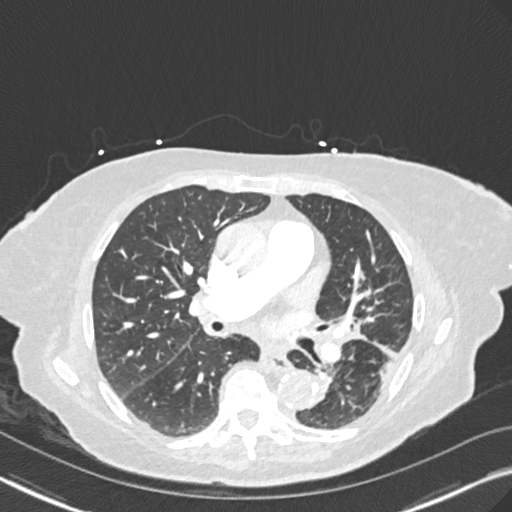
[im 401/601  mediastinal]
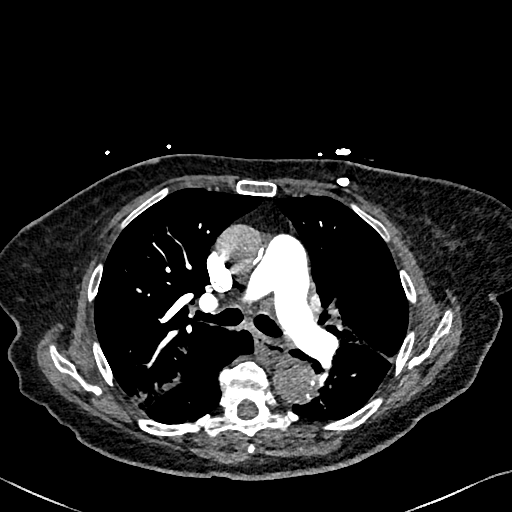
[im 429/601  lung]
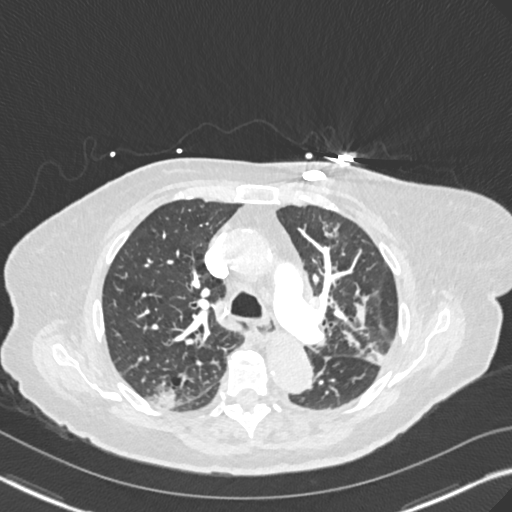
[im 458/601  mediastinal]
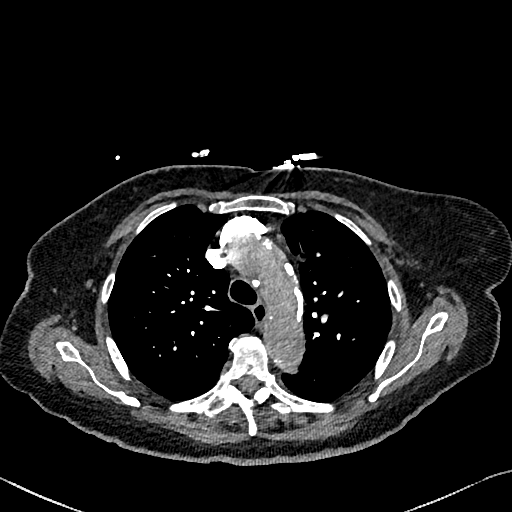
[im 515/601  lung]
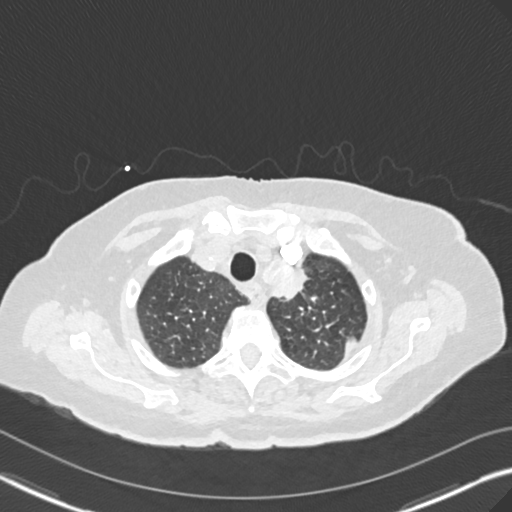
[im 543/601  mediastinal]
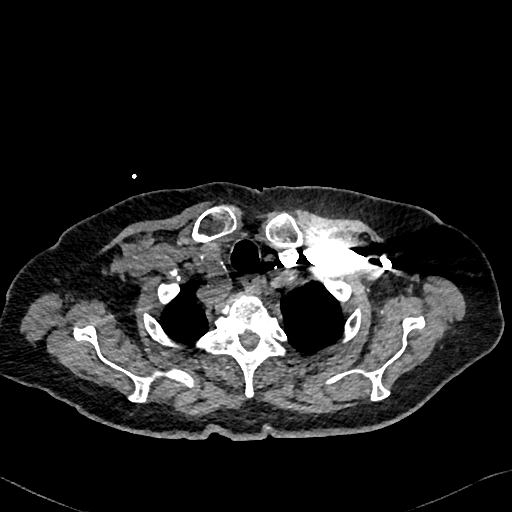
[im 572/601  lung]
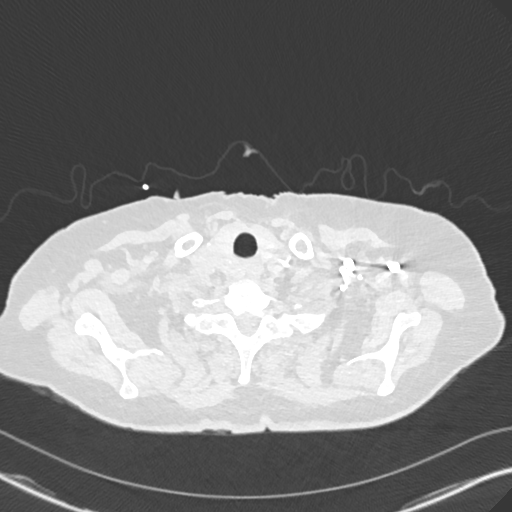

[Series 7: pe 2mm cor · coronal · 0.57mm/px · 1 of 115 slices shown]
[im 58/115  mediastinal]
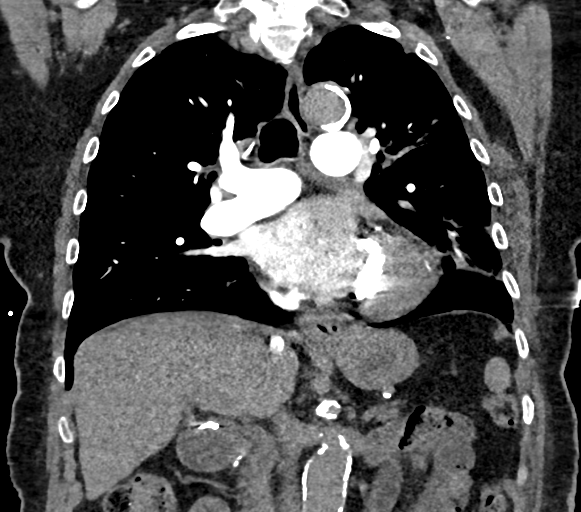

[18 of 36 positions shown; findings below may reference images not displayed]

FINDINGS: There are no filling defects within the pulmonary arteries to
suggest pulmonary embolus.

Atherosclerosis throughout the thoracic aorta, no aneurysm. There
are coronary artery calcifications. Dense mitral annulus
calcifications. No pericardial effusion. No mediastinal or hilar
adenopathy.

Severe bronchial thickening, most prominent in the lower lobes, left
greater than right. Ill-defined linear and patchy opacities in the
lingula and left lower lobe. Ill-defined opacities in the posterior
upper lobes, left greater than right, some of which appear nodular.
Within the paramediastinal left lung apex with a 2.1 x 1.5 cm
confluent opacity that abuts the left subclavian artery. Calcified
granuloma in the right upper lobe, image 30. There is a small left
pleural effusion. No definite right pleural effusion.

Extensive atherosclerosis of the included upper abdominal
vasculature. Calcified granuloma in the liver. Subcutaneous lipoma
involving the left lateral lower chest wall, measures 6.9 x 3.4 cm.
Small to moderate hiatal hernia. Implanted monitoring device in the
anterior left chest wall.

Advanced multilevel degenerative change throughout the thoracic
spine. There are no acute or suspicious osseous abnormalities.

Review of the MIP images confirms the above findings.
IMPRESSION: 1. No pulmonary embolus.
2. Severe bronchial thickening with multifocal patchy, confluent,
and linear opacities throughout all lobes both lungs, left greater
than right. Findings are most significant in the left lung base
involving the lingula and lower lobe. Suspect bronchitis with
multifocal pneumonia, however some of these have a nodular
appearance. The confluent left apical opacity abuts the mediastinum,
and attention to this at follow-up is recommended. Recommend
follow-up chest CT in 3-4 months.
3. Severe atherosclerosis including the coronary arteries.
